# Patient Record
Sex: Female | Born: 1950
Health system: Southern US, Community
[De-identification: ages and names within clinical notes are randomized; demographics above are authoritative.]

## PROBLEM LIST (undated history)

## (undated) DIAGNOSIS — R011 Cardiac murmur, unspecified: Secondary | ICD-10-CM

## (undated) DIAGNOSIS — Z8701 Personal history of pneumonia (recurrent): Secondary | ICD-10-CM

## (undated) DIAGNOSIS — I739 Peripheral vascular disease, unspecified: Secondary | ICD-10-CM

## (undated) DIAGNOSIS — J309 Allergic rhinitis, unspecified: Secondary | ICD-10-CM

## (undated) DIAGNOSIS — J189 Pneumonia, unspecified organism: Secondary | ICD-10-CM

## (undated) DIAGNOSIS — E785 Hyperlipidemia, unspecified: Secondary | ICD-10-CM

## (undated) DIAGNOSIS — Z889 Allergy status to unspecified drugs, medicaments and biological substances status: Secondary | ICD-10-CM

## (undated) DIAGNOSIS — K219 Gastro-esophageal reflux disease without esophagitis: Secondary | ICD-10-CM

## (undated) HISTORY — DX: Cardiac murmur, unspecified: R01.1

## (undated) HISTORY — PX: ABDOMINAL HYSTERECTOMY: SHX81

## (undated) HISTORY — DX: Allergic rhinitis, unspecified: J30.9

## (undated) HISTORY — DX: Personal history of pneumonia (recurrent): Z87.01

## (undated) HISTORY — PX: OTHER SURGICAL HISTORY: SHX169

## (undated) HISTORY — PX: CHOLECYSTECTOMY: SHX55

## (undated) HISTORY — PX: OOPHORECTOMY: SHX86

---

## 2002-03-25 ENCOUNTER — Ambulatory Visit (HOSPITAL_COMMUNITY): Admission: RE | Admit: 2002-03-25 | Discharge: 2002-03-25 | Payer: Self-pay | Admitting: Otolaryngology

## 2002-03-25 ENCOUNTER — Encounter: Payer: Self-pay | Admitting: Otolaryngology

## 2002-08-15 ENCOUNTER — Ambulatory Visit (HOSPITAL_COMMUNITY): Admission: RE | Admit: 2002-08-15 | Discharge: 2002-08-15 | Payer: Self-pay | Admitting: Family Medicine

## 2002-08-15 ENCOUNTER — Encounter: Payer: Self-pay | Admitting: Family Medicine

## 2003-04-25 ENCOUNTER — Ambulatory Visit (HOSPITAL_COMMUNITY): Admission: RE | Admit: 2003-04-25 | Discharge: 2003-04-25 | Payer: Self-pay | Admitting: Family Medicine

## 2003-04-25 ENCOUNTER — Encounter: Payer: Self-pay | Admitting: Family Medicine

## 2003-06-06 ENCOUNTER — Emergency Department (HOSPITAL_COMMUNITY): Admission: EM | Admit: 2003-06-06 | Discharge: 2003-06-07 | Payer: Self-pay | Admitting: Emergency Medicine

## 2003-06-07 ENCOUNTER — Encounter: Payer: Self-pay | Admitting: Emergency Medicine

## 2003-06-09 ENCOUNTER — Encounter: Payer: Self-pay | Admitting: Sports Medicine

## 2003-06-09 ENCOUNTER — Ambulatory Visit (HOSPITAL_COMMUNITY): Admission: RE | Admit: 2003-06-09 | Discharge: 2003-06-09 | Payer: Self-pay | Admitting: Sports Medicine

## 2003-07-25 ENCOUNTER — Encounter (HOSPITAL_COMMUNITY): Admission: RE | Admit: 2003-07-25 | Discharge: 2003-08-24 | Payer: Self-pay | Admitting: *Deleted

## 2011-01-19 ENCOUNTER — Encounter: Payer: Self-pay | Admitting: Family Medicine

## 2011-06-05 ENCOUNTER — Encounter: Payer: Self-pay | Admitting: Internal Medicine

## 2011-06-05 ENCOUNTER — Ambulatory Visit (INDEPENDENT_AMBULATORY_CARE_PROVIDER_SITE_OTHER): Payer: 59 | Admitting: Internal Medicine

## 2011-06-05 VITALS — BP 160/80 | HR 86 | Temp 97.8°F | Ht 63.5 in | Wt 158.0 lb

## 2011-06-05 DIAGNOSIS — R509 Fever, unspecified: Secondary | ICD-10-CM | POA: Insufficient documentation

## 2011-06-05 LAB — CBC WITH DIFFERENTIAL/PLATELET
Basophils Absolute: 0 10*3/uL (ref 0.0–0.1)
Basophils Relative: 0 % (ref 0–1)
Eosinophils Absolute: 0.1 10*3/uL (ref 0.0–0.7)
Eosinophils Relative: 1 % (ref 0–5)
HCT: 45.6 % (ref 36.0–46.0)
Hemoglobin: 14.8 g/dL (ref 12.0–15.0)
Lymphocytes Relative: 24 % (ref 12–46)
Lymphs Abs: 2.9 10*3/uL (ref 0.7–4.0)
MCH: 30.2 pg (ref 26.0–34.0)
MCHC: 32.5 g/dL (ref 30.0–36.0)
MCV: 93.1 fL (ref 78.0–100.0)
Monocytes Absolute: 0.9 10*3/uL (ref 0.1–1.0)
Monocytes Relative: 7 % (ref 3–12)
Neutro Abs: 8.1 10*3/uL — ABNORMAL HIGH (ref 1.7–7.7)
Neutrophils Relative %: 68 % (ref 43–77)
Platelets: 327 10*3/uL (ref 150–400)
RBC: 4.9 MIL/uL (ref 3.87–5.11)
RDW: 12.8 % (ref 11.5–15.5)
WBC: 12 10*3/uL — ABNORMAL HIGH (ref 4.0–10.5)

## 2011-06-05 NOTE — Progress Notes (Signed)
  Subjective:    Patient ID: [name], female    DOB: 1951-09-10, 60 y.o.   MRN: [id number]  HPI 60 yo female with hx of GERD, H pylori, hyperlipidemia here with compliant of subjective fever, chills, sweating at night. All symptoms are new.  Does not take temperature.  Also c/o abdominal cramping for same time period.  No weight loss.  Had a tick bite at the time on leg with large red lesion and since has had spots on legs and arms that have been pruritic.  Has had a positive IgM for RMSF and completed a full course and is completing a second course.  Symptoms have had some improvement with Doxy, but not completely and returned.  No sick contacts, no travel.     Review of Systems  Constitutional: Positive for fever and chills. Negative for activity change, appetite change, fatigue and unexpected weight change.  HENT: Negative.  Negative for neck pain and neck stiffness.   Eyes: Negative.   Respiratory: Negative.   Cardiovascular: Negative.   Gastrointestinal: Positive for abdominal pain. Negative for nausea, vomiting, diarrhea, blood in stool and abdominal distention.  Genitourinary: Negative.   Musculoskeletal: Negative.   Neurological: Negative.   Hematological: Negative.   Psychiatric/Behavioral: Negative.        Objective:   Physical Exam  Constitutional: She is oriented to person, place, and time. She appears well-developed and well-nourished. No distress.  HENT:  Mouth/Throat: Oropharynx is clear and moist. No oropharyngeal exudate.  Eyes: Right eye exhibits no discharge. Left eye exhibits no discharge. No scleral icterus.  Neck: Normal range of motion. Neck supple.  Cardiovascular: Normal rate, regular rhythm and normal heart sounds.   No murmur heard. Pulmonary/Chest: Effort normal and breath sounds normal. No respiratory distress. She has no wheezes.  Abdominal: Soft. Bowel sounds are normal. She exhibits no distension. There is no tenderness. There is no rebound and no  guarding.  Musculoskeletal: Normal range of motion. She exhibits no edema.  Lymphadenopathy:    She has no cervical adenopathy.  Neurological: She is alert and oriented to person, place, and time. No cranial nerve deficit.  Skin: Skin is warm and dry.       + small petechial lesions on left arm, left leg. None on back, palms, soles.    Psychiatric: She has a normal mood and affect. Her behavior is normal.          Assessment & Plan:

## 2011-06-05 NOTE — Assessment & Plan Note (Signed)
Unclear etiology.  Patient is going to get a thermometer and check temp when she has a fever to see how high.  I will contact patient to find out.  I suspect she had a cellulitis at site of tick bites on leg (first) then abd because she picks/scratches them and describes a significant redness.  Petechial lesions unclear but possibly RMSF.  Pt has been treated adequately.  Other etiologies could be IBS for her abdominal cramping, less likely a lymphoma with night sweats and fever just at night.  Will check a CBC, LDH.  No epidemiology for TB.  Pt to follow up if no improvement.

## 2011-06-06 LAB — COMPLETE METABOLIC PANEL WITH GFR
ALT: 21 U/L (ref 0–35)
AST: 18 U/L (ref 0–37)
Albumin: 4.7 g/dL (ref 3.5–5.2)
Alkaline Phosphatase: 62 U/L (ref 39–117)
BUN: 10 mg/dL (ref 6–23)
CO2: 23 mEq/L (ref 19–32)
Calcium: 10.1 mg/dL (ref 8.4–10.5)
Chloride: 102 mEq/L (ref 96–112)
Creat: 0.63 mg/dL (ref 0.50–1.10)
GFR, Est African American: >60 mL/min (ref >60)
GFR, Est Non African American: >60 mL/min (ref >60)
Glucose, Bld: 97 mg/dL (ref 70–99)
Potassium: 5 mEq/L (ref 3.5–5.3)
Sodium: 137 mEq/L (ref 135–145)
Total Bilirubin: 0.4 mg/dL (ref 0.3–1.2)
Total Protein: 7 g/dL (ref 6.0–8.3)

## 2011-06-06 LAB — C-REACTIVE PROTEIN: CRP: 1.7 mg/dL — ABNORMAL HIGH (ref <0.6)

## 2011-06-09 ENCOUNTER — Telehealth: Payer: Self-pay | Admitting: *Deleted

## 2011-06-09 LAB — LACTATE DEHYDROGENASE, ISOENZYMES
LDH 1: 23 % (ref 19–38)
LDH 2: 34 % (ref 30–43)
LDH Isoenzymes, Total: 145 U/L (ref 120–250)

## 2011-06-09 NOTE — Telephone Encounter (Signed)
She called from [phone] asking for labs. Told her wbc & crp slightly elevated. Will check with Dr. Luciana Axe & call her back. States she is finsished with antibiotics

## 2011-11-26 ENCOUNTER — Emergency Department (HOSPITAL_COMMUNITY): Payer: 59

## 2011-11-26 ENCOUNTER — Encounter (HOSPITAL_COMMUNITY): Payer: Self-pay | Admitting: *Deleted

## 2011-11-26 ENCOUNTER — Emergency Department (HOSPITAL_COMMUNITY)
Admission: EM | Admit: 2011-11-26 | Discharge: 2011-11-27 | Disposition: A | Discharge status: Home / Self Care | Payer: 59 | Attending: Emergency Medicine | Admitting: Emergency Medicine

## 2011-11-26 DIAGNOSIS — I498 Other specified cardiac arrhythmias: Secondary | ICD-10-CM | POA: Insufficient documentation

## 2011-11-26 DIAGNOSIS — T7840XA Allergy, unspecified, initial encounter: Secondary | ICD-10-CM

## 2011-11-26 DIAGNOSIS — R197 Diarrhea, unspecified: Secondary | ICD-10-CM | POA: Insufficient documentation

## 2011-11-26 DIAGNOSIS — L539 Erythematous condition, unspecified: Secondary | ICD-10-CM | POA: Insufficient documentation

## 2011-11-26 DIAGNOSIS — R109 Unspecified abdominal pain: Secondary | ICD-10-CM | POA: Insufficient documentation

## 2011-11-26 DIAGNOSIS — F17200 Nicotine dependence, unspecified, uncomplicated: Secondary | ICD-10-CM | POA: Insufficient documentation

## 2011-11-26 DIAGNOSIS — I10 Essential (primary) hypertension: Secondary | ICD-10-CM | POA: Insufficient documentation

## 2011-11-26 DIAGNOSIS — N9489 Other specified conditions associated with female genital organs and menstrual cycle: Secondary | ICD-10-CM | POA: Insufficient documentation

## 2011-11-26 DIAGNOSIS — R112 Nausea with vomiting, unspecified: Secondary | ICD-10-CM | POA: Insufficient documentation

## 2011-11-26 DIAGNOSIS — L299 Pruritus, unspecified: Secondary | ICD-10-CM | POA: Insufficient documentation

## 2011-11-26 LAB — BASIC METABOLIC PANEL
BUN: 12 mg/dL (ref 6–23)
Calcium: 9.2 mg/dL (ref 8.4–10.5)
GFR calc non Af Amer: 69 mL/min — ABNORMAL LOW (ref >90)
Glucose, Bld: 125 mg/dL — ABNORMAL HIGH (ref 70–99)

## 2011-11-26 LAB — CBC
HCT: 41.4 % (ref 36.0–46.0)
Hemoglobin: 13.9 g/dL (ref 12.0–15.0)
MCH: 30.5 pg (ref 26.0–34.0)
MCHC: 33.6 g/dL (ref 30.0–36.0)

## 2011-11-26 LAB — DIFFERENTIAL
Basophils Relative: 0 % (ref 0–1)
Eosinophils Absolute: 0 10*3/uL (ref 0.0–0.7)
Eosinophils Relative: 0 % (ref 0–5)
Monocytes Absolute: 0.9 10*3/uL (ref 0.1–1.0)
Monocytes Relative: 4 % (ref 3–12)

## 2011-11-26 MED ORDER — METHYLPREDNISOLONE SODIUM SUCC 125 MG IJ SOLR
125.0000 mg | Freq: Once | INTRAMUSCULAR | Status: AC
  Administered 2011-11-26: 125 mg via INTRAVENOUS
  Filled 2011-11-26: qty 2

## 2011-11-26 MED ORDER — DIPHENHYDRAMINE HCL 50 MG/ML IJ SOLN
25.0000 mg | Freq: Once | INTRAMUSCULAR | Status: AC
  Administered 2011-11-26: 50 mg via INTRAVENOUS
  Filled 2011-11-26: qty 1

## 2011-11-26 MED ORDER — FAMOTIDINE IN NACL 20-0.9 MG/50ML-% IV SOLN
20.0000 mg | Freq: Once | INTRAVENOUS | Status: AC
  Administered 2011-11-26: 20 mg via INTRAVENOUS
  Filled 2011-11-26: qty 50

## 2011-11-26 MED ORDER — SODIUM CHLORIDE 0.9 % IV BOLUS (SEPSIS)
1000.0000 mL | Freq: Once | INTRAVENOUS | Status: AC
  Administered 2011-11-26: 1000 mL via INTRAVENOUS

## 2011-11-26 NOTE — ED Notes (Signed)
Pt red from head to toe no raised areas; c/o's itching; No resp distress noted

## 2011-11-26 NOTE — ED Provider Notes (Signed)
History    This chart was scribed for [employer]. Colon Branch, MD, MD by Smitty Pluck. The patient was seen in room APA14 and the patient's care was started at 10:06PM.   CSN: [id number] Arrival date & time: 11/26/2011  8:58 PM   First MD Initiated Contact with Patient 11/26/11 2100      Chief Complaint  Patient presents with  . Allergic Reaction    (Consider location/radiation/quality/duration/timing/severity/associated sxs/prior treatment) The history is provided by the patient and the spouse.   [name] is a 60 y.o. female who presents to the Emergency Department complaining of moderate to severe allergic reaction with sudden onset several hours ago and improving since. Pt reports that symptoms started after eating [employer] and hot wings. She mentions being nauseated with vomiting for 30 minutes, and becoming diffusely flush, SOB, dizzy and experiencing diarrhea, pruritus and numbness followed by a syncopal episode in which patient struck the left side of her head on the commode as she fell. She reports no change in diet or recent new exposures. Pt denies hives. States she had [facility] Spotted fever earlier this year. Also, she says she is allergic to red dye, levaquin and biaxin.   History reviewed. No pertinent past medical history.  History reviewed. No pertinent past surgical history.  No family history on file.  History  Substance Use Topics  . Smoking status: Current Everyday Smoker -- 2.0 packs/day for 20 years    Types: Cigarettes  . Smokeless tobacco: Never Used  . Alcohol Use: No    OB History    Grav Para Term Preterm Abortions TAB SAB Ect Mult Living                  Review of Systems  All other systems reviewed and are negative.  10 Systems reviewed and are negative for acute change except as noted in the HPI.   Allergies  Biaxin; Levaquin; and Red dye  Home Medications   Current Outpatient Rx  Name Route Sig Dispense Refill  .  CETIRIZINE HCL 10 MG PO TABS Oral Take 10 mg by mouth at bedtime.     Marland Kitchen NAPROXEN 500 MG PO TABS Oral Take 500 mg by mouth daily as needed. For knee pain     . ATORVASTATIN CALCIUM 20 MG PO TABS Oral Take 20 mg by mouth daily.      Marland Kitchen DOXYCYCLINE HYCLATE 100 MG PO TABS Oral Take 100 mg by mouth 2 (two) times daily.      Marland Kitchen MUPIROCIN 2 % EX OINT      . SIMVASTATIN 20 MG PO TABS        BP 92/52  Pulse 92  Resp 20  SpO2 91%  Physical Exam  Nursing note and vitals reviewed. Constitutional: She is oriented to person, place, and time. She appears well-developed and well-nourished. No distress.  HENT:  Head: Normocephalic and atraumatic.  Right Ear: External ear normal.  Left Ear: External ear normal.  Eyes: EOM are normal. Pupils are equal, round, and reactive to light.  Neck: Normal range of motion. Neck supple. No tracheal deviation present.  Cardiovascular: Normal rate, regular rhythm and normal heart sounds.  Exam reveals no friction rub.   No murmur heard. Pulmonary/Chest: Effort normal. She has wheezes.       Basilar crackles  Occasional end expiratory wheeze  Abdominal: Soft. There is no tenderness.       Hyperactive bowel sounds  Neurological: She is alert and  oriented to person, place, and time.  Skin: Skin is warm and dry.       Diffusely erythema  all over body with no hives  Psychiatric: She has a normal mood and affect. Her behavior is normal.    ED Course  Procedures (including critical care time)  DIAGNOSTIC STUDIES:  Date: 11/26/2011 2054  Rate: 112  Rhythm: sinus tachycardia  QRS Axis: normal  Intervals: normal  ST/T Wave abnormalities: normal  Conduction Disutrbances:none  Narrative Interpretation:   Old EKG Reviewed: none available Results for orders placed during the hospital encounter of 11/26/11  URINALYSIS, ROUTINE W REFLEX MICROSCOPIC      Component Value Range   Color, Urine AMBER (*) YELLOW    APPearance CLOUDY (*) CLEAR    Specific Gravity, Urine  1.010  1.005 - 1.030    pH 6.0  5.0 - 8.0    Glucose, UA NEGATIVE  NEGATIVE (mg/dL)   Hgb urine dipstick TRACE (*) NEGATIVE    Bilirubin Urine NEGATIVE  NEGATIVE    Ketones, ur NEGATIVE  NEGATIVE (mg/dL)   Protein, ur 161 (*) NEGATIVE (mg/dL)   Urobilinogen, UA 0.2  0.0 - 1.0 (mg/dL)   Nitrite NEGATIVE  NEGATIVE    Leukocytes, UA NEGATIVE  NEGATIVE   CBC      Component Value Range   WBC 27.1 (*) 4.0 - 10.5 (K/uL)   RBC 4.56  3.87 - 5.11 (MIL/uL)   Hemoglobin 13.9  12.0 - 15.0 (g/dL)   HCT 09.6  04.5 - 40.9 (%)   MCV 90.8  78.0 - 100.0 (fL)   MCH 30.5  26.0 - 34.0 (pg)   MCHC 33.6  30.0 - 36.0 (g/dL)   RDW 81.1  91.4 - 78.2 (%)   Platelets 296  150 - 400 (K/uL)  DIFFERENTIAL      Component Value Range   Neutrophils Relative 88 (*) 43 - 77 (%)   Neutro Abs 23.8 (*) 1.7 - 7.7 (K/uL)   Lymphocytes Relative 8 (*) 12 - 46 (%)   Lymphs Abs 2.3  0.7 - 4.0 (K/uL)   Monocytes Relative 4  3 - 12 (%)   Monocytes Absolute 0.9  0.1 - 1.0 (K/uL)   Eosinophils Relative 0  0 - 5 (%)   Eosinophils Absolute 0.0  0.0 - 0.7 (K/uL)   Basophils Relative 0  0 - 1 (%)   Basophils Absolute 0.0  0.0 - 0.1 (K/uL)   WBC Morphology INCREASED BANDS (>20% BANDS)    BASIC METABOLIC PANEL      Component Value Range   Sodium 139  135 - 145 (mEq/L)   Potassium 3.3 (*) 3.5 - 5.1 (mEq/L)   Chloride 106  96 - 112 (mEq/L)   CO2 24  19 - 32 (mEq/L)   Glucose, Bld 125 (*) 70 - 99 (mg/dL)   BUN 12  6 - 23 (mg/dL)   Creatinine, Ser 9.56  0.50 - 1.10 (mg/dL)   Calcium 9.2  8.4 - 21.3 (mg/dL)   GFR calc non Af Amer 69 (*) >90 (mL/min)   GFR calc Af Amer 81 (*) >90 (mL/min)  URINE MICROSCOPIC-ADD ON      Component Value Range   Squamous Epithelial / LPF FEW (*) RARE    WBC, UA 0-2  <3 (WBC/hpf)   RBC / HPF 3-6  <3 (RBC/hpf)   Bacteria, UA FEW (*) RARE    Casts HYALINE CASTS (*) NEGATIVE      COORDINATION OF CARE: 2300 Patient has  had a liter of IVF, benadryl, pepcid and solumedrol. Erythema has resolved.  Now with cramping abdominal pain. Labs with marked leukocytosis that is not c/w allergic reaction. With the h/o nausea, vomiting and diarrhea, will order a CT . 0040 Care/dispostion to Dr. Juleen China.    MDM  Patient who presents with nausea, vomiting, erythema, pruritis, diarrhea after eating a meal she has had before. Labs with a marked leukocytosis. Treated for allergic reaction with IVF, solumedrol, pepcid and benadryl with resolution of the erythema and pruritis. No further nausea, vomiting or diarrhea.  CT ordered and pending.  I personally performed the services described in this documentation, which was scribed in my presence. The recorded information has been reviewed and considered.  MDM Reviewed: nursing note and vitals Interpretation: labs Total time providing critical care: 40.       Nicoletta Dress. Colon Branch, MD 11/27/11 4098

## 2011-11-26 NOTE — ED Notes (Signed)
Patient states she ate at [employer] at 1800 and at 7pm, began vomiting and becoming red.

## 2011-11-27 ENCOUNTER — Emergency Department (HOSPITAL_COMMUNITY): Payer: 59

## 2011-11-27 ENCOUNTER — Encounter (HOSPITAL_COMMUNITY): Payer: Self-pay | Admitting: Emergency Medicine

## 2011-11-27 LAB — URINALYSIS, ROUTINE W REFLEX MICROSCOPIC
Bilirubin Urine: NEGATIVE
Glucose, UA: NEGATIVE mg/dL
Ketones, ur: NEGATIVE mg/dL
Nitrite: NEGATIVE
pH: 6 (ref 5.0–8.0)

## 2011-11-27 LAB — CA 125: CA 125: 7 U/mL (ref 0.0–30.2)

## 2011-11-27 LAB — HEPATIC FUNCTION PANEL
ALT: 17 U/L (ref 0–35)
Albumin: 3.4 g/dL — ABNORMAL LOW (ref 3.5–5.2)
Alkaline Phosphatase: 61 U/L (ref 39–117)
Total Protein: 6.5 g/dL (ref 6.0–8.3)

## 2011-11-27 LAB — LIPASE, BLOOD: Lipase: 47 U/L (ref 11–59)

## 2011-11-27 MED ORDER — IOHEXOL 300 MG/ML  SOLN
100.0000 mL | Freq: Once | INTRAMUSCULAR | Status: AC | PRN
  Administered 2011-11-27: 100 mL via INTRAVENOUS

## 2011-11-27 MED ORDER — PROMETHAZINE HCL 25 MG PO TABS: 25.0000 mg | ORAL_TABLET | Freq: Four times a day (QID) | ORAL | Status: AC | PRN

## 2011-11-27 NOTE — ED Provider Notes (Signed)
4:42 AM Discussed CT results with pt including adnexal mass and concern for malignancy. Marked leukocytosis with >20% bands. This is concerning given pt's presentation, mild hypotension, hypoxia, etc. Pt reports back to her baseline and has been observed in ED for several hours with improvement. Would expect worsening or at least continued symptoms at this point if from infectious cause.  Leukocytosis may be related to pelvic mass but not sure if this is to be expected with a solid tumor. Will discuss with hem/onc for further advice.  5:11 AM Discussed with Dr Kendall Flack. Says that this degree of neutrophilia can be seen with solid tumors and that if clinical picture does not fit with infectious process then is likely cause. Pt and singificant other given strict DC instructions and what to return for immediate re-evaluation. GYN referral. CA-125 sent for benefit of follow-up provider.  Raeford Razor, MD 11/29/11 1145

## 2011-12-08 DIAGNOSIS — D4959 Neoplasm of unspecified behavior of other genitourinary organ: Secondary | ICD-10-CM | POA: Insufficient documentation

## 2011-12-12 DIAGNOSIS — J309 Allergic rhinitis, unspecified: Secondary | ICD-10-CM | POA: Insufficient documentation

## 2011-12-12 DIAGNOSIS — Z720 Tobacco use: Secondary | ICD-10-CM | POA: Insufficient documentation

## 2011-12-12 DIAGNOSIS — R0683 Snoring: Secondary | ICD-10-CM | POA: Insufficient documentation

## 2011-12-12 DIAGNOSIS — Z8719 Personal history of other diseases of the digestive system: Secondary | ICD-10-CM | POA: Insufficient documentation

## 2011-12-17 DIAGNOSIS — N9489 Other specified conditions associated with female genital organs and menstrual cycle: Secondary | ICD-10-CM | POA: Insufficient documentation

## 2012-01-22 DIAGNOSIS — D279 Benign neoplasm of unspecified ovary: Secondary | ICD-10-CM | POA: Insufficient documentation

## 2012-10-07 IMAGING — CT CT ABD-PELV W/ CM
2 of 5 series · 15 of 46 positions shown, 17 images · IV contrast (Omnipaque 300)
Comparison: None.

CLINICAL DATA: Abdominal pain, nausea, vomiting, flushing,
shortness of breath, dizziness, diarrhea and pruritis; syncope.
History of smoking.

CT ABDOMEN AND PELVIS WITH CONTRAST
TECHNIQUE: Multidetector CT imaging of the abdomen and pelvis was
performed following the standard protocol during bolus
administration of intravenous contrast.
Contrast: 100mL OMNIPAQUE IOHEXOL 300 MG/ML IV SOLN

[Series 2: abd_pel_with 5.0 b40f · axial · 0.66mm/px · z∈[-475,-80]mm · 12 of 91 slices shown, 14 images]
[im 6/91  soft-tissue]
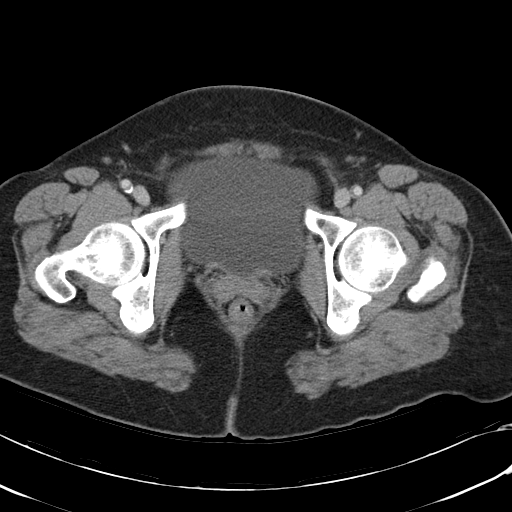
[im 6/91  bone]
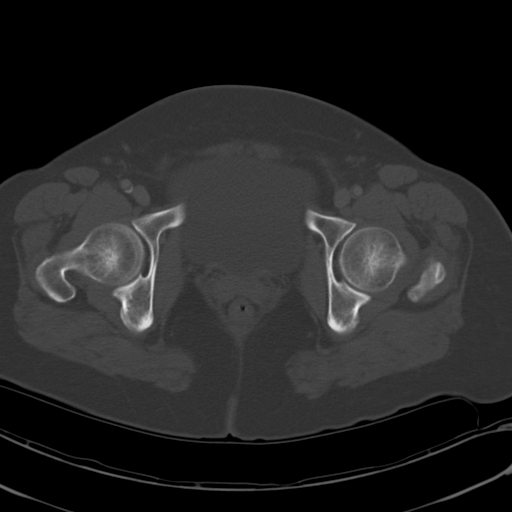
[im 16/91  soft-tissue]
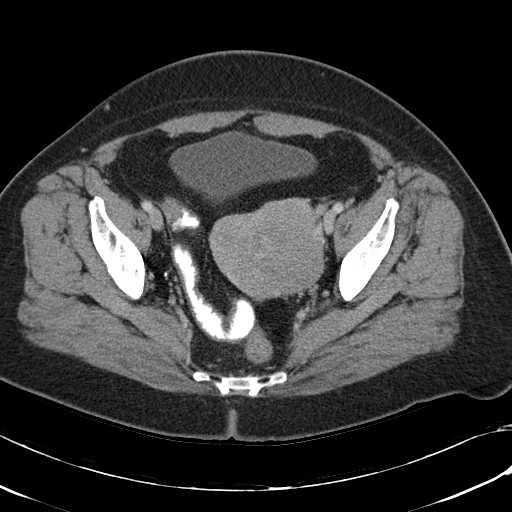
[im 22/91  soft-tissue]
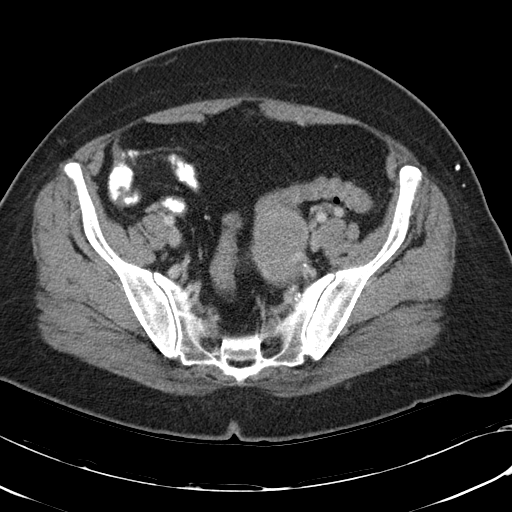
[im 27/91  soft-tissue]
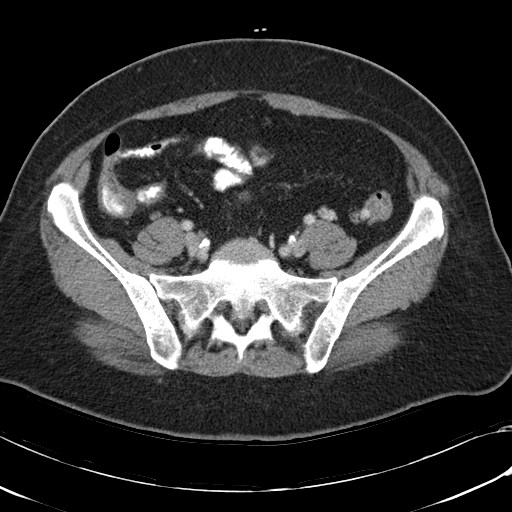
[im 38/91  soft-tissue]
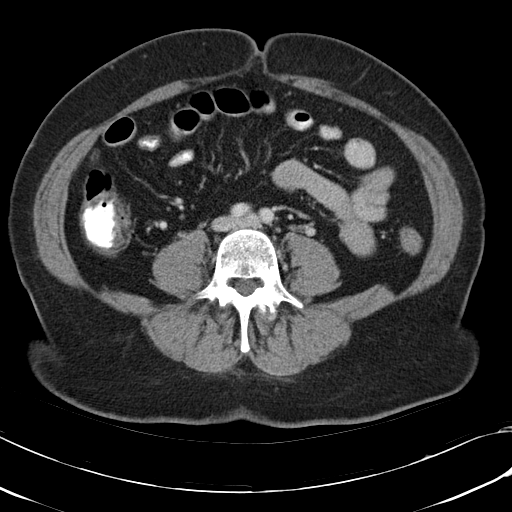
[im 43/91  soft-tissue]
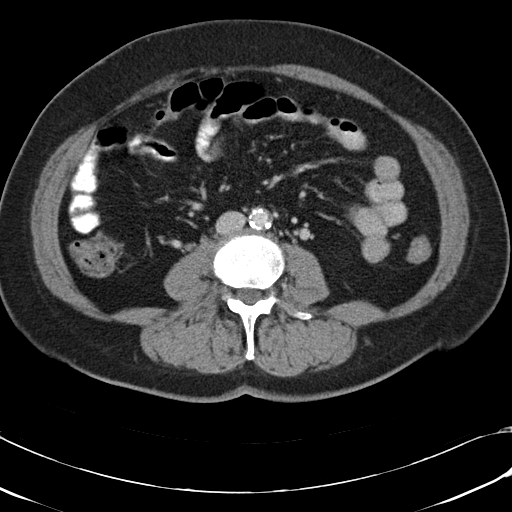
[im 48/91  soft-tissue]
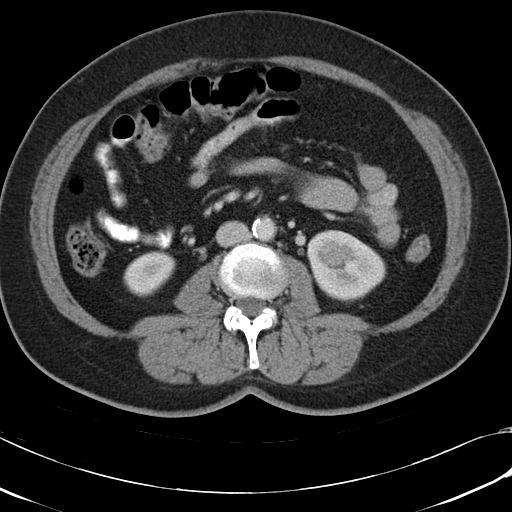
[im 59/91  soft-tissue]
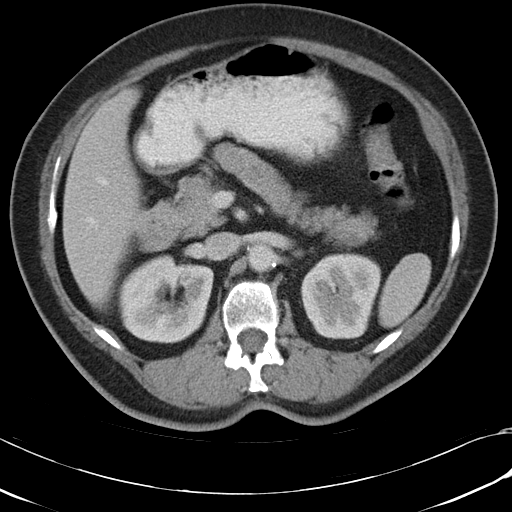
[im 64/91  soft-tissue]
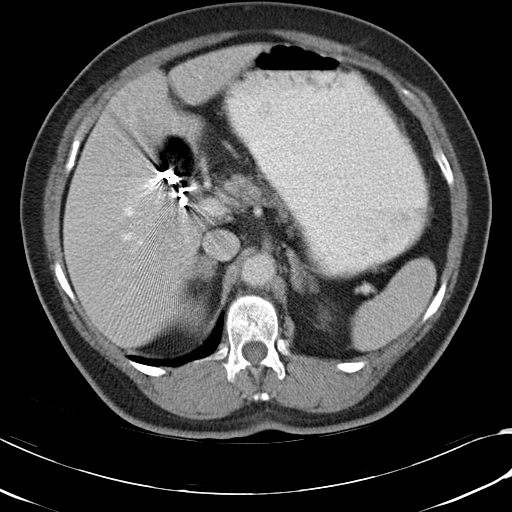
[im 64/91  bone]
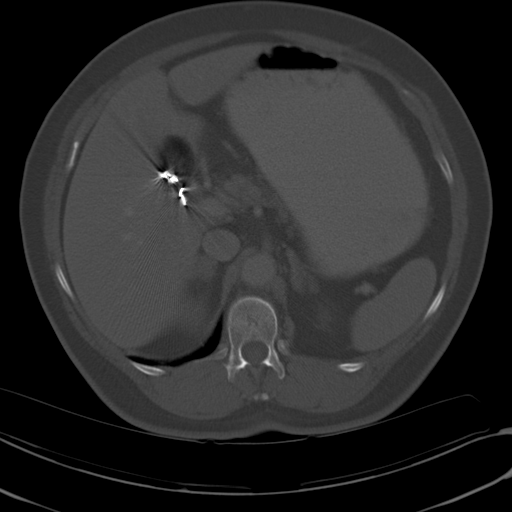
[im 69/91  soft-tissue]
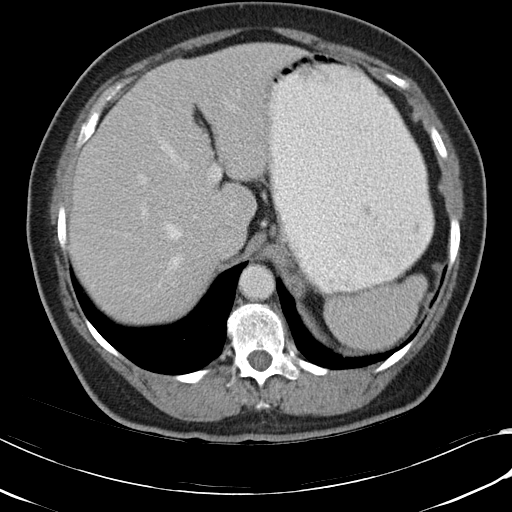
[im 80/91  soft-tissue]
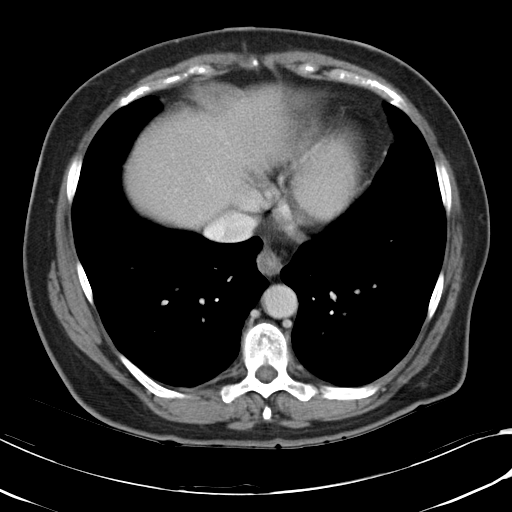
[im 85/91  soft-tissue]
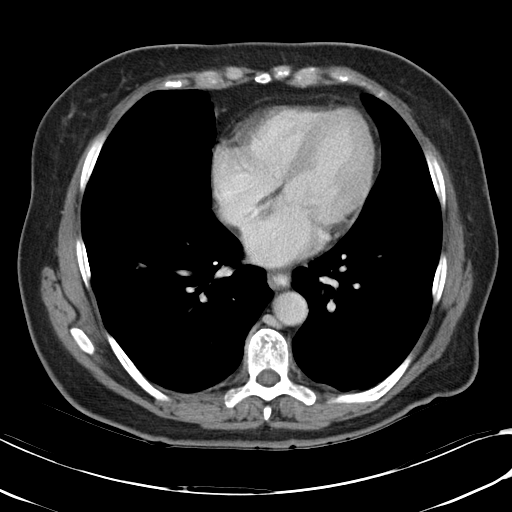

[Series 4: abd_pel_with 3.0 spo cor · coronal · 0.66mm/px · 3 of 95 slices shown]
[im 32/95  soft-tissue]
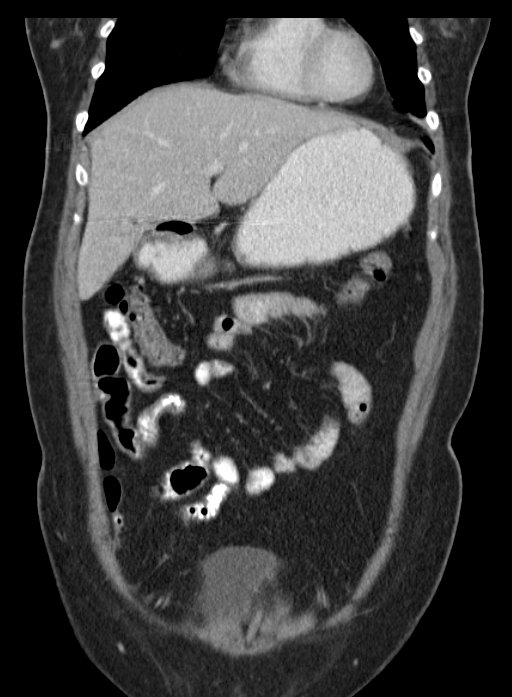
[im 42/95  soft-tissue]
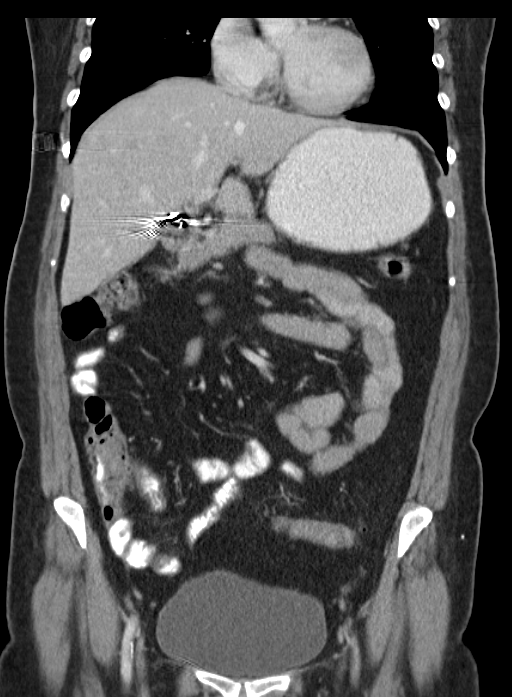
[im 53/95  soft-tissue]
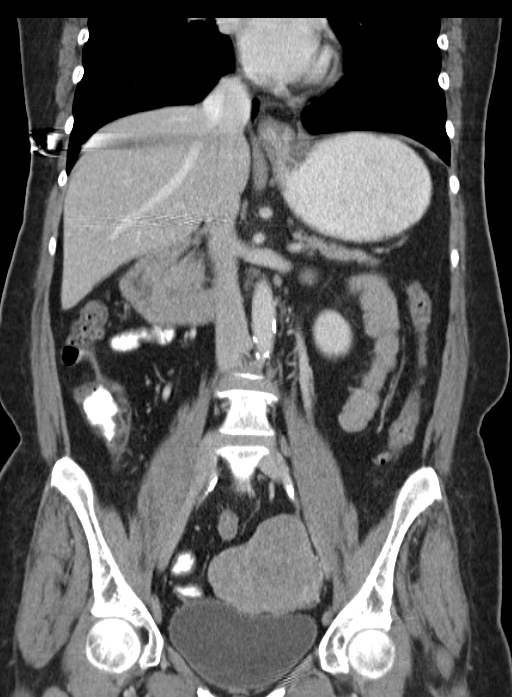

[15 of 46 positions shown; findings below may reference images not displayed]

FINDINGS: The visualized lung bases are clear.

The liver and spleen are unremarkable in appearance.  The patient
is status post cholecystectomy, with clips noted at the gallbladder
fossa.  A 1.8 cm mass is noted at the right adrenal gland; this
demonstrates greater than 50% relative washout on delayed imaging,
most likely reflecting an adrenal adenoma.

The pancreas is unremarkable in appearance.  The left adrenal gland
is within normal limits.

The kidneys are unremarkable in appearance.  There is no evidence
of hydronephrosis.  No renal or ureteral stones are seen.  No
perinephric stranding is appreciated.

The small bowel is unremarkable in appearance.  The stomach is
within normal limits.  No acute vascular abnormalities are seen.
Mild scattered calcification is noted along the abdominal aorta and
its branches.

The patient is status post appendectomy.  Scattered diverticulosis
is noted along the distal descending and proximal sigmoid colon,
without evidence of diverticulitis.  The colon is otherwise
unremarkable in appearance.

The bladder is moderately distended and grossly unremarkable in
appearance.

There is a large solid heterogeneous mass arising at the left
adnexa, measuring 7.4 x 5.6 x 6.5 cm, concerning for malignancy.
The right ovary is unremarkable in appearance.  The patient is
status post hysterectomy.  Trace free fluid within the pelvic cul-
de-sac may be physiologic in nature.  No inguinal lymphadenopathy
is seen.

No acute osseous abnormalities are identified.
IMPRESSION: 1.  Large solid heterogeneous left adnexal mass noted, measuring
7.4 x 5.6 x 6.5 cm, concerning for ovarian malignancy.
2.  Scattered diverticulosis along the distal descending proximal
sigmoid colon, without evidence of diverticulitis.
3.  Mild scattered calcification along the abdominal aorta and its
branches.
4.  1.8 cm right adrenal mass demonstrates greater than 50%
relative washout on delayed imaging, most likely reflecting a
benign adrenal adenoma.

## 2015-08-14 ENCOUNTER — Other Ambulatory Visit (HOSPITAL_COMMUNITY): Payer: Self-pay | Admitting: Internal Medicine

## 2015-08-14 ENCOUNTER — Ambulatory Visit (HOSPITAL_COMMUNITY)
Admission: RE | Admit: 2015-08-14 | Discharge: 2015-08-14 | Disposition: A | Discharge status: Home / Self Care | Payer: PPO | Source: Ambulatory Visit | Attending: Internal Medicine | Admitting: Internal Medicine

## 2015-08-14 DIAGNOSIS — Z1389 Encounter for screening for other disorder: Secondary | ICD-10-CM

## 2015-08-14 DIAGNOSIS — M25552 Pain in left hip: Secondary | ICD-10-CM | POA: Diagnosis not present

## 2015-08-22 ENCOUNTER — Other Ambulatory Visit (HOSPITAL_COMMUNITY): Payer: Self-pay | Admitting: Physician Assistant

## 2015-08-22 DIAGNOSIS — Z139 Encounter for screening, unspecified: Secondary | ICD-10-CM

## 2015-08-29 ENCOUNTER — Ambulatory Visit (HOSPITAL_COMMUNITY): Payer: PPO

## 2015-09-05 ENCOUNTER — Ambulatory Visit (HOSPITAL_COMMUNITY): Payer: PPO

## 2015-10-10 ENCOUNTER — Ambulatory Visit (HOSPITAL_COMMUNITY): Payer: PPO | Attending: Physical Medicine and Rehabilitation | Admitting: Physical Therapy

## 2015-10-10 DIAGNOSIS — M533 Sacrococcygeal disorders, not elsewhere classified: Secondary | ICD-10-CM | POA: Diagnosis present

## 2015-10-10 DIAGNOSIS — M25651 Stiffness of right hip, not elsewhere classified: Secondary | ICD-10-CM | POA: Insufficient documentation

## 2015-10-10 DIAGNOSIS — M25652 Stiffness of left hip, not elsewhere classified: Secondary | ICD-10-CM | POA: Diagnosis present

## 2015-10-10 DIAGNOSIS — M79605 Pain in left leg: Secondary | ICD-10-CM | POA: Diagnosis present

## 2015-10-10 DIAGNOSIS — M6289 Other specified disorders of muscle: Secondary | ICD-10-CM | POA: Insufficient documentation

## 2015-10-10 DIAGNOSIS — R198 Other specified symptoms and signs involving the digestive system and abdomen: Secondary | ICD-10-CM

## 2015-10-10 DIAGNOSIS — M6281 Muscle weakness (generalized): Secondary | ICD-10-CM
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
## 2015-10-10 NOTE — Patient Instructions (Signed)
   BRIDGING  While lying on your back, tighten your lower abdominals, squeeze your buttocks and then raise your buttocks off the floor/bed as creating a "Bridge" with your body.  Repeat 10 times, 2x/day.    STRAIGHT LEG RAISE - SLR  While lying or sitting, raise up your leg with a straight knee.  Keep the opposite knee bent with the foot planted to the ground.  Repeat 10 times each leg, 2x/day.    HIP ABDUCTION - SIDELYING  While lying on your side, slowly raise up your top leg to the side. Keep your knee straight and maintain your toes pointed forward the entire time.   The bottom leg can be bent to stabilize your body.  Repeat 10 times each leg, 2x/day.    PIRIFORMIS AND HIP STRETCH - SEATED  While sitting in a chair, cross your affected leg on top of the other as shown.   Next, gently lean forward until a stretch is felt along the crossed leg.  Repeat 3 times each leg with 30 second holds, 2x/day.

## 2015-10-22 ENCOUNTER — Ambulatory Visit (HOSPITAL_COMMUNITY): Payer: PPO | Admitting: Physical Therapy

## 2015-11-05 ENCOUNTER — Ambulatory Visit (HOSPITAL_COMMUNITY): Payer: PPO | Attending: Physical Medicine and Rehabilitation | Admitting: Physical Therapy

## 2015-11-19 ENCOUNTER — Ambulatory Visit (HOSPITAL_COMMUNITY): Payer: PPO | Admitting: Physical Therapy

## 2015-11-19 ENCOUNTER — Telehealth (HOSPITAL_COMMUNITY): Payer: Self-pay | Admitting: Physical Therapy

## 2015-11-19 NOTE — Telephone Encounter (Signed)
Patient a no-show for today's session. She reports that she forgot session but also states that her MD found that she has DDD in her spine which they think might be contributing to her leg pain, and that she is being sent to a back doctor to find out what her options are. She would like to hold PT for now, and is OK with being discharged for now; she was educated that she will need new MD order to resume skilled PT services.  Nedra HaiKristen Unger PT, DPT [phone]

## 2015-11-19 NOTE — Therapy (Signed)
[place], [place], [address] Phone: [phone]   Fax:  [phone]  Patient Details  Name: [name] MRN: [id number] Date of Birth: 06/05/1951 Referring Provider:  Suella Broad, MD  Encounter Date: 11/19/2015  PHYSICAL THERAPY DISCHARGE SUMMARY  Visits from Start of Care: 1  Current functional level related to goals / functional outcomes: Patient reports that she would like to be discharged from PT for now as MD found DDD in her back and she'd rather just wait and see the back MD before she does anything else.    Remaining deficits: Unable to assess    Education / Equipment: Will need new MD order to restart skilled PT services, as she is being discharged today Plan: Patient agrees to discharge.  Patient goals were not met. Patient is being discharged due to being pleased with the current functional level.  ?????       Deniece Ree PT, DPT [facility] [address] [place], [place], [address] Phone: [phone]   Fax:  [phone]

## 2015-11-26 ENCOUNTER — Other Ambulatory Visit (HOSPITAL_COMMUNITY): Payer: Self-pay | Admitting: Specialist

## 2015-11-26 DIAGNOSIS — M533 Sacrococcygeal disorders, not elsewhere classified: Secondary | ICD-10-CM

## 2015-11-30 ENCOUNTER — Other Ambulatory Visit (HOSPITAL_COMMUNITY): Payer: Self-pay | Admitting: Specialist

## 2015-11-30 DIAGNOSIS — I739 Peripheral vascular disease, unspecified: Secondary | ICD-10-CM

## 2015-12-03 ENCOUNTER — Encounter (HOSPITAL_COMMUNITY): Payer: PPO | Admitting: Physical Therapy

## 2015-12-03 ENCOUNTER — Ambulatory Visit (HOSPITAL_COMMUNITY)
Admission: RE | Admit: 2015-12-03 | Discharge: 2015-12-03 | Disposition: A | Discharge status: Home / Self Care | Payer: PPO | Source: Ambulatory Visit | Attending: Specialist | Admitting: Specialist

## 2015-12-03 DIAGNOSIS — F17200 Nicotine dependence, unspecified, uncomplicated: Secondary | ICD-10-CM | POA: Diagnosis not present

## 2015-12-03 DIAGNOSIS — Z01818 Encounter for other preprocedural examination: Secondary | ICD-10-CM | POA: Insufficient documentation

## 2015-12-03 DIAGNOSIS — I739 Peripheral vascular disease, unspecified: Secondary | ICD-10-CM | POA: Insufficient documentation

## 2015-12-17 ENCOUNTER — Ambulatory Visit: Payer: Self-pay | Admitting: Orthopedic Surgery

## 2015-12-18 NOTE — Patient Instructions (Addendum)
[name]  12/18/2015   Your procedure is scheduled on: 12/25/2015    [facility]  Entrance take [place]  elevators to 3rd floor to  Short Stay Center at    0530 AM.  Call this number if you have problems the morning of surgery [phone]   Remember: ONLY 1 PERSON MAY GO WITH YOU TO SHORT STAY TO GET  READY MORNING OF YOUR SURGERY.  Do not eat food or drink liquids :After Midnight.     Take these medicines the morning of surgery with A SIP OF WATER: Hydrocodone if needed,  Protonix, nasacort Inhaler if needed                                 You may not have any metal on your body including hair pins and              piercings  Do not wear jewelry, make-up, lotions, powders or perfumes, deodorant             Do not wear nail polish.  Do not shave  48 hours prior to surgery.               Do not bring valuables to the hospital. [facility] IS NOT             RESPONSIBLE   FOR VALUABLES.  Contacts, dentures or bridgework may not be worn into surgery.  Leave suitcase in the car. After surgery it may be brought to your room.     Special Instructions: coughing and deep breathing exercises, leg exercises               Please read over the following fact sheets you were given: _____________________________________________________________________             [facility] - Preparing [facility] Before surgery, you can play an important role.  Because skin is not sterile, your skin needs to be as free of germs as possible.  You can reduce the number of germs on your skin by washing with CHG (chlorahexidine gluconate) soap before surgery.  CHG is an antiseptic cleaner which kills germs and bonds with the skin to continue killing germs even after washing. Please DO NOT use if you have an allergy to CHG or antibacterial soaps.  If your skin becomes reddened/irritated stop using the CHG and inform your nurse when you arrive at Short Stay. Do not  shave (including legs and underarms) for at least 48 hours prior to the first CHG shower.  You may shave your face/neck. Please follow these instructions carefully:  1.  Shower with CHG Soap the night before surgery and the  morning of Surgery.  2.  If you choose to wash your hair, wash your hair first as usual with your  normal  shampoo.  3.  After you shampoo, rinse your hair and body thoroughly to remove the  shampoo.                           4.  Use CHG as you would any other liquid soap.  You can apply chg directly  to the skin and wash  Gently with a scrungie or clean washcloth.  5.  Apply the CHG Soap to your body ONLY FROM THE NECK DOWN.   Do not use on face/ open                           Wound or open sores. Avoid contact with eyes, ears mouth and genitals (private parts).                       Wash face,  Genitals (private parts) with your normal soap.             6.  Wash thoroughly, paying special attention to the area where your surgery  will be performed.  7.  Thoroughly rinse your body with warm water from the neck down.  8.  DO NOT shower/wash with your normal soap after using and rinsing off  the CHG Soap.                9.  Pat yourself dry with a clean towel.            10.  Wear clean pajamas.            11.  Place clean sheets on your bed the night of your first shower and do not  sleep with pets. Day of Surgery : Do not apply any lotions/deodorants the morning of surgery.  Please wear clean clothes to the hospital/surgery center.  FAILURE TO FOLLOW THESE INSTRUCTIONS MAY RESULT IN THE CANCELLATION OF YOUR SURGERY PATIENT SIGNATURE_________________________________  NURSE SIGNATURE__________________________________  ________________________________________________________________________   Adam Phenix  An incentive spirometer is a tool that can help keep your lungs clear and active. This tool measures how well you are filling your lungs  with each breath. Taking long deep breaths may help reverse or decrease the chance of developing breathing (pulmonary) problems (especially infection) following:  A long period of time when you are unable to move or be active. BEFORE THE PROCEDURE   If the spirometer includes an indicator to show your best effort, your nurse or respiratory therapist will set it to a desired goal.  If possible, sit up straight or lean slightly forward. Try not to slouch.  Hold the incentive spirometer in an upright position. INSTRUCTIONS FOR USE  1. Sit on the edge of your bed if possible, or sit up as far as you can in bed or on a chair. 2. Hold the incentive spirometer in an upright position. 3. Breathe out normally. 4. Place the mouthpiece in your mouth and seal your lips tightly around it. 5. Breathe in slowly and as deeply as possible, raising the piston or the ball toward the top of the column. 6. Hold your breath for 3-5 seconds or for as long as possible. Allow the piston or ball to fall to the bottom of the column. 7. Remove the mouthpiece from your mouth and breathe out normally. 8. Rest for a few seconds and repeat Steps 1 through 7 at least 10 times every 1-2 hours when you are awake. Take your time and take a few normal breaths between deep breaths. 9. The spirometer may include an indicator to show your best effort. Use the indicator as a goal to work toward during each repetition. 10. After each set of 10 deep breaths, practice coughing to be sure your lungs are clear. If you have an incision (the cut made at the time of surgery),  support your incision when coughing by placing a pillow or rolled up towels firmly against it. Once you are able to get out of bed, walk around indoors and cough well. You may stop using the incentive spirometer when instructed by your caregiver.  RISKS AND COMPLICATIONS  Take your time so you do not get dizzy or light-headed.  If you are in pain, you may need to  take or ask for pain medication before doing incentive spirometry. It is harder to take a deep breath if you are having pain. AFTER USE  Rest and breathe slowly and easily.  It can be helpful to keep track of a log of your progress. Your caregiver can provide you with a simple table to help with this. If you are using the spirometer at home, follow these instructions: [place] IF:   You are having difficultly using the spirometer.  You have trouble using the spirometer as often as instructed.  Your pain medication is not giving enough relief while using the spirometer.  You develop fever of 100.5 F (38.1 C) or higher. SEEK IMMEDIATE MEDICAL CARE IF:   You cough up bloody sputum that had not been present before.  You develop fever of 102 F (38.9 C) or greater.  You develop worsening pain at or near the incision site. MAKE SURE YOU:   Understand these instructions.  Will watch your condition.  Will get help right away if you are not doing well or get worse. Document Released: 04/27/2007 Document Revised: 03/08/2012 Document Reviewed: 06/28/2007 [facility] Patient Information 2014 [place], [place].   ________________________________________________________________________

## 2015-12-19 ENCOUNTER — Encounter (HOSPITAL_COMMUNITY): Payer: Self-pay

## 2015-12-19 ENCOUNTER — Encounter (HOSPITAL_COMMUNITY)
Admission: RE | Admit: 2015-12-19 | Discharge: 2015-12-19 | Disposition: A | Discharge status: Home / Self Care | Payer: PPO | Source: Ambulatory Visit | Attending: Specialist | Admitting: Specialist

## 2015-12-19 ENCOUNTER — Ambulatory Visit (HOSPITAL_COMMUNITY)
Admission: RE | Admit: 2015-12-19 | Discharge: 2015-12-19 | Disposition: A | Discharge status: Home / Self Care | Payer: PPO | Source: Ambulatory Visit | Attending: Orthopedic Surgery | Admitting: Orthopedic Surgery

## 2015-12-19 ENCOUNTER — Encounter (INDEPENDENT_AMBULATORY_CARE_PROVIDER_SITE_OTHER): Payer: Self-pay

## 2015-12-19 DIAGNOSIS — M4186 Other forms of scoliosis, lumbar region: Secondary | ICD-10-CM | POA: Diagnosis not present

## 2015-12-19 DIAGNOSIS — Z01818 Encounter for other preprocedural examination: Secondary | ICD-10-CM | POA: Insufficient documentation

## 2015-12-19 DIAGNOSIS — I700 Atherosclerosis of aorta: Secondary | ICD-10-CM | POA: Insufficient documentation

## 2015-12-19 DIAGNOSIS — M48061 Spinal stenosis, lumbar region without neurogenic claudication: Secondary | ICD-10-CM

## 2015-12-19 HISTORY — DX: Peripheral vascular disease, unspecified: I73.9

## 2015-12-19 HISTORY — DX: Pneumonia, unspecified organism: J18.9

## 2015-12-19 HISTORY — DX: Gastro-esophageal reflux disease without esophagitis: K21.9

## 2015-12-19 LAB — BASIC METABOLIC PANEL
ANION GAP: 11 (ref 5–15)
BUN: 12 mg/dL (ref 6–20)
CHLORIDE: 105 mmol/L (ref 101–111)
CO2: 24 mmol/L (ref 22–32)
Calcium: 9.6 mg/dL (ref 8.9–10.3)
Creatinine, Ser: 0.6 mg/dL (ref 0.44–1.00)
GFR calc Af Amer: >60 mL/min (ref >60)
Glucose, Bld: 104 mg/dL — ABNORMAL HIGH (ref 65–99)
POTASSIUM: 4.6 mmol/L (ref 3.5–5.1)
SODIUM: 140 mmol/L (ref 135–145)

## 2015-12-19 LAB — CBC
HEMATOCRIT: 42.7 % (ref 36.0–46.0)
HEMOGLOBIN: 14 g/dL (ref 12.0–15.0)
MCH: 29.4 pg (ref 26.0–34.0)
MCHC: 32.8 g/dL (ref 30.0–36.0)
MCV: 89.7 fL (ref 78.0–100.0)
Platelets: 334 10*3/uL (ref 150–400)
RBC: 4.76 MIL/uL (ref 3.87–5.11)
RDW: 12 % (ref 11.5–15.5)
WBC: 11.7 10*3/uL — AB (ref 4.0–10.5)

## 2015-12-19 LAB — SURGICAL PCR SCREEN
MRSA, PCR: NEGATIVE
STAPHYLOCOCCUS AUREUS: NEGATIVE

## 2015-12-19 NOTE — Progress Notes (Signed)
12/03/15- US Arterial- EPIC - shows borderline PAD

## 2015-12-25 ENCOUNTER — Encounter (HOSPITAL_COMMUNITY): Payer: Self-pay | Admitting: Anesthesiology

## 2015-12-25 NOTE — Anesthesia Preprocedure Evaluation (Signed)
Anesthesia Evaluation  Patient identified by MRN, date of birth, ID band Patient awake    Reviewed: Allergy & Precautions, NPO status , Patient's Chart, lab work & pertinent test results  Airway Mallampati: II  TM Distance: >3 FB Neck ROM: Full    Dental no notable dental hx.    Pulmonary pneumonia, resolved, Current Smoker,    Pulmonary exam normal breath sounds clear to auscultation       Cardiovascular Exercise Tolerance: Good + Peripheral Vascular Disease  negative cardio ROS Normal cardiovascular exam Rhythm:Regular Rate:Normal     Neuro/Psych negative neurological ROS  negative psych ROS   GI/Hepatic Neg liver ROS, GERD  Medicated,  Endo/Other  negative endocrine ROS  Renal/GU negative Renal ROS  negative genitourinary   Musculoskeletal negative musculoskeletal ROS (+)   Abdominal   Peds negative pediatric ROS (+)  Hematology negative hematology ROS (+)   Anesthesia Other Findings   Reproductive/Obstetrics negative OB ROS                             Anesthesia Physical Anesthesia Plan  ASA: II  Anesthesia Plan: General   Post-op Pain Management:    Induction: Intravenous  Airway Management Planned: Oral ETT  Additional Equipment:   Intra-op Plan:   Post-operative Plan: Extubation in OR  Informed Consent: I have reviewed the patients History and Physical, chart, labs and discussed the procedure including the risks, benefits and alternatives for the proposed anesthesia with the patient or authorized representative who has indicated his/her understanding and acceptance.   Dental advisory given  Plan Discussed with: CRNA  Anesthesia Plan Comments:         Anesthesia Quick Evaluation

## 2015-12-26 ENCOUNTER — Encounter (HOSPITAL_COMMUNITY): Payer: Self-pay | Admitting: *Deleted

## 2015-12-26 ENCOUNTER — Ambulatory Visit (HOSPITAL_COMMUNITY): Payer: PPO

## 2015-12-26 ENCOUNTER — Ambulatory Visit (HOSPITAL_COMMUNITY): Payer: PPO | Admitting: Anesthesiology

## 2015-12-26 ENCOUNTER — Encounter (HOSPITAL_COMMUNITY)
Admission: RE | Disposition: A | Discharge status: Home / Self Care | Payer: Self-pay | Source: Ambulatory Visit | Attending: Specialist

## 2015-12-26 ENCOUNTER — Ambulatory Visit (HOSPITAL_COMMUNITY)
Admission: RE | Admit: 2015-12-26 | Discharge: 2015-12-27 | Disposition: A | Discharge status: Home / Self Care | Payer: PPO | Source: Ambulatory Visit | Attending: Specialist | Admitting: Specialist

## 2015-12-26 DIAGNOSIS — Z9049 Acquired absence of other specified parts of digestive tract: Secondary | ICD-10-CM | POA: Diagnosis not present

## 2015-12-26 DIAGNOSIS — K219 Gastro-esophageal reflux disease without esophagitis: Secondary | ICD-10-CM | POA: Diagnosis not present

## 2015-12-26 DIAGNOSIS — Z79899 Other long term (current) drug therapy: Secondary | ICD-10-CM | POA: Diagnosis not present

## 2015-12-26 DIAGNOSIS — I739 Peripheral vascular disease, unspecified: Secondary | ICD-10-CM | POA: Diagnosis not present

## 2015-12-26 DIAGNOSIS — F17210 Nicotine dependence, cigarettes, uncomplicated: Secondary | ICD-10-CM | POA: Insufficient documentation

## 2015-12-26 DIAGNOSIS — M4806 Spinal stenosis, lumbar region: Secondary | ICD-10-CM | POA: Diagnosis not present

## 2015-12-26 DIAGNOSIS — M5126 Other intervertebral disc displacement, lumbar region: Secondary | ICD-10-CM | POA: Diagnosis not present

## 2015-12-26 DIAGNOSIS — M48061 Spinal stenosis, lumbar region without neurogenic claudication: Secondary | ICD-10-CM | POA: Diagnosis present

## 2015-12-26 DIAGNOSIS — Z90710 Acquired absence of both cervix and uterus: Secondary | ICD-10-CM | POA: Diagnosis not present

## 2015-12-26 DIAGNOSIS — Z419 Encounter for procedure for purposes other than remedying health state, unspecified: Secondary | ICD-10-CM

## 2015-12-26 HISTORY — PX: LUMBAR LAMINECTOMY/DECOMPRESSION MICRODISCECTOMY: SHX5026

## 2015-12-26 SURGERY — LUMBAR LAMINECTOMY/DECOMPRESSION MICRODISCECTOMY 1 LEVEL
Anesthesia: General | Site: Spine Lumbar | Laterality: Left

## 2015-12-26 MED ORDER — DOCUSATE SODIUM 100 MG PO CAPS: 100.0000 mg | ORAL_CAPSULE | Freq: Two times a day (BID) | ORAL | Status: DC | PRN

## 2015-12-26 MED ORDER — FENTANYL CITRATE (PF) 250 MCG/5ML IJ SOLN
INTRAMUSCULAR | Status: AC
  Filled 2015-12-26: qty 5

## 2015-12-26 MED ORDER — BISACODYL 5 MG PO TBEC: 5.0000 mg | DELAYED_RELEASE_TABLET | Freq: Every day | ORAL | Status: DC | PRN

## 2015-12-26 MED ORDER — SENNA 8.6 MG PO TABS: 1.0000 | ORAL_TABLET | Freq: Every day | ORAL | Status: DC | PRN

## 2015-12-26 MED ORDER — CEFAZOLIN SODIUM-DEXTROSE 2-3 GM-% IV SOLR
2.0000 g | INTRAVENOUS | Status: AC
  Administered 2015-12-26: 2 g via INTRAVENOUS

## 2015-12-26 MED ORDER — ATROPINE SULFATE 0.1 MG/ML IJ SOLN
INTRAMUSCULAR | Status: AC
  Filled 2015-12-26: qty 10

## 2015-12-26 MED ORDER — LACTATED RINGERS IV SOLN
INTRAVENOUS | Status: DC
  Administered 2015-12-26: 07:00:00 via INTRAVENOUS

## 2015-12-26 MED ORDER — MONTELUKAST SODIUM 10 MG PO TABS
10.0000 mg | ORAL_TABLET | Freq: Every day | ORAL | Status: DC
  Administered 2015-12-26: 10 mg via ORAL
  Filled 2015-12-26 (×2): qty 1

## 2015-12-26 MED ORDER — TRIAMCINOLONE ACETONIDE 55 MCG/ACT NA AERO
1.0000 | INHALATION_SPRAY | Freq: Every day | NASAL | Status: DC | PRN
  Filled 2015-12-26: qty 10.8

## 2015-12-26 MED ORDER — LIDOCAINE HCL (CARDIAC) 20 MG/ML IV SOLN
INTRAVENOUS | Status: DC | PRN
  Administered 2015-12-26: 50 mg via INTRAVENOUS

## 2015-12-26 MED ORDER — HYDROMORPHONE HCL 1 MG/ML IJ SOLN: 0.5000 mg | INTRAMUSCULAR | Status: DC | PRN

## 2015-12-26 MED ORDER — ALUM & MAG HYDROXIDE-SIMETH 200-200-20 MG/5ML PO SUSP: 30.0000 mL | Freq: Four times a day (QID) | ORAL | Status: DC | PRN

## 2015-12-26 MED ORDER — KCL IN DEXTROSE-NACL 20-5-0.45 MEQ/L-%-% IV SOLN
INTRAVENOUS | Status: AC
  Administered 2015-12-26: 10:00:00 via INTRAVENOUS
  Filled 2015-12-26 (×2): qty 1000

## 2015-12-26 MED ORDER — ONDANSETRON HCL 4 MG/2ML IJ SOLN
INTRAMUSCULAR | Status: AC
  Filled 2015-12-26: qty 2

## 2015-12-26 MED ORDER — SODIUM CHLORIDE 0.9 % IR SOLN
Status: AC
  Filled 2015-12-26: qty 1

## 2015-12-26 MED ORDER — THROMBIN 5000 UNITS EX SOLR
OROMUCOSAL | Status: DC | PRN
  Administered 2015-12-26: 5 mL via TOPICAL

## 2015-12-26 MED ORDER — LEVOCETIRIZINE DIHYDROCHLORIDE 5 MG PO TABS: 5.0000 mg | ORAL_TABLET | Freq: Every day | ORAL | Status: DC

## 2015-12-26 MED ORDER — DOCUSATE SODIUM 100 MG PO CAPS
100.0000 mg | ORAL_CAPSULE | Freq: Two times a day (BID) | ORAL | Status: DC
  Administered 2015-12-26: 100 mg via ORAL

## 2015-12-26 MED ORDER — ONDANSETRON HCL 4 MG/2ML IJ SOLN: 4.0000 mg | INTRAMUSCULAR | Status: DC | PRN

## 2015-12-26 MED ORDER — HYDROCODONE-ACETAMINOPHEN 5-325 MG PO TABS
1.0000 | ORAL_TABLET | ORAL | Status: DC | PRN
  Administered 2015-12-26: 1 via ORAL
  Administered 2015-12-26 – 2015-12-27 (×4): 2 via ORAL
  Filled 2015-12-26 (×4): qty 2
  Filled 2015-12-26: qty 1

## 2015-12-26 MED ORDER — ALBUTEROL SULFATE HFA 108 (90 BASE) MCG/ACT IN AERS
INHALATION_SPRAY | RESPIRATORY_TRACT | Status: AC
  Filled 2015-12-26: qty 6.7

## 2015-12-26 MED ORDER — HYDROMORPHONE HCL 1 MG/ML IJ SOLN: 0.2500 mg | INTRAMUSCULAR | Status: DC | PRN

## 2015-12-26 MED ORDER — PROPOFOL 10 MG/ML IV BOLUS
INTRAVENOUS | Status: DC | PRN
  Administered 2015-12-26: 150 mg via INTRAVENOUS

## 2015-12-26 MED ORDER — DEXAMETHASONE SODIUM PHOSPHATE 10 MG/ML IJ SOLN
INTRAMUSCULAR | Status: AC
  Filled 2015-12-26: qty 1

## 2015-12-26 MED ORDER — BUPIVACAINE-EPINEPHRINE (PF) 0.5% -1:200000 IJ SOLN
INTRAMUSCULAR | Status: AC
  Filled 2015-12-26: qty 30

## 2015-12-26 MED ORDER — PROMETHAZINE HCL 25 MG/ML IJ SOLN: 6.2500 mg | INTRAMUSCULAR | Status: DC | PRN

## 2015-12-26 MED ORDER — MIDAZOLAM HCL 2 MG/2ML IJ SOLN
INTRAMUSCULAR | Status: AC
  Filled 2015-12-26: qty 2

## 2015-12-26 MED ORDER — CEFAZOLIN SODIUM-DEXTROSE 2-3 GM-% IV SOLR
INTRAVENOUS | Status: AC
  Filled 2015-12-26: qty 50

## 2015-12-26 MED ORDER — ACETAMINOPHEN 325 MG PO TABS: 650.0000 mg | ORAL_TABLET | ORAL | Status: DC | PRN

## 2015-12-26 MED ORDER — THROMBIN 5000 UNITS EX SOLR
CUTANEOUS | Status: AC
  Filled 2015-12-26: qty 10000

## 2015-12-26 MED ORDER — SUGAMMADEX SODIUM 200 MG/2ML IV SOLN
INTRAVENOUS | Status: AC
  Filled 2015-12-26: qty 2

## 2015-12-26 MED ORDER — DEXAMETHASONE SODIUM PHOSPHATE 4 MG/ML IJ SOLN
INTRAMUSCULAR | Status: DC | PRN
  Administered 2015-12-26: 10 mg via INTRAVENOUS

## 2015-12-26 MED ORDER — HYDROCODONE-ACETAMINOPHEN 5-325 MG PO TABS: 1.0000 | ORAL_TABLET | ORAL | Status: DC | PRN

## 2015-12-26 MED ORDER — SUGAMMADEX SODIUM 200 MG/2ML IV SOLN
INTRAVENOUS | Status: DC | PRN
  Administered 2015-12-26: 150 mg via INTRAVENOUS

## 2015-12-26 MED ORDER — BUPIVACAINE-EPINEPHRINE 0.5% -1:200000 IJ SOLN
INTRAMUSCULAR | Status: DC | PRN
  Administered 2015-12-26: 10 mL

## 2015-12-26 MED ORDER — FENTANYL CITRATE (PF) 100 MCG/2ML IJ SOLN
INTRAMUSCULAR | Status: DC | PRN
  Administered 2015-12-26: 50 ug via INTRAVENOUS
  Administered 2015-12-26: 100 ug via INTRAVENOUS

## 2015-12-26 MED ORDER — METHOCARBAMOL 1000 MG/10ML IJ SOLN
500.0000 mg | Freq: Four times a day (QID) | INTRAVENOUS | Status: DC | PRN
  Filled 2015-12-26: qty 5

## 2015-12-26 MED ORDER — ALBUTEROL SULFATE HFA 108 (90 BASE) MCG/ACT IN AERS
INHALATION_SPRAY | RESPIRATORY_TRACT | Status: DC | PRN
  Administered 2015-12-26: 5 via RESPIRATORY_TRACT

## 2015-12-26 MED ORDER — OXYCODONE-ACETAMINOPHEN 5-325 MG PO TABS: 1.0000 | ORAL_TABLET | ORAL | Status: DC | PRN

## 2015-12-26 MED ORDER — SENNOSIDES-DOCUSATE SODIUM 8.6-50 MG PO TABS: 1.0000 | ORAL_TABLET | Freq: Every evening | ORAL | Status: DC | PRN

## 2015-12-26 MED ORDER — ACETAMINOPHEN 650 MG RE SUPP: 650.0000 mg | RECTAL | Status: DC | PRN

## 2015-12-26 MED ORDER — ROCURONIUM BROMIDE 100 MG/10ML IV SOLN
INTRAVENOUS | Status: AC
  Filled 2015-12-26: qty 1

## 2015-12-26 MED ORDER — PHENOL 1.4 % MT LIQD: 1.0000 | OROMUCOSAL | Status: DC | PRN

## 2015-12-26 MED ORDER — SUCCINYLCHOLINE CHLORIDE 20 MG/ML IJ SOLN
INTRAMUSCULAR | Status: DC | PRN
  Administered 2015-12-26: 100 mg via INTRAVENOUS

## 2015-12-26 MED ORDER — MENTHOL 3 MG MT LOZG: 1.0000 | LOZENGE | OROMUCOSAL | Status: DC | PRN

## 2015-12-26 MED ORDER — PROPOFOL 10 MG/ML IV BOLUS
INTRAVENOUS | Status: AC
  Filled 2015-12-26: qty 20

## 2015-12-26 MED ORDER — ROCURONIUM BROMIDE 100 MG/10ML IV SOLN
INTRAVENOUS | Status: DC | PRN
  Administered 2015-12-26 (×2): 10 mg via INTRAVENOUS
  Administered 2015-12-26: 30 mg via INTRAVENOUS

## 2015-12-26 MED ORDER — RISAQUAD PO CAPS
1.0000 | ORAL_CAPSULE | Freq: Every day | ORAL | Status: DC
  Administered 2015-12-26 – 2015-12-27 (×2): 1 via ORAL
  Filled 2015-12-26 (×2): qty 1

## 2015-12-26 MED ORDER — MIDAZOLAM HCL 5 MG/5ML IJ SOLN
INTRAMUSCULAR | Status: DC | PRN
  Administered 2015-12-26: 2 mg via INTRAVENOUS

## 2015-12-26 MED ORDER — ONDANSETRON HCL 4 MG/2ML IJ SOLN
INTRAMUSCULAR | Status: DC | PRN
  Administered 2015-12-26: 4 mg via INTRAVENOUS

## 2015-12-26 MED ORDER — METHOCARBAMOL 500 MG PO TABS
500.0000 mg | ORAL_TABLET | Freq: Four times a day (QID) | ORAL | Status: DC | PRN
  Administered 2015-12-26: 500 mg via ORAL
  Filled 2015-12-26: qty 1

## 2015-12-26 MED ORDER — EPHEDRINE SULFATE 50 MG/ML IJ SOLN
INTRAMUSCULAR | Status: DC | PRN
  Administered 2015-12-26 (×5): 5 mg via INTRAVENOUS

## 2015-12-26 MED ORDER — KCL IN DEXTROSE-NACL 20-5-0.45 MEQ/L-%-% IV SOLN
INTRAVENOUS | Status: AC
  Administered 2015-12-26: 11:00:00
  Filled 2015-12-26: qty 1000

## 2015-12-26 MED ORDER — PANTOPRAZOLE SODIUM 40 MG PO TBEC
40.0000 mg | DELAYED_RELEASE_TABLET | Freq: Every day | ORAL | Status: DC
  Administered 2015-12-27: 40 mg via ORAL
  Filled 2015-12-26: qty 1

## 2015-12-26 MED ORDER — POLYMYXIN B SULFATE 500000 UNITS IJ SOLR
INTRAMUSCULAR | Status: DC | PRN
  Administered 2015-12-26: 500 mL

## 2015-12-26 MED ORDER — CEFAZOLIN SODIUM-DEXTROSE 2-3 GM-% IV SOLR
2.0000 g | Freq: Three times a day (TID) | INTRAVENOUS | Status: AC
  Administered 2015-12-26 – 2015-12-27 (×3): 2 g via INTRAVENOUS
  Filled 2015-12-26 (×3): qty 50

## 2015-12-26 MED ORDER — LORATADINE 10 MG PO TABS
10.0000 mg | ORAL_TABLET | Freq: Every day | ORAL | Status: DC
  Administered 2015-12-26: 10 mg via ORAL
  Filled 2015-12-26 (×2): qty 1

## 2015-12-26 MED ORDER — LIDOCAINE HCL (CARDIAC) 20 MG/ML IV SOLN
INTRAVENOUS | Status: AC
  Filled 2015-12-26: qty 5

## 2015-12-26 MED ORDER — PHENYLEPHRINE HCL 10 MG/ML IJ SOLN
INTRAMUSCULAR | Status: DC | PRN
  Administered 2015-12-26: 80 ug via INTRAVENOUS
  Administered 2015-12-26: 40 ug via INTRAVENOUS
  Administered 2015-12-26 (×3): 80 ug via INTRAVENOUS

## 2015-12-26 SURGICAL SUPPLY — 47 items
BAG ZIPLOCK 12X15 (MISCELLANEOUS) IMPLANT
CLEANER TIP ELECTROSURG 2X2 (MISCELLANEOUS) ×3 IMPLANT
CLOSURE WOUND 1/2 X4 (GAUZE/BANDAGES/DRESSINGS) ×1
CLOTH 2% CHLOROHEXIDINE 3PK (PERSONAL CARE ITEMS) ×3 IMPLANT
DRAPE MICROSCOPE LEICA (MISCELLANEOUS) ×3 IMPLANT
DRAPE POUCH INSTRU U-SHP 10X18 (DRAPES) ×3 IMPLANT
DRAPE SHEET LG 3/4 BI-LAMINATE (DRAPES) ×3 IMPLANT
DRAPE SURG 17X11 SM STRL (DRAPES) ×3 IMPLANT
DRAPE UTILITY XL STRL (DRAPES) ×3 IMPLANT
DRSG AQUACEL AG ADV 3.5X 4 (GAUZE/BANDAGES/DRESSINGS) ×3 IMPLANT
DRSG AQUACEL AG ADV 3.5X 6 (GAUZE/BANDAGES/DRESSINGS) IMPLANT
DURAPREP 26ML APPLICATOR (WOUND CARE) ×3 IMPLANT
DURASEAL SPINE SEALANT 3ML (MISCELLANEOUS) IMPLANT
ELECT BLADE TIP CTD 4 INCH (ELECTRODE) IMPLANT
ELECT REM PT RETURN 9FT ADLT (ELECTROSURGICAL) ×3
ELECTRODE REM PT RTRN 9FT ADLT (ELECTROSURGICAL) ×1 IMPLANT
GLOVE BIOGEL PI IND STRL 7.0 (GLOVE) ×1 IMPLANT
GLOVE BIOGEL PI INDICATOR 7.0 (GLOVE) ×2
GLOVE SURG SS PI 7.0 STRL IVOR (GLOVE) ×3 IMPLANT
GLOVE SURG SS PI 7.5 STRL IVOR (GLOVE) ×3 IMPLANT
GLOVE SURG SS PI 8.0 STRL IVOR (GLOVE) ×6 IMPLANT
GOWN STRL REUS W/TWL XL LVL3 (GOWN DISPOSABLE) ×6 IMPLANT
IV CATH 14GX2 1/4 (CATHETERS) ×3 IMPLANT
KIT BASIN OR (CUSTOM PROCEDURE TRAY) ×3 IMPLANT
KIT POSITIONING SURG ANDREWS (MISCELLANEOUS) ×3 IMPLANT
MANIFOLD NEPTUNE II (INSTRUMENTS) ×3 IMPLANT
NEEDLE SPNL 18GX3.5 QUINCKE PK (NEEDLE) ×6 IMPLANT
PACK LAMINECTOMY ORTHO (CUSTOM PROCEDURE TRAY) ×3 IMPLANT
PATTIES SURGICAL .5 X.5 (GAUZE/BANDAGES/DRESSINGS) IMPLANT
PATTIES SURGICAL .75X.75 (GAUZE/BANDAGES/DRESSINGS) IMPLANT
PATTIES SURGICAL 1X1 (DISPOSABLE) IMPLANT
RUBBERBAND STERILE (MISCELLANEOUS) ×6 IMPLANT
SPONGE SURGIFOAM ABS GEL 100 (HEMOSTASIS) ×3 IMPLANT
STAPLER VISISTAT (STAPLE) IMPLANT
STRIP CLOSURE SKIN 1/2X4 (GAUZE/BANDAGES/DRESSINGS) ×2 IMPLANT
SUT NURALON 4 0 TR CR/8 (SUTURE) IMPLANT
SUT PROLENE 3 0 PS 2 (SUTURE) ×3 IMPLANT
SUT VIC AB 1 CT1 27 (SUTURE)
SUT VIC AB 1 CT1 27XBRD ANTBC (SUTURE) IMPLANT
SUT VIC AB 1-0 CT2 27 (SUTURE) ×3 IMPLANT
SUT VIC AB 2-0 CT1 27 (SUTURE)
SUT VIC AB 2-0 CT1 TAPERPNT 27 (SUTURE) IMPLANT
SUT VIC AB 2-0 CT2 27 (SUTURE) ×3 IMPLANT
SYR 3ML LL SCALE MARK (SYRINGE) IMPLANT
TOWEL OR 17X26 10 PK STRL BLUE (TOWEL DISPOSABLE) ×3 IMPLANT
TOWEL OR NON WOVEN STRL DISP B (DISPOSABLE) IMPLANT
YANKAUER SUCT BULB TIP NO VENT (SUCTIONS) IMPLANT

## 2015-12-26 NOTE — Brief Op Note (Signed)
12/26/2015  9:04 AM  PATIENT:  [name]  64 y.o. female  PRE-OPERATIVE DIAGNOSIS:  STENOSIS HERNIATED NUCLEUS PULPOSA L4-5  POST-OPERATIVE DIAGNOSIS:  STENOSIS HERNIATED NUCLEUS PULPOSA L4-5  PROCEDURE:  Procedure(s): LUMBAR LAMINECTOMY/DECOMPRESSION MICRODISCECTOMY 1 LEVEL L4-5 ON LEFT  (Left)  SURGEON:  Surgeon(s) and Role:    * Jene EveryJeffrey Raseel Jans, MD - Primary  PHYSICIAN ASSISTANT:   ASSISTANTS: Bissell   ANESTHESIA:   general  EBL:     BLOOD ADMINISTERED:none  DRAINS: none   LOCAL MEDICATIONS USED:  MARCAINE     SPECIMEN:  Source of Specimen:  L45  DISPOSITION OF SPECIMEN:  PATHOLOGY  COUNTS:  YES  TOURNIQUET:  * No tourniquets in log *  DICTATION: .Other Dictation: Dictation Number [id number]...  PLAN OF CARE: Admit for overnight observation  PATIENT DISPOSITION:  PACU - hemodynamically stable.   Delay start of Pharmacological VTE agent (>24hrs) due to surgical blood loss or risk of bleeding: yes

## 2015-12-26 NOTE — Transfer of Care (Signed)
Immediate Anesthesia Transfer of Care Note  Patient: [name]  Procedure(s) Performed: Procedure(s): LUMBAR LAMINECTOMY/DECOMPRESSION MICRODISCECTOMY 1 LEVEL L4-5 ON LEFT  (Left)  Patient Location: PACU  Anesthesia Type:General  Level of Consciousness: Patient easily awoken, sedated, comfortable, cooperative, following commands, responds to stimulation.   Airway & Oxygen Therapy: Patient spontaneously breathing, ventilating well, oxygen via simple oxygen mask.  Post-op Assessment: Report given to PACU RN, vital signs reviewed and stable, moving all extremities.   Post vital signs: Reviewed and stable.  Complications: No apparent anesthesia complications

## 2015-12-26 NOTE — Anesthesia Procedure Notes (Signed)
Procedure Name: Intubation Date/Time: 12/26/2015 7:43 AM Performed by: Ludwig LeanJONES, Kaelei Wheeler C Pre-anesthesia Checklist: Patient identified, Emergency Drugs available, Suction available and Patient being monitored Patient Re-evaluated:Patient Re-evaluated prior to inductionOxygen Delivery Method: Circle system utilized Preoxygenation: Pre-oxygenation with 100% oxygen Intubation Type: IV induction Ventilation: Mask ventilation without difficulty Laryngoscope Size: Miller and 2 Grade View: Grade I Tube type: Oral Tube size: 7.0 mm Number of attempts: 1 Airway Equipment and Method: Stylet Placement Confirmation: ETT inserted through vocal cords under direct vision,  positive ETCO2 and breath sounds checked- equal and bilateral Secured at: 22 cm Tube secured with: Tape Dental Injury: Teeth and Oropharynx as per pre-operative assessment

## 2015-12-26 NOTE — Discharge Instructions (Signed)
Walk As Tolerated utilizing back precautions.  No bending, twisting, or lifting.  No driving for 2 weeks.   °Aquacel dressing may remain in place until follow up. May shower with aquacel dressing in place. If the dressing peels off or becomes saturated, you may remove aquacel dressing and place gauze and tape dressing which should be kept clean and dry and changed daily. Do not remove steri-strips if they are present. °See Dr. Kevaughn Ewing in office in 10 to 14 days. Begin taking aspirin 81mg per day starting 4 days after your surgery if not allergic to aspirin or on another blood thinner. °Walk daily even outside. Use a cane or walker only if necessary. °Avoid sitting on soft sofas. °Walk As Tolerated utilizing back precautions.  No bending, twisting, or lifting.  No driving for 2 weeks.   °Aquacel dressing may remain in place until follow up. May shower with aquacel dressing in place. If the dressing peels off or becomes saturated, you may remove aquacel dressing and place gauze and tape dressing which should be kept clean and dry and changed daily. Do not remove steri-strips if they are present. °See Dr. Jaria Conway in office in 10 to 14 days. Begin taking aspirin 81mg per day starting 4 days after your surgery if not allergic to aspirin or on another blood thinner. °Walk daily even outside. Use a cane or walker only if necessary. °Avoid sitting on soft sofas. ° °

## 2015-12-26 NOTE — Anesthesia Postprocedure Evaluation (Signed)
Anesthesia Post Note  Patient: [name]  Procedure(s) Performed: Procedure(s) (LRB): LUMBAR LAMINECTOMY/DECOMPRESSION MICRODISCECTOMY 1 LEVEL L4-5 ON LEFT  (Left)  Patient location during evaluation: PACU Anesthesia Type: General Level of consciousness: awake and alert Pain management: pain level controlled Vital Signs Assessment: post-procedure vital signs reviewed and stable Respiratory status: spontaneous breathing, nonlabored ventilation, respiratory function stable and patient connected to nasal cannula oxygen Cardiovascular status: blood pressure returned to baseline and stable Postop Assessment: no signs of nausea or vomiting Anesthetic complications: no    Last Vitals:  Filed Vitals:   12/26/15 1235 12/26/15 1347  BP: 130/72 120/72  Pulse: 91 83  Temp: 36.7 C 36.9 C  Resp: 19 18    Last Pain:  Filed Vitals:   12/26/15 1348  PainSc: 4                  Martrell Eguia J

## 2015-12-26 NOTE — H&P (Addendum)
[name] is an 64 y.o. female.   Chief Complaint: Left leg pain  HPI: Stenosis HNP L45  Past Medical History  Diagnosis Date  . Pneumonia     hx of 2016   . GERD (gastroesophageal reflux disease)   . Peripheral vascular disease (HCC)     see 12/03/15- US arterial- shows borderline PAD     Past Surgical History  Procedure Laterality Date  . Cholecystectomy    . Abdominal hysterectomy    . Right ovary removal       History reviewed. No pertinent family history. Social History:  reports that she has been smoking Cigarettes.  She has a 45 pack-year smoking history. She has never used smokeless tobacco. She reports that she does not drink alcohol or use illicit drugs.  Allergies:  Allergies  Allergen Reactions  . Clarithromycin Other (See Comments)    Makes her nervous  . Levofloxacin Other (See Comments)    nervousness  . Red Dye Hives and Itching    Medications Prior to Admission  Medication Sig Dispense Refill  . HYDROcodone-acetaminophen (NORCO) 10-325 MG tablet Take 1 tablet by mouth every 8 (eight) hours as needed for moderate pain.    Marland Kitchen. levocetirizine (XYZAL) 5 MG tablet Take 5 mg by mouth daily.    . montelukast (SINGULAIR) 10 MG tablet Take 10 mg by mouth at bedtime.    . pantoprazole (PROTONIX) 40 MG tablet Take 40 mg by mouth daily.    Marland Kitchen. triamcinolone (NASACORT) 55 MCG/ACT AERO nasal inhaler Place 1 spray into the nose daily as needed (allergies).      No results found for this or any previous visit (from the past 48 hour(s)). No results found.  Review of Systems  Neurological: Positive for tingling, sensory change and focal weakness.  All other systems reviewed and are negative.   Blood pressure 112/73, pulse 83, temperature 97.6 F (36.4 Patel), temperature source Oral, resp. rate 16, height 5\' 4"  (1.626 m), weight 69.854 kg (154 lb), SpO2 97 %. Physical Exam  Constitutional: She is oriented to person, place, and time. She appears well-developed.  HENT:   Head: Normocephalic.  Eyes: Pupils are equal, round, and reactive to light.  Neck: Normal range of motion.  Cardiovascular: Normal rate.   Respiratory: Effort normal.  GI: Soft.  Musculoskeletal: Normal range of motion.  Neurological: She is alert and oriented to person, place, and time.  Skin: Skin is warm and dry.  Psychiatric: She has a normal mood and affect.   + SLR left. EHL 4/5 left. Trace DP PT. No DVT  Assessment/Plan HNP stenosis L45 left. Plan Decompression L45 left possible L5S1. Hx of PVD. Left ABI .63 Risks and benefits discussed.  [name] 12/26/2015, 7:30 AM

## 2015-12-26 NOTE — Op Note (Signed)
[name], [name] NO.:  [id number]  MEDICAL RECORD NO.:  [id number]  LOCATION:  [facility]                         FACILITY:  [facility]  PHYSICIAN:  Jene Every, M.D.    DATE OF BIRTH:  1951-09-28  DATE OF PROCEDURE:  12/26/2015 DATE OF DISCHARGE:                              OPERATIVE REPORT   PREOPERATIVE DIAGNOSIS:  Spinal stenosis, herniated nucleus pulposus at L4-5, left.  POSTOPERATIVE DIAGNOSIS:  Spinal stenosis, herniated nucleus pulposus at L4-5, left.  PROCEDURE PERFORMED: 1. Microlumbar decompression L4-5, left. 2. Foraminotomies L4-L5, left. 3. Microdiskectomy 4-5, left. 4. Lysis of epidural venous plexus, 4-5 left.  ANESTHESIA:  General.  ASSISTANT:  Lanna Poche, PA  HISTORY:  89, severe left lower extremity radicular pain, EHL weakness, neural tension signs, had been refractory to conservative treatment. MRI indicating disk herniation, lateral into the foramen.  She also had history of peripheral vascular disease with ABIs of 0.63.  __________ predominantly from her disk herniation, we therefore initially agreed to proceed with lumbar decompression.  Risk and benefits were discussed including bleeding, infection, damage to neurovascular structures, DVT, PE, anesthetic complications, no change in symptoms, worsening symptoms, etc.  TECHNIQUE:  With the patient in supine position, after induction of adequate general anesthesia, 2 g Kefzol, placed prone on the [place] frame.  All bony prominences were well padded.  Lumbar region was prepped and draped in usual sterile fashion.  Two 18-gauge spinal needle was utilized to localize 4-5 interspace to confirm with x-ray.  Incision was made from the spinous process 4-5, subcutaneous tissue was dissected.  Electrocautery was utilized to achieve hemostasis. Dorsolumbar fascia identified and divided in line of skin incision. Paraspinous muscle elevated from lamina 4-5 on the left.   McCullough retractor was placed.  Operating microscope was draped, brought on the surgical field.  A 0.25% Marcaine with epinephrine was infiltrated in perimuscular tissue.  Hemilaminotomy of the caudad edge of L4 was performed with a 2 and 3 mm Kerrison detaching ligamentum flavum. Ligamentum flavum detached from the cephalad edge of L5 utilizing straight curette.  Neuro patty was placed beneath the ligamentum flavum. Ligamentum flavum was partially removed from the interspace at 4-5. There was hypertrophic facet noted.  We were able to obtain Penfield 4 and gently mobilized the L5 nerve root medially.  I performed a generous foraminotomy of 5 to identify the 5 root.  This was fairly compressed into the lateral recess secondary to disk herniation, facet hypertrophy. After decompressing the lateral recess to the medial border of the pedicle with 2 mm Kerrison, I performed a partial medial hemi- facetectomy, large HNP was noted, epidural venous plexus which was cauterized with bipolar to further mobilize the nerve root, medially with a focal HNP, I performed annulotomy.  Copious portion of disk material was removed from the disk space __________ micro pituitary further mobilized with an Epstein preserving the endplates.  Irrigated the disk space with antibiotic irrigation, retrieved additional fragments, both medially and laterally.  Foraminotomy of L4 had been performed as well.  This decompressed the lateral recess.  A 1 cm excursion of the 5 root __________ pedicle was noted following the decompression.  It was erythematous and  edematous.  No evidence of CSF leakage or active bleeding.  Thorough diskectomy was performed.  No probe passed freely at the foramen of 4 and 5.  We checked beneath the thecal sac, the axilla, the foramen of 4 and 5 as well as __________ of the root, no residual disk herniation.  There was 1 cm of excursion of the 5 root __________ pedicle without  tension.  Obtained a confirmatory radiograph at the site and the portal x-ray machine did not function.  We therefore proceeded with closure as the pathology matched her clinical symptoms and the MRI and the disk herniation was noted.  Again, no evidence of CSF leakage or active bleeding.  I copiously irrigated with antibiotic irrigation, residual ligamentum flavum and was draped over the 5 root as well as epidural fat, removed the McCullough retractor.  No active bleeding, copiously irrigated the wound, closed the dorsolumbar fascia with 1 Vicryl, subcu with 2-0, and skin with subcuticular Prolene.  Sterile dressing applied, placed supine on the hospital bed, extubated without difficulty, and transported to the recovery room in satisfactory condition.  The patient tolerated procedure well.  No complications.  Assistant, Lanna PocheJacqueline Bissell, [place].  Minimal blood loss.     Jene EveryJeffrey Wentworth Edelen, M.D.     Cordelia PenJB/MEDQ  D:  12/26/2015  T:  12/26/2015  Job:  [id number]

## 2015-12-27 DIAGNOSIS — M5126 Other intervertebral disc displacement, lumbar region: Secondary | ICD-10-CM | POA: Diagnosis not present

## 2015-12-27 LAB — BASIC METABOLIC PANEL
Anion gap: 7 (ref 5–15)
BUN: 8 mg/dL (ref 6–20)
CALCIUM: 8.8 mg/dL — AB (ref 8.9–10.3)
CHLORIDE: 108 mmol/L (ref 101–111)
CO2: 26 mmol/L (ref 22–32)
CREATININE: 0.55 mg/dL (ref 0.44–1.00)
GFR calc Af Amer: >60 mL/min (ref >60)
GFR calc non Af Amer: >60 mL/min (ref >60)
Glucose, Bld: 106 mg/dL — ABNORMAL HIGH (ref 65–99)
Potassium: 4.4 mmol/L (ref 3.5–5.1)
SODIUM: 141 mmol/L (ref 135–145)

## 2015-12-27 NOTE — Progress Notes (Signed)
PT Cancellation Note  Patient Details Name: [name] MRN: [id number] DOB: [birth year]   Cancelled Treatment:    Reason Eval/Treat Not Completed: PT screened, no needs identified, will sign off. Pt denied need for PT evaluation. Pt stated PA told her she did not need PT. Pt was able to verbalize logroll technique for bed mobility and 3/3 back precautions. Issued back precaution handout just as a reminder.    Rebeca AlertJannie Elgar Scoggins, MPT Pager: [phone]

## 2015-12-27 NOTE — Discharge Summary (Signed)
Physician Discharge Summary   Patient ID: [name] MRN: [id number] DOB/AGE: 08/08/1951 64 y.o.  Admit date: 12/26/2015 Discharge date: 12/27/2015  Primary Diagnosis:   STENOSIS HERNIATED NUCLEUS PULPOSA L4-5  Admission Diagnoses:  Past Medical History  Diagnosis Date  . Pneumonia     hx of 2016   . GERD (gastroesophageal reflux disease)   . Peripheral vascular disease ([place])     see 12/03/15- US arterial- shows borderline PAD    Discharge Diagnoses:   Active Problems:   Spinal stenosis of lumbar region  Procedure:  Procedure(s) (LRB): LUMBAR LAMINECTOMY/DECOMPRESSION MICRODISCECTOMY 1 LEVEL L4-5 ON LEFT  (Left)   Consults: None  HPI:  see H&P    Laboratory Data: Hospital Outpatient Visit on 12/19/2015  Component Date Value Ref Range Status  . Sodium 12/19/2015 140  135 - 145 mmol/L Final  . Potassium 12/19/2015 4.6  3.5 - 5.1 mmol/L Final  . Chloride 12/19/2015 105  101 - 111 mmol/L Final  . CO2 12/19/2015 24  22 - 32 mmol/L Final  . Glucose, Bld 12/19/2015 104* 65 - 99 mg/dL Final  . BUN 12/19/2015 12  6 - 20 mg/dL Final  . Creatinine, Ser 12/19/2015 0.60  0.44 - 1.00 mg/dL Final  . Calcium 12/19/2015 9.6  8.9 - 10.3 mg/dL Final  . GFR calc non Af Amer 12/19/2015 >60  >60 mL/min Final  . GFR calc Af Amer 12/19/2015 >60  >60 mL/min Final   Comment: (NOTE) The eGFR has been calculated using the CKD EPI equation. This calculation has not been validated in all clinical situations. eGFR's persistently <60 mL/min signify possible Chronic Kidney Disease.   . Anion gap 12/19/2015 11  5 - 15 Final  . WBC 12/19/2015 11.7* 4.0 - 10.5 K/uL Final  . RBC 12/19/2015 4.76  3.87 - 5.11 MIL/uL Final  . Hemoglobin 12/19/2015 14.0  12.0 - 15.0 g/dL Final  . HCT 12/19/2015 42.7  36.0 - 46.0 % Final  . MCV 12/19/2015 89.7  78.0 - 100.0 fL Final  . MCH 12/19/2015 29.4  26.0 - 34.0 pg Final  . MCHC 12/19/2015 32.8  30.0 - 36.0 g/dL Final  . RDW 12/19/2015 12.0  11.5 - 15.5  % Final  . Platelets 12/19/2015 334  150 - 400 K/uL Final  . MRSA, PCR 12/19/2015 NEGATIVE  NEGATIVE Final  . Staphylococcus aureus 12/19/2015 NEGATIVE  NEGATIVE Final   Comment:        The Xpert SA Assay (FDA approved for NASAL specimens in patients over 69 years of age), is one component of a comprehensive surveillance program.  Test performance has been validated by [facility] for patients greater than or equal to 98 year old. It is not intended to diagnose infection nor to guide or monitor treatment.    No results for input(s): HGB in the last 72 hours. No results for input(s): WBC, RBC, HCT, PLT in the last 72 hours.  Recent Labs  12/27/15 0435  NA 141  K 4.4  CL 108  CO2 26  BUN 8  CREATININE 0.55  GLUCOSE 106*  CALCIUM 8.8*   No results for input(s): LABPT, INR in the last 72 hours.  X-Rays:Dg Lumbar Spine 2-3 Views  12/19/2015  CLINICAL DATA:  Preoperative lumbar laminectomy L4-5 EXAM: LUMBAR SPINE - 2-3 VIEW COMPARISON:  None. FINDINGS: Frontal and lateral views were obtained. There is upper lumbar levoscoliosis. There is no fracture or spondylolisthesis. There is moderate disc space narrowing at T12-L1. There is mild  disc space narrowing at L1-2 and L3-4. Other disc spaces appear unremarkable. There is atherosclerotic calcification in the aorta. IMPRESSION: Disc space narrowing at T12-L1, L1-2, L3-4. Upper lumbar scoliosis. No fracture or spondylolisthesis. Atherosclerotic calcification noted. Electronically Signed   By: Lowella Grip III M.D.   On: 12/19/2015 09:24   US Arterial Seg Single  12/03/2015  CLINICAL DATA:  Preoperative examination (spine surgery). Patient reports bilateral lower extremity resting pain and claudication. Current smoker. Evaluate for PAD. EXAM: NONINVASIVE PHYSIOLOGIC VASCULAR STUDY OF BILATERAL LOWER EXTREMITIES TECHNIQUE: Evaluation of both lower extremities were performed at rest, including calculation of ankle-brachial indices with  single level Doppler, pressure and pulse volume recording. COMPARISON:  CT abdomen pelvis - 11/27/2011 FINDINGS: Right ABI:  0.93 Left ABI:  0.63 ABI reference: >1.4: Non diagnostic secondary to incompressible vessel calcifications (medial arterial sclerosis of [place]) 1.0-1.4: Normal 0.9-0.99: Borderline PAD 0.8-0.89: Mild PAD 0.5-0.79: Moderate PAD < 0.5: Severe PAD Right Lower Extremity: Biphasic waveforms are demonstrated in the right posterior tibial and dorsalis pedis arteries. Left Lower Extremity: Biphasic waveforms are demonstrated within the left posterior tibial artery. Blunted biphasic, nearly monophasic waveforms are demonstrated in the left dorsalis pedis artery. IMPRESSION: Findings compatible with moderate PAD affecting the left lower extremity and borderline PAD affecting the right lower extremity. Further evaluation with CTA run-off could be performed as clinically indicated. Electronically Signed   By: Sandi Mariscal M.D.   On: 12/03/2015 14:31   Dg Spine Portable 1 View  12/26/2015  CLINICAL DATA:  Intraoperative  surgical decompression EXAM: PORTABLE SPINE - 1 VIEW COMPARISON:  Study obtained earlier in the day FINDINGS: Cross-table lateral image is labeled #2. Metallic probe tip is posterior to the superior aspect of the L5 vertebral body. No fracture or spondylolisthesis. There is atherosclerotic calcification in the aorta. No appreciable disc space narrowing. IMPRESSION: Metallic probe tip is posterior to the superior aspect of the L5 vertebral body. No appreciable disc space narrowing. No fracture or spondylolisthesis. Electronically Signed   By: Lowella Grip III M.D.   On: 12/26/2015 08:18   Dg Spine Portable 1 View  12/26/2015  CLINICAL DATA:  Surgical level L4-5 EXAM: PORTABLE SPINE - 1 VIEW COMPARISON:  Lumbar spine radiographs dated 12/19/2015 FINDINGS: Single lateral radiograph with surgical probes posterior to the L3 and L4 posterior spinous processes. IMPRESSION: Lumbar  localization as above. Electronically Signed   By: Julian Hy M.D.   On: 12/26/2015 08:11    EKG: Orders placed or performed during the hospital encounter of 11/26/11  . EKG     Hospital Course: Patient was admitted to [facility] and taken to the OR and underwent the above state procedure without complications.  Patient tolerated the procedure well and was later transferred to the recovery room and then to the orthopaedic floor for postoperative care.  They were given PO and IV analgesics for pain control following their surgery.  They were given 24 hours of postoperative antibiotics.   PT was consulted postop to assist with mobility and transfers.  The patient was allowed to be WBAT with therapy and was taught back precautions. Discharge planning was consulted to help with postop disposition and equipment needs.  Patient had a great night on the evening of surgery and started to get up OOB with therapy on day one. Patient was seen in rounds and was ready to go home on day one.  They were given discharge instructions and dressing directions.  They were instructed on when to  follow up in the office with Dr. Tonita Cong.   Diet: Regular diet Activity:WBAT; Lspine precautions Follow-up:in 10-14 days Disposition - Home Discharged Condition: good   Discharge Instructions    Call MD / Call 911    Complete by:  As directed   If you experience chest pain or shortness of breath, CALL 911 and be transported to the hospital emergency room.  If you develope a fever above 101 F, pus (white drainage) or increased drainage or redness at the wound, or calf pain, call your surgeon's office.     Constipation Prevention    Complete by:  As directed   Drink plenty of fluids.  Prune juice may be helpful.  You may use a stool softener, such as Colace (over the counter) 100 mg twice a day.  Use MiraLax (over the counter) for constipation as needed.     Diet - low sodium heart healthy    Complete by:  As  directed      Increase activity slowly as tolerated    Complete by:  As directed             Medication List    STOP taking these medications        HYDROcodone-acetaminophen 10-325 MG tablet  Commonly known as:  NORCO  Replaced by:  HYDROcodone-acetaminophen 5-325 MG tablet      TAKE these medications        HYDROcodone-acetaminophen 5-325 MG tablet  Commonly known as:  NORCO/VICODIN  Take 1-2 tablets by mouth every 4 (four) hours as needed for moderate pain.     levocetirizine 5 MG tablet  Commonly known as:  XYZAL  Take 5 mg by mouth daily.     montelukast 10 MG tablet  Commonly known as:  SINGULAIR  Take 10 mg by mouth at bedtime.     pantoprazole 40 MG tablet  Commonly known as:  PROTONIX  Take 40 mg by mouth daily.     triamcinolone 55 MCG/ACT Aero nasal inhaler  Commonly known as:  NASACORT  Place 1 spray into the nose daily as needed (allergies).           Follow-up Information    Follow up with BEANE,JEFFREY C, MD In 2 weeks.   Specialty:  Orthopedic Surgery   Contact information:   [address] [place] [address] [phone]       Signed: Lacie Draft, PA-C Orthopaedic Surgery 12/27/2015, 10:40 AM

## 2015-12-27 NOTE — Progress Notes (Signed)
Subjective: 1 Day Post-Op Procedure(s) (LRB): LUMBAR LAMINECTOMY/DECOMPRESSION MICRODISCECTOMY 1 LEVEL L4-5 ON LEFT  (Left) Patient reports pain as mild.  Reports resolution of hip/leg pain. Mild incisional lower back pain, well controlled. Has been OOB and walking without difficulty. Has been shown how to get in and out of bed. Voiding without difficulty. Positive flatus. No BM yet. Feels ready for D/C home today, reports she felt ready last night but needed to wait until last dose of Ancef at midnight.  Objective: Vital signs in last 24 hours: Temp:  [97.4 F (36.3 C)-98.6 F (37 C)] 98.2 F (36.8 C) (12/29 0534) Pulse Rate:  [79-101] 86 (12/29 0534) Resp:  [16-28] 16 (12/29 0534) BP: (110-131)/(57-72) 110/57 mmHg (12/29 0534) SpO2:  [94 %-100 %] 95 % (12/29 0534)  Intake/Output from previous day: 12/28 0701 - 12/29 0700 In: 3222.5 [P.O.:1560; I.V.:1662.5] Out: 3650 [Urine:3650] Intake/Output this shift:    No results for input(s): HGB in the last 72 hours. No results for input(s): WBC, RBC, HCT, PLT in the last 72 hours.  Recent Labs  12/27/15 0435  NA 141  K 4.4  CL 108  CO2 26  BUN 8  CREATININE 0.55  GLUCOSE 106*  CALCIUM 8.8*   No results for input(s): LABPT, INR in the last 72 hours.  Neurologically intact ABD soft Neurovascular intact Sensation intact distally Intact pulses distally Dorsiflexion/Plantar flexion intact Incision: dressing C/D/I and no drainage No cellulitis present Compartment soft no sign of DVT  Assessment/Plan: 1 Day Post-Op Procedure(s) (LRB): LUMBAR LAMINECTOMY/DECOMPRESSION MICRODISCECTOMY 1 LEVEL L4-5 ON LEFT  (Left) Advance diet Up with therapy D/C IV fluids Discussed D/C instructions, Lspine precautions D/C home today Will discuss with Dr. Shelle IronBeane Follow up in office in 10-14 days  Luara Faye, Lockie ParesJACLYN M. 12/27/2015, 7:16 AM

## 2015-12-30 DIAGNOSIS — I739 Peripheral vascular disease, unspecified: Secondary | ICD-10-CM

## 2015-12-30 HISTORY — DX: Peripheral vascular disease, unspecified: I73.9

## 2016-02-12 DIAGNOSIS — Z4789 Encounter for other orthopedic aftercare: Secondary | ICD-10-CM | POA: Diagnosis not present

## 2016-02-12 DIAGNOSIS — M4806 Spinal stenosis, lumbar region: Secondary | ICD-10-CM | POA: Diagnosis not present

## 2016-02-13 DIAGNOSIS — Z23 Encounter for immunization: Secondary | ICD-10-CM | POA: Diagnosis not present

## 2016-02-13 DIAGNOSIS — Z0000 Encounter for general adult medical examination without abnormal findings: Secondary | ICD-10-CM | POA: Diagnosis not present

## 2016-02-13 DIAGNOSIS — Z6828 Body mass index (BMI) 28.0-28.9, adult: Secondary | ICD-10-CM | POA: Diagnosis not present

## 2016-02-14 ENCOUNTER — Other Ambulatory Visit (HOSPITAL_COMMUNITY): Payer: Self-pay | Admitting: Physician Assistant

## 2016-02-18 ENCOUNTER — Other Ambulatory Visit: Payer: Self-pay

## 2016-02-18 DIAGNOSIS — I739 Peripheral vascular disease, unspecified: Secondary | ICD-10-CM

## 2016-02-19 ENCOUNTER — Other Ambulatory Visit (HOSPITAL_COMMUNITY): Payer: Self-pay | Admitting: Physician Assistant

## 2016-02-19 DIAGNOSIS — N951 Menopausal and female climacteric states: Secondary | ICD-10-CM

## 2016-02-22 ENCOUNTER — Other Ambulatory Visit (HOSPITAL_COMMUNITY): Payer: PPO

## 2016-02-25 ENCOUNTER — Ambulatory Visit (HOSPITAL_COMMUNITY)
Admission: RE | Admit: 2016-02-25 | Discharge: 2016-02-25 | Disposition: A | Discharge status: Home / Self Care | Payer: PPO | Source: Ambulatory Visit | Attending: Physician Assistant | Admitting: Physician Assistant

## 2016-02-25 DIAGNOSIS — M85851 Other specified disorders of bone density and structure, right thigh: Secondary | ICD-10-CM | POA: Diagnosis not present

## 2016-02-25 DIAGNOSIS — Z780 Asymptomatic menopausal state: Secondary | ICD-10-CM | POA: Insufficient documentation

## 2016-02-25 DIAGNOSIS — N951 Menopausal and female climacteric states: Secondary | ICD-10-CM

## 2016-03-07 ENCOUNTER — Encounter: Payer: Self-pay | Admitting: Vascular Surgery

## 2016-03-11 ENCOUNTER — Ambulatory Visit (INDEPENDENT_AMBULATORY_CARE_PROVIDER_SITE_OTHER)
Admission: RE | Admit: 2016-03-11 | Discharge: 2016-03-11 | Disposition: A | Discharge status: Home / Self Care | Payer: PPO | Source: Ambulatory Visit | Attending: Vascular Surgery | Admitting: Vascular Surgery

## 2016-03-11 ENCOUNTER — Ambulatory Visit (INDEPENDENT_AMBULATORY_CARE_PROVIDER_SITE_OTHER): Payer: PPO | Admitting: Vascular Surgery

## 2016-03-11 ENCOUNTER — Ambulatory Visit (HOSPITAL_COMMUNITY)
Admission: RE | Admit: 2016-03-11 | Discharge: 2016-03-11 | Disposition: A | Discharge status: Home / Self Care | Payer: PPO | Source: Ambulatory Visit | Attending: Vascular Surgery | Admitting: Vascular Surgery

## 2016-03-11 ENCOUNTER — Encounter: Payer: Self-pay | Admitting: Vascular Surgery

## 2016-03-11 ENCOUNTER — Other Ambulatory Visit: Payer: Self-pay

## 2016-03-11 VITALS — BP 132/83 | HR 78 | Ht 64.0 in | Wt 156.4 lb

## 2016-03-11 DIAGNOSIS — F17200 Nicotine dependence, unspecified, uncomplicated: Secondary | ICD-10-CM | POA: Diagnosis not present

## 2016-03-11 DIAGNOSIS — I739 Peripheral vascular disease, unspecified: Secondary | ICD-10-CM

## 2016-03-11 DIAGNOSIS — Z01810 Encounter for preprocedural cardiovascular examination: Secondary | ICD-10-CM | POA: Diagnosis not present

## 2016-03-11 NOTE — Addendum Note (Signed)
Addended by: Sharee PimpleMCCHESNEY, MARILYN K on: 03/11/2016 01:14 PM   Modules accepted: Orders

## 2016-03-11 NOTE — Progress Notes (Signed)
Subjective:     Patient ID: [name], female   DOB: October 20, 1951, 65 y.o.   MRN: [id number]  HPI this  65 year old female was referred by Dr. Paula LibraJeff Beane  For evaluation left leg claudication. Patient had microdiscectomy of left lumbar spine performed late December 2016. Patient was having aching discomfort in the left hip and pain radiating down the left leg which has improved. She now has calf discomfort after ambulating about 50 yards which requires her to stop and rest for relief. She denies rest pain and nonhealing ulcers infection or gangrene. She has no history of diabetes mellitus. She does have a long history of ongoing tobacco abuse 1-2 packs per day for 40+ years. She denies any coronary artery disease. She states this is limiting her severely from doing her grocery shopping to her normal daily activities. She has no symptoms the contralateral right leg.  Past Medical History  Diagnosis Date  . Pneumonia     hx of 2016   . GERD (gastroesophageal reflux disease)   . Peripheral vascular disease ([facility])     see 12/03/15- US arterial- shows borderline PAD     Social History  Substance Use Topics  . Smoking status: Current Every Day Smoker -- 1.00 packs/day for 45 years    Types: Cigarettes  . Smokeless tobacco: Never Used  . Alcohol Use: No    No family history on file.  Allergies  Allergen Reactions  . Clarithromycin Other (See Comments)    Makes her nervous  . Levofloxacin Other (See Comments)    nervousness  . Red Dye Hives and Itching     Current outpatient prescriptions:  .  aspirin 81 MG tablet, Take 81 mg by mouth daily., Disp: , Rfl:  .  HYDROcodone-acetaminophen (NORCO) 10-325 MG tablet, , Disp: , Rfl:  .  levocetirizine (XYZAL) 5 MG tablet, Take 5 mg by mouth daily., Disp: , Rfl:  .  montelukast (SINGULAIR) 10 MG tablet, Take 10 mg by mouth at bedtime., Disp: , Rfl:  .  pantoprazole (PROTONIX) 40 MG tablet, Take 40 mg by mouth daily., Disp: , Rfl:  .   HYDROcodone-acetaminophen (NORCO/VICODIN) 5-325 MG tablet, Take 1-2 tablets by mouth every 4 (four) hours as needed for moderate pain. (Patient not taking: Reported on 03/11/2016), Disp: 40 tablet, Rfl: 0 .  triamcinolone (NASACORT) 55 MCG/ACT AERO nasal inhaler, Place 1 spray into the nose daily as needed (allergies). Reported on 03/11/2016, Disp: , Rfl:   Filed Vitals:   03/11/16 0933  BP: 132/83  Pulse: 78  Height: 5\' 4"  (1.626 m)  Weight: 156 lb 6.4 oz (70.943 kg)  SpO2: 95%    Body mass index is 26.83 kg/(m^2).           Review of Systems  Last chest pain does have mild dyspnea on exertion. Past history of esophageal reflux. Denies lateralizing weakness, aphasia, procedures, diplopia, blurred vision, syncope. All other systems negative and a complete review of systems     Objective:   Physical Exam BP 132/83 mmHg  Pulse 78  Ht 5\' 4"  (1.626 m)  Wt 156 lb 6.4 oz (70.943 kg)  BMI 26.83 kg/m2  SpO2 95%  Gen.-alert and oriented x3 in no apparent distress HEENT normal for age Lungs no rhonchi or wheezing Cardiovascular regular rhythm no murmurs carotid pulses 3+ palpable no bruits audible Abdomen soft nontender no palpable masses Musculoskeletal free of  major deformities Skin clear -no rashes Neurologic normal Lower extremities 3+ femoral and  2+ popliteal pulse on the right with 2+ posterior tibial pulse palpable Left leg with 3+ femoral pulse palpable but no popliteal or distal pulses palpable. Both feet appear adequately perfused with no evidence of ischemia or gangrene.  Today I ordered a duplex scan of the left leg and ABIs and waveforms of both legs. ABI on the left leg is 0.77 and the PT in the right leg is  1.01 in the PT with triphasic flow.  Duplex scan was ordered which I reviewed and interpreted. There is focal occlusion of the distal popliteal artery into the tibioperoneal trunk with widely patent distal vessels and widely patent superficial femoral  artery.       Assessment:      limiting claudication left leg due to focal occlusion left popliteal artery  Ongoing tobacco abuse 1-2 packs per day for 40+ years Gastroesophageal reflux disease lumbar spine disease status post microdiscectomy December 2016     Plan:      had a long discussion with patient regarding tobacco abuse  And stressed the importance of complete tobacco cessation not only with her but also with her husband who also smokes heavily and has COPD   Discussed exercise program versus further evaluation and she states she cannot tolerate her symptoms Y they currently are limiting her Have scheduled bilateral lower extremity angiography via right common femoral approach with possible PTA or stenting left popliteal artery if feasible by Dr. Myra Gianotti on arch 28.  Patient return to see me in 4 weeks with saphenous vein mapping to see if she is reasonable candidate for bypass surgery if intervention is not feasible revisable 's   patient has promised that she will quit smoking completely

## 2016-03-17 ENCOUNTER — Encounter (HOSPITAL_COMMUNITY): Payer: PPO

## 2016-03-17 ENCOUNTER — Encounter: Payer: PPO | Admitting: Vascular Surgery

## 2016-03-25 ENCOUNTER — Encounter (HOSPITAL_COMMUNITY)
Admission: RE | Disposition: A | Discharge status: Home / Self Care | Payer: Self-pay | Source: Ambulatory Visit | Attending: Surgery

## 2016-03-25 ENCOUNTER — Ambulatory Visit (HOSPITAL_COMMUNITY)
Admission: RE | Admit: 2016-03-25 | Discharge: 2016-03-25 | Disposition: A | Discharge status: Home / Self Care | Payer: PPO | Source: Ambulatory Visit | Attending: Surgery | Admitting: Surgery

## 2016-03-25 ENCOUNTER — Encounter (HOSPITAL_COMMUNITY): Payer: Self-pay | Admitting: Surgery

## 2016-03-25 DIAGNOSIS — I70212 Atherosclerosis of native arteries of extremities with intermittent claudication, left leg: Secondary | ICD-10-CM | POA: Insufficient documentation

## 2016-03-25 DIAGNOSIS — F17210 Nicotine dependence, cigarettes, uncomplicated: Secondary | ICD-10-CM | POA: Diagnosis not present

## 2016-03-25 DIAGNOSIS — K219 Gastro-esophageal reflux disease without esophagitis: Secondary | ICD-10-CM | POA: Insufficient documentation

## 2016-03-25 DIAGNOSIS — Z7982 Long term (current) use of aspirin: Secondary | ICD-10-CM | POA: Diagnosis not present

## 2016-03-25 HISTORY — PX: PERIPHERAL VASCULAR CATHETERIZATION: SHX172C

## 2016-03-25 LAB — POCT I-STAT, CHEM 8
BUN: 11 mg/dL (ref 6–20)
CALCIUM ION: 1.15 mmol/L (ref 1.13–1.30)
CHLORIDE: 106 mmol/L (ref 101–111)
Creatinine, Ser: 0.6 mg/dL (ref 0.44–1.00)
Glucose, Bld: 110 mg/dL — ABNORMAL HIGH (ref 65–99)
HCT: 42 % (ref 36.0–46.0)
Hemoglobin: 14.3 g/dL (ref 12.0–15.0)
POTASSIUM: 4.2 mmol/L (ref 3.5–5.1)
SODIUM: 141 mmol/L (ref 135–145)
TCO2: 23 mmol/L (ref 0–100)

## 2016-03-25 SURGERY — ABDOMINAL AORTOGRAM W/LOWER EXTREMITY

## 2016-03-25 MED ORDER — LABETALOL HCL 5 MG/ML IV SOLN: 10.0000 mg | INTRAVENOUS | Status: DC | PRN

## 2016-03-25 MED ORDER — FENTANYL CITRATE (PF) 100 MCG/2ML IJ SOLN
INTRAMUSCULAR | Status: DC | PRN
  Administered 2016-03-25: 25 ug via INTRAVENOUS

## 2016-03-25 MED ORDER — MIDAZOLAM HCL 2 MG/2ML IJ SOLN
INTRAMUSCULAR | Status: AC
  Filled 2016-03-25: qty 2

## 2016-03-25 MED ORDER — METOPROLOL TARTRATE 1 MG/ML IV SOLN: 2.0000 mg | INTRAVENOUS | Status: DC | PRN

## 2016-03-25 MED ORDER — HYDRALAZINE HCL 20 MG/ML IJ SOLN
5.0000 mg | INTRAMUSCULAR | Status: DC | PRN
Start: 2016-03-25 — End: 2016-03-25

## 2016-03-25 MED ORDER — IODIXANOL 320 MG/ML IV SOLN
INTRAVENOUS | Status: DC | PRN
  Administered 2016-03-25: 138 mL via INTRA_ARTERIAL

## 2016-03-25 MED ORDER — MORPHINE SULFATE (PF) 10 MG/ML IV SOLN: 2.0000 mg | INTRAVENOUS | Status: DC | PRN

## 2016-03-25 MED ORDER — LIDOCAINE HCL (PF) 1 % IJ SOLN
INTRAMUSCULAR | Status: DC | PRN
  Administered 2016-03-25: 15 mL via SUBCUTANEOUS

## 2016-03-25 MED ORDER — HEPARIN (PORCINE) IN NACL 2-0.9 UNIT/ML-% IJ SOLN
INTRAMUSCULAR | Status: DC | PRN
  Administered 2016-03-25: 1000 mL via INTRA_ARTERIAL

## 2016-03-25 MED ORDER — SODIUM CHLORIDE 0.9 % IV SOLN
INTRAVENOUS | Status: DC
  Administered 2016-03-25: 06:00:00 via INTRAVENOUS

## 2016-03-25 MED ORDER — ACETAMINOPHEN 325 MG RE SUPP
325.0000 mg | RECTAL | Status: DC | PRN
  Filled 2016-03-25: qty 2

## 2016-03-25 MED ORDER — OXYCODONE HCL 5 MG PO TABS: 5.0000 mg | ORAL_TABLET | ORAL | Status: DC | PRN

## 2016-03-25 MED ORDER — ONDANSETRON HCL 4 MG/2ML IJ SOLN: 4.0000 mg | Freq: Four times a day (QID) | INTRAMUSCULAR | Status: DC | PRN

## 2016-03-25 MED ORDER — SODIUM CHLORIDE 0.9 % IV SOLN: 1.0000 mL/kg/h | INTRAVENOUS | Status: DC

## 2016-03-25 MED ORDER — HEPARIN (PORCINE) IN NACL 2-0.9 UNIT/ML-% IJ SOLN
INTRAMUSCULAR | Status: AC
Start: 2016-03-25 — End: 2016-03-25
  Filled 2016-03-25: qty 1000

## 2016-03-25 MED ORDER — LIDOCAINE HCL (PF) 1 % IJ SOLN
INTRAMUSCULAR | Status: AC
  Filled 2016-03-25: qty 30

## 2016-03-25 MED ORDER — ACETAMINOPHEN 325 MG PO TABS
325.0000 mg | ORAL_TABLET | ORAL | Status: DC | PRN
  Filled 2016-03-25: qty 2

## 2016-03-25 MED ORDER — MIDAZOLAM HCL 2 MG/2ML IJ SOLN
INTRAMUSCULAR | Status: DC | PRN
  Administered 2016-03-25: 1 mg via INTRAVENOUS

## 2016-03-25 MED ORDER — FENTANYL CITRATE (PF) 100 MCG/2ML IJ SOLN
INTRAMUSCULAR | Status: AC
  Filled 2016-03-25: qty 2

## 2016-03-25 SURGICAL SUPPLY — 11 items
CATH OMNI FLUSH 5F 65CM (CATHETERS) ×3 IMPLANT
COVER PRB 48X5XTLSCP FOLD TPE (BAG) ×1 IMPLANT
COVER PROBE 5X48 (BAG) ×2
DRAPE ZERO GRAVITY STERILE (DRAPES) ×3 IMPLANT
KIT MICROINTRODUCER STIFF 5F (SHEATH) ×3 IMPLANT
KIT PV (KITS) ×3 IMPLANT
SHEATH PINNACLE 5F 10CM (SHEATH) ×3 IMPLANT
SYR MEDRAD MARK V 150ML (SYRINGE) ×3 IMPLANT
TRANSDUCER W/STOPCOCK (MISCELLANEOUS) ×3 IMPLANT
TRAY PV CATH (CUSTOM PROCEDURE TRAY) ×3 IMPLANT
WIRE BENTSON .035X145CM (WIRE) ×3 IMPLANT

## 2016-03-25 NOTE — Discharge Instructions (Signed)
Angiogram, Care After °Refer to this sheet in the next few weeks. These instructions provide you with information about caring for yourself after your procedure. Your health care provider may also give you more specific instructions. Your treatment has been planned according to current medical practices, but problems sometimes occur. Call your health care provider if you have any problems or questions after your procedure. °WHAT TO EXPECT AFTER THE PROCEDURE °After your procedure, it is typical to have the following: °· Bruising at the catheter insertion site that usually fades within 1-2 weeks. °· Blood collecting in the tissue (hematoma) that may be painful to the touch. It should usually decrease in size and tenderness within 1-2 weeks. °HOME CARE INSTRUCTIONS °· Take medicines only as directed by your health care provider. °· You may shower 24-48 hours after the procedure or as directed by your health care provider. Remove the bandage (dressing) and gently wash the site with plain soap and water. Pat the area dry with a clean towel. Do not rub the site, because this may cause bleeding. °· Do not take baths, swim, or use a hot tub until your health care provider approves. °· Check your insertion site every day for redness, swelling, or drainage. °· Do not apply powder or lotion to the site. °· Do not lift over 10 lb (4.5 kg) for 5 days after your procedure or as directed by your health care provider. °· Ask your health care provider when it is okay to: °¨ Return to work or school. °¨ Resume usual physical activities or sports. °¨ Resume sexual activity. °· Do not drive home if you are discharged the same day as the procedure. Have someone else drive you. °· You may drive 24 hours after the procedure unless otherwise instructed by your health care provider. °· Do not operate machinery or power tools for 24 hours after the procedure or as directed by your health care provider. °· If your procedure was done as an  outpatient procedure, which means that you went home the same day as your procedure, a responsible adult should be with you for the first 24 hours after you arrive home. °· Keep all follow-up visits as directed by your health care provider. This is important. °SEEK MEDICAL CARE IF: °· You have a fever. °· You have chills. °· You have increased bleeding from the catheter insertion site. Hold pressure on the site and call 911. °SEEK IMMEDIATE MEDICAL CARE IF: °· You have unusual pain at the catheter insertion site. °· You have redness, warmth, or swelling at the catheter insertion site. °· You have drainage (other than a small amount of blood on the dressing) from the catheter insertion site. °· The catheter insertion site is bleeding, and the bleeding does not stop after 30 minutes of holding steady pressure on the site. °· The area near or just beyond the catheter insertion site becomes pale, cool, tingly, or numb. °  °This information is not intended to replace advice given to you by your health care provider. Make sure you discuss any questions you have with your health care provider. °  °Document Released: 07/03/2005 Document Revised: 01/05/2015 Document Reviewed: 05/18/2013 °Elsevier Interactive Patient Education ©2016 [employer] ° °

## 2016-03-25 NOTE — Interval H&P Note (Signed)
History and Physical Interval Note:  03/25/2016 7:35 AM  [name]  has presented today for surgery, with the diagnosis of pvd with left leg claudication  The various methods of treatment have been discussed with the patient and family. After consideration of risks, benefits and other options for treatment, the patient has consented to  Procedure(s): Abdominal Aortogram w/Lower Extremity (N/A) as a surgical intervention .  The patient's history has been reviewed, patient examined, no change in status, stable for surgery.  I have reviewed the patient's chart and labs.  Questions were answered to the patient's satisfaction.     Durene CalBrabham, [place]

## 2016-03-25 NOTE — Op Note (Signed)
    Patient name: Erin BastClara A Malter MRN: [id number] DOB: [birth year] Sex: female  03/25/2016 Pre-operative Diagnosis: Left leg claudication Post-operative diagnosis:  Same Surgeon:  Durene CalBrabham, [place] Procedure Performed:  1.  Ultrasound-guided access, right femoral artery  2.  Abdominal aortogram  3.  Second order catheterization  4.  Bilateral lower extremity runoff  5.  Conscious sedation [phone]    Indications:  The patient was seen and evaluated by Dr. Hart RochesterLawson.  She had a duplex which shows distal popliteal occlusion.  She is here for further evaluation.  Procedure:  The patient was identified in the holding area and taken to room 8.  The patient was then placed supine on the table and prepped and draped in the usual sterile fashion.  A time out was called.  Conscious sedation was performed with the use of IV fentanyl and Versed under continuous cardiopulmonary monitoring including heart rate blood pressure and oxygen saturations by myself the attending physician and the circulating nurse.  I was present for the entire portion of the sedation.  Ultrasound was used to evaluate the right common femoral artery.  It was patent .  A digital ultrasound image was acquired.  A micropuncture needle was used to access the right common femoral artery under ultrasound guidance.  An 018 wire was advanced without resistance and a micropuncture sheath was placed.  The 018 wire was removed and a benson wire was placed.  The micropuncture sheath was exchanged for a 5 french sheath.  An omniflush catheter was advanced over the wire to the level of L-1.  An abdominal angiogram was obtained.  Next, using the omniflush catheter and a benson wire, the aortic bifurcation was crossed and the catheter was placed into theleft external iliac artery and left runoff was obtained.  right runoff was performed via retrograde sheath injections.  Findings:   Aortogram:  No significant renal artery stenosis.  The infrarenal abdominal  aorta and bilateral common and external iliac arteries are widely patent.  Right Lower Extremity:  The right common femoral and profunda femoral artery are patent throughout the course.  The right superficial femoral and popliteal artery are widely patent with only minimal stenosis.  There is three-vessel runoff, however the posterior tibial comes diminutive down near the ankle  Left Lower Extremity:  The left common femoral profunda femoral artery are widely patent.  The superficial femoral artery is patent throughout its course within the distal superficial femoral/proximal popliteal artery, there is an isolated plaque creating approximately 50% stenosis.  The popliteal artery occludes below the knee for a short segment approximately 2 cm with reconstitution at the bifurcation of the anterior tibial and tibioperoneal trunk.  There is three-vessel runoff to the ankle.  Intervention:  None  Impression:  #1  no significant aortoiliac disease  #2  left superficial femoral/proximal popliteal isolated plaque creating approximately a 50% stenosis  #3  occluded left distal popliteal artery for approximately 2 cm with reconstitution at the bifurcation between the anterior tibial and tibioperoneal trunk     V. Durene CalWells Laiken Nohr, M.D. [facility] [place] Office: [phone] Pager:  [phone]

## 2016-03-25 NOTE — H&P (View-Only) (Signed)
Subjective:     Patient ID: [name], female   DOB: 11/09/1951, 64 y.o.   MRN: [id number]  HPI this  64-year-old female was referred by Dr. Jeff Beane  For evaluation left leg claudication. Patient had microdiscectomy of left lumbar spine performed late December 2016. Patient was having aching discomfort in the left hip and pain radiating down the left leg which has improved. She now has calf discomfort after ambulating about 50 yards which requires her to stop and rest for relief. She denies rest pain and nonhealing ulcers infection or gangrene. She has no history of diabetes mellitus. She does have a long history of ongoing tobacco abuse 1-2 packs per day for 40+ years. She denies any coronary artery disease. She states this is limiting her severely from doing her grocery shopping to her normal daily activities. She has no symptoms the contralateral right leg.  Past Medical History  Diagnosis Date  . Pneumonia     hx of 2016   . GERD (gastroesophageal reflux disease)   . Peripheral vascular disease (HCC)     see 12/03/15- US arterial- shows borderline PAD     Social History  Substance Use Topics  . Smoking status: Current Every Day Smoker -- 1.00 packs/day for 45 years    Types: Cigarettes  . Smokeless tobacco: Never Used  . Alcohol Use: No    No family history on file.  Allergies  Allergen Reactions  . Clarithromycin Other (See Comments)    Makes her nervous  . Levofloxacin Other (See Comments)    nervousness  . Red Dye Hives and Itching     Current outpatient prescriptions:  .  aspirin 81 MG tablet, Take 81 mg by mouth daily., Disp: , Rfl:  .  HYDROcodone-acetaminophen (NORCO) 10-325 MG tablet, , Disp: , Rfl:  .  levocetirizine (XYZAL) 5 MG tablet, Take 5 mg by mouth daily., Disp: , Rfl:  .  montelukast (SINGULAIR) 10 MG tablet, Take 10 mg by mouth at bedtime., Disp: , Rfl:  .  pantoprazole (PROTONIX) 40 MG tablet, Take 40 mg by mouth daily., Disp: , Rfl:  .   HYDROcodone-acetaminophen (NORCO/VICODIN) 5-325 MG tablet, Take 1-2 tablets by mouth every 4 (four) hours as needed for moderate pain. (Patient not taking: Reported on 03/11/2016), Disp: 40 tablet, Rfl: 0 .  triamcinolone (NASACORT) 55 MCG/ACT AERO nasal inhaler, Place 1 spray into the nose daily as needed (allergies). Reported on 03/11/2016, Disp: , Rfl:   Filed Vitals:   03/11/16 0933  BP: 132/83  Pulse: 78  Height: 5' 4" (1.626 m)  Weight: 156 lb 6.4 oz (70.943 kg)  SpO2: 95%    Body mass index is 26.83 kg/(m^2).           Review of Systems  Last chest pain does have mild dyspnea on exertion. Past history of esophageal reflux. Denies lateralizing weakness, aphasia, procedures, diplopia, blurred vision, syncope. All other systems negative and a complete review of systems     Objective:   Physical Exam BP 132/83 mmHg  Pulse 78  Ht 5' 4" (1.626 m)  Wt 156 lb 6.4 oz (70.943 kg)  BMI 26.83 kg/m2  SpO2 95%  Gen.-alert and oriented x3 in no apparent distress HEENT normal for age Lungs no rhonchi or wheezing Cardiovascular regular rhythm no murmurs carotid pulses 3+ palpable no bruits audible Abdomen soft nontender no palpable masses Musculoskeletal free of  major deformities Skin clear -no rashes Neurologic normal Lower extremities 3+ femoral and    2+ popliteal pulse on the right with 2+ posterior tibial pulse palpable Left leg with 3+ femoral pulse palpable but no popliteal or distal pulses palpable. Both feet appear adequately perfused with no evidence of ischemia or gangrene.  Today I ordered a duplex scan of the left leg and ABIs and waveforms of both legs. ABI on the left leg is 0.77 and the PT in the right leg is  1.01 in the PT with triphasic flow.  Duplex scan was ordered which I reviewed and interpreted. There is focal occlusion of the distal popliteal artery into the tibioperoneal trunk with widely patent distal vessels and widely patent superficial femoral  artery.       Assessment:      limiting claudication left leg due to focal occlusion left popliteal artery  Ongoing tobacco abuse 1-2 packs per day for 40+ years Gastroesophageal reflux disease lumbar spine disease status post microdiscectomy December 2016     Plan:      had a long discussion with patient regarding tobacco abuse  And stressed the importance of complete tobacco cessation not only with her but also with her husband who also smokes heavily and has COPD   Discussed exercise program versus further evaluation and she states she cannot tolerate her symptoms Y they currently are limiting her Have scheduled bilateral lower extremity angiography via right common femoral approach with possible PTA or stenting left popliteal artery if feasible by Dr. Brabham on arch 28.  Patient return to see me in 4 weeks with saphenous vein mapping to see if she is reasonable candidate for bypass surgery if intervention is not feasible revisable 's   patient has promised that she will quit smoking completely       

## 2016-03-25 NOTE — Progress Notes (Signed)
Site area: rfa Site Prior to Removal:  Level 0 Pressure Applied For:20 min Manual:   yes Patient Status During Pull:  stable Post Pull Site:  Level 0 Post Pull Instructions Given:  yes Post Pull Pulses Present: palpable Dressing Applied:  tegaderm Bedrest begins @ 0855 till 1255 Comments:

## 2016-03-31 DIAGNOSIS — Z6828 Body mass index (BMI) 28.0-28.9, adult: Secondary | ICD-10-CM | POA: Diagnosis not present

## 2016-03-31 DIAGNOSIS — E782 Mixed hyperlipidemia: Secondary | ICD-10-CM | POA: Diagnosis not present

## 2016-03-31 DIAGNOSIS — E663 Overweight: Secondary | ICD-10-CM | POA: Diagnosis not present

## 2016-03-31 DIAGNOSIS — F17200 Nicotine dependence, unspecified, uncomplicated: Secondary | ICD-10-CM | POA: Diagnosis not present

## 2016-03-31 DIAGNOSIS — I739 Peripheral vascular disease, unspecified: Secondary | ICD-10-CM | POA: Diagnosis not present

## 2016-04-01 ENCOUNTER — Encounter: Payer: Self-pay | Admitting: Vascular Surgery

## 2016-04-02 ENCOUNTER — Encounter (HOSPITAL_COMMUNITY): Payer: Self-pay

## 2016-04-08 ENCOUNTER — Ambulatory Visit (INDEPENDENT_AMBULATORY_CARE_PROVIDER_SITE_OTHER): Payer: PPO | Admitting: Vascular Surgery

## 2016-04-08 ENCOUNTER — Ambulatory Visit (HOSPITAL_COMMUNITY)
Admission: RE | Admit: 2016-04-08 | Discharge: 2016-04-08 | Disposition: A | Discharge status: Home / Self Care | Payer: PPO | Source: Ambulatory Visit | Attending: Vascular Surgery | Admitting: Vascular Surgery

## 2016-04-08 ENCOUNTER — Encounter: Payer: Self-pay | Admitting: Vascular Surgery

## 2016-04-08 VITALS — BP 131/82 | HR 82 | Temp 98.2°F | Resp 16 | Ht 63.0 in | Wt 158.0 lb

## 2016-04-08 DIAGNOSIS — Z01810 Encounter for preprocedural cardiovascular examination: Secondary | ICD-10-CM

## 2016-04-08 DIAGNOSIS — I739 Peripheral vascular disease, unspecified: Secondary | ICD-10-CM

## 2016-04-08 DIAGNOSIS — K219 Gastro-esophageal reflux disease without esophagitis: Secondary | ICD-10-CM | POA: Diagnosis not present

## 2016-04-08 NOTE — Progress Notes (Signed)
Subjective:     Patient ID: [name], female   DOB: Apr 09, 1951, 65 y.o.   MRN: [id number]  HPI this 65 year old female returns to discuss the findings of her angiogram performed by Dr. Myra GianottiBrabham a few weeks ago. She continues to have severe claudication in the left calf which limits her to walking about 40-50 yards which time she must stop and rest for a few minutes before resuming. She has no history of nonhealing ulcers or infection. She does have some rest discomfort at times. She has no symptoms in the contralateral right leg. She has no history of coronary artery disease diabetes or CVA.  Past Medical History  Diagnosis Date  . Pneumonia     hx of 2016   . GERD (gastroesophageal reflux disease)   . Peripheral vascular disease (HCC)     see 12/03/15- US arterial- shows borderline PAD     Social History  Substance Use Topics  . Smoking status: Current Every Day Smoker -- 1.00 packs/day for 45 years    Types: Cigarettes  . Smokeless tobacco: Never Used  . Alcohol Use: No    History reviewed. No pertinent family history.  Allergies  Allergen Reactions  . Clarithromycin Other (See Comments)    Makes her nervous  . Levofloxacin Other (See Comments)    nervousness  . Red Dye Hives and Itching     Current outpatient prescriptions:  .  acetaminophen (TYLENOL) 500 MG tablet, Take 500 mg by mouth 2 (two) times daily., Disp: , Rfl:  .  aspirin EC 81 MG tablet, Take 81 mg by mouth daily., Disp: , Rfl:  .  levocetirizine (XYZAL) 5 MG tablet, Take 5 mg by mouth daily., Disp: , Rfl:  .  montelukast (SINGULAIR) 10 MG tablet, Take 10 mg by mouth at bedtime., Disp: , Rfl:  .  pantoprazole (PROTONIX) 40 MG tablet, Take 40 mg by mouth daily before breakfast. , Disp: , Rfl:  .  triamcinolone (NASACORT) 55 MCG/ACT AERO nasal inhaler, Place 1 spray into both nostrils daily. Reported on 03/11/2016, Disp: , Rfl:  .  HYDROcodone-acetaminophen (NORCO/VICODIN) 5-325 MG tablet, Take 1-2 tablets by  mouth every 4 (four) hours as needed for moderate pain. (Patient not taking: Reported on 03/11/2016), Disp: 40 tablet, Rfl: 0  Filed Vitals:   04/08/16 1307  BP: 131/82  Pulse: 82  Temp: 98.2 F (36.8 C)  Resp: 16  Height: 5\' 3"  (1.6 m)  Weight: 158 lb (71.668 kg)  SpO2: 96%    Body mass index is 28 kg/(m^2).           Review of Systems patient continues to smoke 1 pack of cigarettes per day and has done so for the past 45 years. Denies chest pain, dyspnea on exertion, PND, orthopnea, hemoptysis.     Objective:   Physical Exam BP 131/82 mmHg  Pulse 82  Temp(Src) 98.2 F (36.8 C)  Resp 16  Ht 5\' 3"  (1.6 m)  Wt 158 lb (71.668 kg)  BMI 28.00 kg/m2  SpO2 96%    Gen.-alert and oriented x3 in no apparent distress HEENT normal for age Lungs no rhonchi or wheezing Cardiovascular regular rhythm no murmurs carotid pulses 3+ palpable no bruits audible Abdomen soft nontender no palpable masses Musculoskeletal free of  major deformities Skin clear -no rashes Neurologic normal Lower extremities 3+ femoral and dorsalis pedis pulses palpable bilaterally with no edema on the right 3+ femoral and absent popliteal and distal pulses on the left No evidence  of ischemia or nonhealing ulcers or infection left foot  Today I reviewed the angiogram performed by Dr. Myra GianottiBrabham on 03/25/2016. This reveals total occlusion of the left popliteal artery with reconstitution of the tibioperoneal trunk. All of the vessels are hypoperfused in this area is difficult to tell if they have much in the way of disease. The popliteal artery is occluded below the knee.  Also there was vein mapping performed which reveals the proximal great saphenous veins to be fairly decent in size but become smaller distally and its unclear whether these are suitable for femoral-tibial bypass       Assessment:     Severe claudication secondary left popliteal occlusion which is limiting patient significantly-this  could become limb threatening ischemia quite easily Ongoing tobacco abuse one pack per day for 45 years Discussed the situation with the patient and she would like to proceed with revascularization I did discuss with her the fact that tensile 4 synthetic graft was in there because of the uncertainty of the length of saphenous vein would be able to utilize. Patient could have a bypass from the distal superficial femoral artery to below knee tibioperoneal trunk using shorter's segments of vein and this may be a potential    Plan:     Have scheduled surgery for Wednesday, May 3. She understands the risks and benefits and the fact that bypass may be unsuccessful. She would like to proceed

## 2016-04-09 ENCOUNTER — Other Ambulatory Visit: Payer: Self-pay

## 2016-04-09 NOTE — Progress Notes (Addendum)
Anesthesia Chart Review: Patient is a 65 year old female scheduled for left FPBG versus F-PT bypass graft on 04/30/16. PAT is scheduled for 04/22/16, but Dr. Hart RochesterLawson requested anesthesia review for input regarding if pre-operative clearances were anticipated. Her LLE claudication limits her walking to about 40-50 yards. There is no history of non-healing ulcers or infection.  History includes smoking (~ 1ppd X 45 years), PNA '16, GERD, PVD with LLE claudication, hysterectomy, right oophorectomy, cholecystectomy, left L4-5 decompression microdiscectomy/foraminotomies (Dr. Shelle IronBeane) 12/26/15. Per [facility] notes, there is no history of CAD or CVA. Last BMI documented as 28. PCP is Dr. Elfredia NevinsLawrence Fusco.  Meds currently listed as Tylenol, ASA 81mg , Xyzal, Singulair, Protonix, Nasacort.  03/25/16 EKG: NSR.  03/25/16 Aortogram with BLE run-off: Impression: 1. No significant aortoiliac disease 2. Left superficial femoral/proximal popliteal isolated plaque creating approximately a 50% stenosis 3. Occluded left distal popliteal artery for approximately 2 cm with reconstitution at the bifurcation between the anterior tibial and tibioperoneal trunk  She will need updated labs at PAT. Cr was 0.60, H/H 14.3/4.20, glucose 110 on 03/25/16 (ISTAT8).   I reviewed above with anesthesiologist Dr. Noreene LarssonJoslin. Since patient with known PVD, smoking, and limited activity with need for vascular surgery, he would recommend a pre-operative cardiology evaluation. I will notify [facility] RN Judeth CornfieldStephanie.  Velna Ochsllison Demarqus Jocson, PA-C [facility]/Anesthesiology Phone [phone] 04/09/2016 3:16 PM  Addendum: Patient was seen by cardiologist Dr. Purvis SheffieldKoneswaran on 04/16/16. A preoperative stress test was ordered which was normal.   04/21/16 Nuclear stress test:  There was no ST segment deviation noted during stress.  No T wave inversion was noted during stress.  The study is normal.  This is a low risk study. The left ventricular ejection  fraction is hyperdynamic (>65%).  Preoperative labs noted. Cr 0.69. CBC, PT/PTT, UA WNL. Glucose 110.   If no acute changes then I would anticipate that she could proceed as planned.  Velna Ochsllison Jazmina Muhlenkamp, PA-C [facility]/Anesthesiology Phone [phone] 04/22/2016 9:48 AM

## 2016-04-10 ENCOUNTER — Telehealth: Payer: Self-pay | Admitting: Vascular Surgery

## 2016-04-10 NOTE — Addendum Note (Signed)
Addended by: Dannielle KarvonenMANESS-HARRISON, Carlis Burnsworth C on: 04/10/2016 03:44 PM   Modules accepted: Orders

## 2016-04-10 NOTE — Telephone Encounter (Signed)
[phone] 04/16/16 1:40 pm. Patient verbalized understanding.

## 2016-04-16 ENCOUNTER — Ambulatory Visit (INDEPENDENT_AMBULATORY_CARE_PROVIDER_SITE_OTHER): Payer: PPO | Admitting: Cardiovascular Disease

## 2016-04-16 ENCOUNTER — Encounter: Payer: Self-pay | Admitting: *Deleted

## 2016-04-16 ENCOUNTER — Encounter: Payer: Self-pay | Admitting: Cardiovascular Disease

## 2016-04-16 ENCOUNTER — Telehealth: Payer: Self-pay | Admitting: Cardiovascular Disease

## 2016-04-16 VITALS — BP 110/74 | HR 92 | Ht 63.0 in | Wt 160.0 lb

## 2016-04-16 DIAGNOSIS — Z720 Tobacco use: Secondary | ICD-10-CM

## 2016-04-16 DIAGNOSIS — I739 Peripheral vascular disease, unspecified: Secondary | ICD-10-CM | POA: Diagnosis not present

## 2016-04-16 DIAGNOSIS — Z01818 Encounter for other preprocedural examination: Secondary | ICD-10-CM | POA: Diagnosis not present

## 2016-04-16 DIAGNOSIS — E785 Hyperlipidemia, unspecified: Secondary | ICD-10-CM | POA: Diagnosis not present

## 2016-04-16 NOTE — Telephone Encounter (Signed)
[facility]

## 2016-04-16 NOTE — Progress Notes (Signed)
Patient ID: [name], female   DOB: August 11, 1951, 65 y.o.   MRN: [id number]       CARDIOLOGY CONSULT NOTE  Patient ID: [name] MRN: [id number] DOB/AGE: 1951/08/02 65 y.o.  Admit date: (Not on file) Primary Physician Cassell Smiles., MD  Reason for Consultation: preop clearance  HPI: The patient is a 65 year old woman with COPD and peripheral arterial disease. She has severe left calf claudication. She has no known history of coronary artery disease. She has a long history of tobacco abuse. She presents for preoperative risk stratification prior to undergoing lower extremity revascularization.  ECG on 03/25/16 demonstrated sinus rhythm with no ischemic ST segment or T-wave abnormalities, nor any arrhythmias.  She denies exertional chest pain and shortness of breath. She also denies leg swelling, orthopnea, palpitations, syncope, and paroxysmal nocturnal dyspnea. She has tried statins before but did not tolerate them and her PCP is in the process of trying different ones. She has gastroesophageal reflux disease and takes Protonix.  Allergies  Allergen Reactions  . Clarithromycin Other (See Comments)    Makes her nervous  . Levofloxacin Other (See Comments)    nervousness  . Red Dye Hives and Itching    Current Outpatient Prescriptions  Medication Sig Dispense Refill  . acetaminophen (TYLENOL) 500 MG tablet Take 500 mg by mouth 2 (two) times daily.    Marland Kitchen aspirin EC 81 MG tablet Take 81 mg by mouth daily.    Marland Kitchen levocetirizine (XYZAL) 5 MG tablet Take 5 mg by mouth daily.    . montelukast (SINGULAIR) 10 MG tablet Take 10 mg by mouth at bedtime.    . pantoprazole (PROTONIX) 40 MG tablet Take 40 mg by mouth daily before breakfast.     . triamcinolone (NASACORT) 55 MCG/ACT AERO nasal inhaler Place 1 spray into both nostrils daily. Reported on 03/11/2016     No current facility-administered medications for this visit.    Past Medical History  Diagnosis Date  . Pneumonia       hx of 2016   . GERD (gastroesophageal reflux disease)   . Peripheral vascular disease (HCC)     see 12/03/15- US arterial- shows borderline PAD     Past Surgical History  Procedure Laterality Date  . Cholecystectomy    . Abdominal hysterectomy    . Right ovary removal     . Lumbar laminectomy/decompression microdiscectomy Left 12/26/2015    Procedure: LUMBAR LAMINECTOMY/DECOMPRESSION MICRODISCECTOMY 1 LEVEL L4-5 ON LEFT ;  Surgeon: Jene Every, MD;  Location: [facility];  Service: Orthopedics;  Laterality: Left;  . Peripheral vascular catheterization N/A 03/25/2016    Procedure: Abdominal Aortogram w/Lower Extremity;  Surgeon: Nada Libman, MD;  Location: [facility] INVASIVE [facility];  Service: Cardiovascular;  Laterality: N/A;    Social History   Social History  . Marital Status: Married    Spouse Name: N/A  . Number of Children: N/A  . Years of Education: N/A   Occupational History  . Not on file.   Social History Main Topics  . Smoking status: Current Every Day Smoker -- 1.00 packs/day for 45 years    Types: Cigarettes  . Smokeless tobacco: Never Used     Comment: 3 per day  . Alcohol Use: No  . Drug Use: No  . Sexual Activity:    Partners: Male   Other Topics Concern  . Not on file   Social History Narrative     No family history of premature CAD in 1st degree  relatives.  Prior to Admission medications   Medication Sig Start Date End Date Taking? Authorizing Provider  acetaminophen (TYLENOL) 500 MG tablet Take 500 mg by mouth 2 (two) times daily.   Yes Historical Provider, MD  aspirin EC 81 MG tablet Take 81 mg by mouth daily.   Yes Historical Provider, MD  levocetirizine (XYZAL) 5 MG tablet Take 5 mg by mouth daily.   Yes Historical Provider, MD  montelukast (SINGULAIR) 10 MG tablet Take 10 mg by mouth at bedtime.   Yes Historical Provider, MD  pantoprazole (PROTONIX) 40 MG tablet Take 40 mg by mouth daily before breakfast.    Yes Historical Provider, MD   triamcinolone (NASACORT) 55 MCG/ACT AERO nasal inhaler Place 1 spray into both nostrils daily. Reported on 03/11/2016   Yes Historical Provider, MD     Review of systems complete and found to be negative unless listed above in HPI     Physical exam Blood pressure 110/74, pulse 92, height 5\' 3"  (1.6 m), weight 160 lb (72.576 kg), SpO2 96 %. General: NAD Neck: No JVD, no thyromegaly or thyroid nodule.  Lungs: Clear to auscultation bilaterally with normal respiratory effort. CV: Nondisplaced PMI. Regular rate and rhythm, normal S1/S2, no S3/S4, no murmur.  No peripheral edema.  No carotid bruit.    Abdomen: Soft, nontender, no distention.  Skin: Intact without lesions or rashes.  Neurologic: Alert and oriented x 3.  Psych: Normal affect. Extremities: No clubbing or cyanosis.  HEENT: Normal.   ECG: Most recent ECG reviewed.  Labs:   Lab Results  Component Value Date   WBC 11.7* 12/19/2015   HGB 14.3 03/25/2016   HCT 42.0 03/25/2016   MCV 89.7 12/19/2015   PLT 334 12/19/2015   No results for input(s): NA, K, CL, CO2, BUN, CREATININE, CALCIUM, PROT, BILITOT, ALKPHOS, ALT, AST, GLUCOSE in the last 168 hours.  Invalid input(s): LABALBU No results found for: CKTOTAL, CKMB, CKMBINDEX, TROPONINI No results found for: CHOL No results found for: HDL No results found for: LDLCALC No results found for: TRIG No results found for: CHOLHDL No results found for: LDLDIRECT       Studies: No results found.  ASSESSMENT AND PLAN:  1. Preoperative risk stratification for PVD surgery: this being an intermediate risk surgery, and given her history of tobacco use and peripheral vascular disease, she is at higher risk of having ischemic heart disease. Will obtain a Lexiscan Cardiolite stress test for more accurate risk stratification. Continue aspirin. She is in the process of trying different statins.  Dispo: fu to be determined.   Signed: Prentice DockerSuresh Laquan Ludden, M.D., F.A.C.C.  04/16/2016,  1:59 PM

## 2016-04-16 NOTE — Patient Instructions (Signed)
Your physician recommends that you continue on your current medications as directed. Please refer to the Current Medication list given to you today. Your physician has requested that you have a lexiscan myoview. For further information please visit [email or web address] Please follow instruction sheet, as given. Your physician recommends that you schedule a follow-up appointment in: to be determined after stress test.

## 2016-04-17 NOTE — Telephone Encounter (Signed)
AUTH # [id number] EXP 10/14/16

## 2016-04-21 ENCOUNTER — Encounter (HOSPITAL_COMMUNITY)
Admission: RE | Admit: 2016-04-21 | Discharge: 2016-04-21 | Disposition: A | Discharge status: Home / Self Care | Payer: PPO | Source: Ambulatory Visit | Attending: Cardiovascular Disease | Admitting: Cardiovascular Disease

## 2016-04-21 ENCOUNTER — Inpatient Hospital Stay (HOSPITAL_COMMUNITY): Admission: RE | Admit: 2016-04-21 | Payer: PPO | Source: Ambulatory Visit

## 2016-04-21 ENCOUNTER — Encounter (HOSPITAL_COMMUNITY): Payer: Self-pay

## 2016-04-21 ENCOUNTER — Other Ambulatory Visit (HOSPITAL_COMMUNITY): Payer: Self-pay | Admitting: *Deleted

## 2016-04-21 DIAGNOSIS — I739 Peripheral vascular disease, unspecified: Secondary | ICD-10-CM | POA: Diagnosis not present

## 2016-04-21 DIAGNOSIS — Z01818 Encounter for other preprocedural examination: Secondary | ICD-10-CM | POA: Diagnosis not present

## 2016-04-21 LAB — NM MYOCAR MULTI W/SPECT W/WALL MOTION / EF
CHL CUP NUCLEAR SRS: 6
CHL CUP RESTING HR STRESS: 81 {beats}/min
CSEPPHR: 99 {beats}/min
LV dias vol: 45 mL (ref 46–106)
LVSYSVOL: 14 mL
NUC STRESS TID: 1.18
RATE: 0.08
SDS: 0
SSS: 6

## 2016-04-21 MED ORDER — SODIUM CHLORIDE 0.9% FLUSH
INTRAVENOUS | Status: AC
  Administered 2016-04-21: 10 mL via INTRAVENOUS
  Filled 2016-04-21: qty 10

## 2016-04-21 MED ORDER — TECHNETIUM TC 99M SESTAMIBI GENERIC - CARDIOLITE
30.0000 | Freq: Once | INTRAVENOUS | Status: AC | PRN
  Administered 2016-04-21: 29.9 via INTRAVENOUS

## 2016-04-21 MED ORDER — REGADENOSON 0.4 MG/5ML IV SOLN
INTRAVENOUS | Status: AC
  Administered 2016-04-21: 0.4 mg via INTRAVENOUS
  Filled 2016-04-21: qty 5

## 2016-04-21 MED ORDER — TECHNETIUM TC 99M SESTAMIBI - CARDIOLITE
10.0000 | Freq: Once | INTRAVENOUS | Status: AC | PRN
  Administered 2016-04-21: 11:00:00 9.7 via INTRAVENOUS

## 2016-04-21 NOTE — Pre-Procedure Instructions (Signed)
    Nalda A Mondesir  04/21/2016      [facility] - [place], [place] - [address] [place] [phone] Phone: [phone] Fax: [phone]    Your procedure is scheduled on 04-30-2016   Wednesday   [facility].M[facility]   Call this number if you have problems the morning of surgery:  [phone]   Remember:  Do not eat food or drink liquids after midnight.   Take these medicines the morning of surgery with A SIP OF WATER Tylenol,xyzal,Singulair,Protonix,Nasacort nasal inhaler   Do not wear jewelry, make-up or nail polish.  Do not wear lotions, powders, or perfumes.  You may not wear deodorant.  Do not shave 48 hours prior to surgery.  Men may shave face and neck.   Do not bring valuables to the hospital.  [facility] is not responsible for any belongings or valuables.  Contacts, dentures or bridgework may not be worn into surgery.  Leave your suitcase in the car.  After surgery it may be brought to your room.  For patients admitted to the hospital, discharge time will be determined by your treatment team.  Patients discharged the day of surgery will not be allowed to drive home.    Special instructions:  See attached Sheet for instructions on CHG showers  Please read over the following fact sheets that you were given. Pain Booklet, Blood Transfusion Information and Surgical Site Infection Prevention

## 2016-04-22 ENCOUNTER — Other Ambulatory Visit (HOSPITAL_COMMUNITY): Payer: Self-pay | Admitting: *Deleted

## 2016-04-22 ENCOUNTER — Encounter (HOSPITAL_COMMUNITY)
Admission: RE | Admit: 2016-04-22 | Discharge: 2016-04-22 | Disposition: A | Discharge status: Home / Self Care | Payer: PPO | Source: Ambulatory Visit | Attending: Vascular Surgery | Admitting: Vascular Surgery

## 2016-04-22 ENCOUNTER — Encounter (HOSPITAL_COMMUNITY): Payer: Self-pay

## 2016-04-22 DIAGNOSIS — I739 Peripheral vascular disease, unspecified: Secondary | ICD-10-CM | POA: Diagnosis not present

## 2016-04-22 DIAGNOSIS — Z0183 Encounter for blood typing: Secondary | ICD-10-CM | POA: Insufficient documentation

## 2016-04-22 DIAGNOSIS — Z01812 Encounter for preprocedural laboratory examination: Secondary | ICD-10-CM | POA: Insufficient documentation

## 2016-04-22 HISTORY — DX: Allergy status to unspecified drugs, medicaments and biological substances status: Z88.9

## 2016-04-22 HISTORY — DX: Hyperlipidemia, unspecified: E78.5

## 2016-04-22 LAB — URINALYSIS, ROUTINE W REFLEX MICROSCOPIC
Bilirubin Urine: NEGATIVE
GLUCOSE, UA: NEGATIVE mg/dL
Hgb urine dipstick: NEGATIVE
Ketones, ur: NEGATIVE mg/dL
LEUKOCYTES UA: NEGATIVE
Nitrite: NEGATIVE
PH: 5.5 (ref 5.0–8.0)
Protein, ur: NEGATIVE mg/dL
SPECIFIC GRAVITY, URINE: 1.006 (ref 1.005–1.030)

## 2016-04-22 LAB — ABO/RH: ABO/RH(D): B POS

## 2016-04-22 LAB — COMPREHENSIVE METABOLIC PANEL
ALT: 21 U/L (ref 14–54)
AST: 22 U/L (ref 15–41)
Albumin: 4 g/dL (ref 3.5–5.0)
Alkaline Phosphatase: 68 U/L (ref 38–126)
Anion gap: 10 (ref 5–15)
BUN: 7 mg/dL (ref 6–20)
CHLORIDE: 107 mmol/L (ref 101–111)
CO2: 23 mmol/L (ref 22–32)
CREATININE: 0.69 mg/dL (ref 0.44–1.00)
Calcium: 9.7 mg/dL (ref 8.9–10.3)
Glucose, Bld: 110 mg/dL — ABNORMAL HIGH (ref 65–99)
Potassium: 4.7 mmol/L (ref 3.5–5.1)
Sodium: 140 mmol/L (ref 135–145)
TOTAL PROTEIN: 7.4 g/dL (ref 6.5–8.1)
Total Bilirubin: 0.5 mg/dL (ref 0.3–1.2)

## 2016-04-22 LAB — PROTIME-INR
INR: 1 (ref 0.00–1.49)
PROTHROMBIN TIME: 13.4 s (ref 11.6–15.2)

## 2016-04-22 LAB — TYPE AND SCREEN
ABO/RH(D): B POS
Antibody Screen: NEGATIVE

## 2016-04-22 LAB — CBC
HCT: 42.6 % (ref 36.0–46.0)
Hemoglobin: 13.5 g/dL (ref 12.0–15.0)
MCH: 28.4 pg (ref 26.0–34.0)
MCHC: 31.7 g/dL (ref 30.0–36.0)
MCV: 89.5 fL (ref 78.0–100.0)
PLATELETS: 335 10*3/uL (ref 150–400)
RBC: 4.76 MIL/uL (ref 3.87–5.11)
RDW: 12.6 % (ref 11.5–15.5)
WBC: 10.3 10*3/uL (ref 4.0–10.5)

## 2016-04-22 LAB — SURGICAL PCR SCREEN
MRSA, PCR: NEGATIVE
STAPHYLOCOCCUS AUREUS: NEGATIVE

## 2016-04-22 LAB — APTT: aPTT: 30 seconds (ref 24–37)

## 2016-04-30 ENCOUNTER — Encounter (HOSPITAL_COMMUNITY): Payer: Self-pay | Admitting: Surgery

## 2016-04-30 ENCOUNTER — Inpatient Hospital Stay (HOSPITAL_COMMUNITY)
Admission: RE | Admit: 2016-04-30 | Discharge: 2016-05-01 | DRG: 253 | Disposition: A | Discharge status: Home / Self Care | Payer: PPO | Source: Ambulatory Visit | Attending: Vascular Surgery | Admitting: Vascular Surgery

## 2016-04-30 ENCOUNTER — Encounter (HOSPITAL_COMMUNITY)
Admission: RE | Disposition: A | Discharge status: Home / Self Care | Payer: Self-pay | Source: Ambulatory Visit | Attending: Vascular Surgery

## 2016-04-30 ENCOUNTER — Inpatient Hospital Stay (HOSPITAL_COMMUNITY): Payer: PPO

## 2016-04-30 ENCOUNTER — Inpatient Hospital Stay (HOSPITAL_COMMUNITY): Payer: PPO | Admitting: Vascular Surgery

## 2016-04-30 DIAGNOSIS — I739 Peripheral vascular disease, unspecified: Secondary | ICD-10-CM

## 2016-04-30 DIAGNOSIS — E785 Hyperlipidemia, unspecified: Secondary | ICD-10-CM | POA: Diagnosis not present

## 2016-04-30 DIAGNOSIS — I70212 Atherosclerosis of native arteries of extremities with intermittent claudication, left leg: Principal | ICD-10-CM | POA: Diagnosis present

## 2016-04-30 DIAGNOSIS — K219 Gastro-esophageal reflux disease without esophagitis: Secondary | ICD-10-CM | POA: Diagnosis not present

## 2016-04-30 DIAGNOSIS — Z881 Allergy status to other antibiotic agents status: Secondary | ICD-10-CM | POA: Diagnosis not present

## 2016-04-30 DIAGNOSIS — Z883 Allergy status to other anti-infective agents status: Secondary | ICD-10-CM

## 2016-04-30 DIAGNOSIS — F17210 Nicotine dependence, cigarettes, uncomplicated: Secondary | ICD-10-CM | POA: Diagnosis present

## 2016-04-30 DIAGNOSIS — I7092 Chronic total occlusion of artery of the extremities: Secondary | ICD-10-CM | POA: Diagnosis not present

## 2016-04-30 DIAGNOSIS — Z419 Encounter for procedure for purposes other than remedying health state, unspecified: Secondary | ICD-10-CM

## 2016-04-30 DIAGNOSIS — Z95828 Presence of other vascular implants and grafts: Secondary | ICD-10-CM | POA: Diagnosis not present

## 2016-04-30 DIAGNOSIS — Z9889 Other specified postprocedural states: Secondary | ICD-10-CM

## 2016-04-30 DIAGNOSIS — I743 Embolism and thrombosis of arteries of the lower extremities: Secondary | ICD-10-CM | POA: Diagnosis not present

## 2016-04-30 HISTORY — PX: ENDARTERECTOMY FEMORAL: SHX5804

## 2016-04-30 HISTORY — PX: INTRAOPERATIVE ARTERIOGRAM: SHX5157

## 2016-04-30 HISTORY — PX: FEMORAL-POPLITEAL BYPASS GRAFT: SHX937

## 2016-04-30 SURGERY — BYPASS GRAFT FEMORAL-POPLITEAL ARTERY
Anesthesia: General | Site: Leg Upper | Laterality: Left

## 2016-04-30 MED ORDER — DEXAMETHASONE SODIUM PHOSPHATE 10 MG/ML IJ SOLN
INTRAMUSCULAR | Status: DC | PRN
  Administered 2016-04-30: 10 mg via INTRAVENOUS

## 2016-04-30 MED ORDER — LACTATED RINGERS IV SOLN
INTRAVENOUS | Status: DC | PRN
  Administered 2016-04-30 (×3): via INTRAVENOUS

## 2016-04-30 MED ORDER — LABETALOL HCL 5 MG/ML IV SOLN: 10.0000 mg | INTRAVENOUS | Status: DC | PRN

## 2016-04-30 MED ORDER — PROTAMINE SULFATE 10 MG/ML IV SOLN
INTRAVENOUS | Status: DC | PRN
  Administered 2016-04-30: 10 mg via INTRAVENOUS
  Administered 2016-04-30 (×2): 20 mg via INTRAVENOUS

## 2016-04-30 MED ORDER — PROPOFOL 10 MG/ML IV BOLUS
INTRAVENOUS | Status: AC
  Filled 2016-04-30: qty 40

## 2016-04-30 MED ORDER — 0.9 % SODIUM CHLORIDE (POUR BTL) OPTIME
TOPICAL | Status: DC | PRN
  Administered 2016-04-30: 3000 mL

## 2016-04-30 MED ORDER — ARTIFICIAL TEARS OP OINT
TOPICAL_OINTMENT | OPHTHALMIC | Status: AC
  Filled 2016-04-30: qty 3.5

## 2016-04-30 MED ORDER — SODIUM CHLORIDE 0.9 % IV SOLN: INTRAVENOUS | Status: DC

## 2016-04-30 MED ORDER — PANTOPRAZOLE SODIUM 40 MG PO TBEC
40.0000 mg | DELAYED_RELEASE_TABLET | Freq: Every day | ORAL | Status: DC
  Administered 2016-05-01: 40 mg via ORAL
  Filled 2016-04-30: qty 1

## 2016-04-30 MED ORDER — ONDANSETRON HCL 4 MG/2ML IJ SOLN: 4.0000 mg | Freq: Four times a day (QID) | INTRAMUSCULAR | Status: DC | PRN

## 2016-04-30 MED ORDER — CHLORHEXIDINE GLUCONATE CLOTH 2 % EX PADS: 6.0000 | MEDICATED_PAD | Freq: Once | CUTANEOUS | Status: DC

## 2016-04-30 MED ORDER — IOPAMIDOL (ISOVUE-300) INJECTION 61%
INTRAVENOUS | Status: DC | PRN
  Administered 2016-04-30: 50 mL via INTRA_ARTERIAL

## 2016-04-30 MED ORDER — OXYCODONE-ACETAMINOPHEN 5-325 MG PO TABS
1.0000 | ORAL_TABLET | ORAL | Status: DC | PRN
  Administered 2016-04-30 (×2): 2 via ORAL
  Administered 2016-05-01: 1 via ORAL
  Filled 2016-04-30: qty 1

## 2016-04-30 MED ORDER — LIDOCAINE 2% (20 MG/ML) 5 ML SYRINGE
INTRAMUSCULAR | Status: AC
  Filled 2016-04-30: qty 10

## 2016-04-30 MED ORDER — METOPROLOL TARTRATE 5 MG/5ML IV SOLN: 2.0000 mg | INTRAVENOUS | Status: DC | PRN

## 2016-04-30 MED ORDER — HEPARIN SODIUM (PORCINE) 1000 UNIT/ML IJ SOLN
INTRAMUSCULAR | Status: AC
  Filled 2016-04-30: qty 1

## 2016-04-30 MED ORDER — ASPIRIN EC 81 MG PO TBEC
81.0000 mg | DELAYED_RELEASE_TABLET | Freq: Every day | ORAL | Status: DC
  Administered 2016-05-01: 81 mg via ORAL
  Filled 2016-04-30: qty 1

## 2016-04-30 MED ORDER — DEXTROSE 5 % IV SOLN
10.0000 mg | INTRAVENOUS | Status: DC | PRN
  Administered 2016-04-30: 20 ug/min via INTRAVENOUS

## 2016-04-30 MED ORDER — OXYCODONE-ACETAMINOPHEN 5-325 MG PO TABS
ORAL_TABLET | ORAL | Status: AC
  Filled 2016-04-30: qty 2

## 2016-04-30 MED ORDER — SORBITOL 70 % SOLN: 30.0000 mL | Freq: Every day | Status: DC | PRN

## 2016-04-30 MED ORDER — ACETAMINOPHEN 500 MG PO TABS: 500.0000 mg | ORAL_TABLET | Freq: Two times a day (BID) | ORAL | Status: DC

## 2016-04-30 MED ORDER — HYDRALAZINE HCL 20 MG/ML IJ SOLN: 5.0000 mg | INTRAMUSCULAR | Status: DC | PRN

## 2016-04-30 MED ORDER — OXYCODONE HCL 5 MG/5ML PO SOLN: 5.0000 mg | Freq: Once | ORAL | Status: DC | PRN

## 2016-04-30 MED ORDER — ONDANSETRON HCL 4 MG/2ML IJ SOLN
INTRAMUSCULAR | Status: DC | PRN
  Administered 2016-04-30: 4 mg via INTRAVENOUS

## 2016-04-30 MED ORDER — ACETAMINOPHEN 650 MG RE SUPP: 325.0000 mg | RECTAL | Status: DC | PRN

## 2016-04-30 MED ORDER — PHENYLEPHRINE 40 MCG/ML (10ML) SYRINGE FOR IV PUSH (FOR BLOOD PRESSURE SUPPORT)
PREFILLED_SYRINGE | INTRAVENOUS | Status: AC
  Filled 2016-04-30: qty 20

## 2016-04-30 MED ORDER — ROCURONIUM BROMIDE 50 MG/5ML IV SOLN
INTRAVENOUS | Status: AC
  Filled 2016-04-30: qty 1

## 2016-04-30 MED ORDER — NEOSTIGMINE METHYLSULFATE 10 MG/10ML IV SOLN
INTRAVENOUS | Status: DC | PRN
  Administered 2016-04-30: 3 mg via INTRAVENOUS

## 2016-04-30 MED ORDER — IOPAMIDOL (ISOVUE-300) INJECTION 61%
INTRAVENOUS | Status: AC
  Filled 2016-04-30: qty 50

## 2016-04-30 MED ORDER — PHENYLEPHRINE HCL 10 MG/ML IJ SOLN
INTRAMUSCULAR | Status: DC | PRN
  Administered 2016-04-30: 120 ug via INTRAVENOUS

## 2016-04-30 MED ORDER — POTASSIUM CHLORIDE CRYS ER 20 MEQ PO TBCR: 20.0000 meq | EXTENDED_RELEASE_TABLET | Freq: Every day | ORAL | Status: DC | PRN

## 2016-04-30 MED ORDER — HEPARIN SODIUM (PORCINE) 1000 UNIT/ML IJ SOLN
INTRAMUSCULAR | Status: DC | PRN
  Administered 2016-04-30: 6000 [IU] via INTRAVENOUS

## 2016-04-30 MED ORDER — TRIAMCINOLONE ACETONIDE 55 MCG/ACT NA AERO
1.0000 | INHALATION_SPRAY | Freq: Every day | NASAL | Status: DC
  Filled 2016-04-30: qty 10.8

## 2016-04-30 MED ORDER — DEXAMETHASONE SODIUM PHOSPHATE 10 MG/ML IJ SOLN
INTRAMUSCULAR | Status: AC
  Filled 2016-04-30: qty 1

## 2016-04-30 MED ORDER — FENTANYL CITRATE (PF) 100 MCG/2ML IJ SOLN
25.0000 ug | INTRAMUSCULAR | Status: DC | PRN
  Administered 2016-04-30 (×3): 50 ug via INTRAVENOUS

## 2016-04-30 MED ORDER — OXYCODONE HCL 5 MG PO TABS: 5.0000 mg | ORAL_TABLET | Freq: Once | ORAL | Status: DC | PRN

## 2016-04-30 MED ORDER — CALCIUM CARBONATE ANTACID 500 MG PO CHEW
1.0000 | CHEWABLE_TABLET | Freq: Once | ORAL | Status: AC
  Administered 2016-04-30: 200 mg via ORAL
  Filled 2016-04-30: qty 1

## 2016-04-30 MED ORDER — MIDAZOLAM HCL 5 MG/5ML IJ SOLN
INTRAMUSCULAR | Status: DC | PRN
  Administered 2016-04-30: 2 mg via INTRAVENOUS

## 2016-04-30 MED ORDER — DIPHENHYDRAMINE HCL 50 MG/ML IJ SOLN
INTRAMUSCULAR | Status: DC | PRN
Start: 2016-04-30 — End: 2016-04-30
  Administered 2016-04-30: 25 mg via INTRAVENOUS

## 2016-04-30 MED ORDER — FENTANYL CITRATE (PF) 250 MCG/5ML IJ SOLN
INTRAMUSCULAR | Status: AC
  Filled 2016-04-30: qty 5

## 2016-04-30 MED ORDER — PROPOFOL 10 MG/ML IV BOLUS
INTRAVENOUS | Status: DC | PRN
  Administered 2016-04-30: 150 mg via INTRAVENOUS

## 2016-04-30 MED ORDER — ONDANSETRON HCL 4 MG/2ML IJ SOLN: 4.0000 mg | Freq: Once | INTRAMUSCULAR | Status: DC | PRN

## 2016-04-30 MED ORDER — DEXTROSE 5 % IV SOLN
INTRAVENOUS | Status: AC
  Filled 2016-04-30: qty 1.5

## 2016-04-30 MED ORDER — SODIUM CHLORIDE 0.9 % IV SOLN: 500.0000 mL | Freq: Once | INTRAVENOUS | Status: DC | PRN

## 2016-04-30 MED ORDER — SODIUM CHLORIDE 0.9 % IV SOLN
INTRAVENOUS | Status: DC
  Administered 2016-04-30: 20:00:00 via INTRAVENOUS

## 2016-04-30 MED ORDER — ALUM & MAG HYDROXIDE-SIMETH 200-200-20 MG/5ML PO SUSP
30.0000 mL | Freq: Once | ORAL | Status: AC
  Administered 2016-04-30: 30 mL via ORAL
  Filled 2016-04-30: qty 30

## 2016-04-30 MED ORDER — FENTANYL CITRATE (PF) 100 MCG/2ML IJ SOLN
INTRAMUSCULAR | Status: AC
  Administered 2016-04-30: 50 ug via INTRAVENOUS
  Filled 2016-04-30: qty 2

## 2016-04-30 MED ORDER — ENOXAPARIN SODIUM 40 MG/0.4ML ~~LOC~~ SOLN
40.0000 mg | SUBCUTANEOUS | Status: DC
  Administered 2016-05-01: 40 mg via SUBCUTANEOUS
  Filled 2016-04-30: qty 0.4

## 2016-04-30 MED ORDER — FENTANYL CITRATE (PF) 100 MCG/2ML IJ SOLN
INTRAMUSCULAR | Status: DC | PRN
  Administered 2016-04-30 (×4): 50 ug via INTRAVENOUS
  Administered 2016-04-30: 100 ug via INTRAVENOUS

## 2016-04-30 MED ORDER — MAGNESIUM SULFATE 2 GM/50ML IV SOLN: 2.0000 g | Freq: Every day | INTRAVENOUS | Status: DC | PRN

## 2016-04-30 MED ORDER — FENTANYL CITRATE (PF) 100 MCG/2ML IJ SOLN
INTRAMUSCULAR | Status: AC
  Filled 2016-04-30: qty 2

## 2016-04-30 MED ORDER — LIDOCAINE HCL (CARDIAC) 20 MG/ML IV SOLN
INTRAVENOUS | Status: DC | PRN
  Administered 2016-04-30: 50 mg via INTRAVENOUS

## 2016-04-30 MED ORDER — ONDANSETRON HCL 4 MG/2ML IJ SOLN
INTRAMUSCULAR | Status: AC
  Filled 2016-04-30: qty 2

## 2016-04-30 MED ORDER — SODIUM CHLORIDE 0.9 % IV SOLN
INTRAVENOUS | Status: DC | PRN
  Administered 2016-04-30: 500 mL

## 2016-04-30 MED ORDER — LORATADINE 10 MG PO TABS
10.0000 mg | ORAL_TABLET | Freq: Every day | ORAL | Status: DC
  Administered 2016-05-01: 10 mg via ORAL
  Filled 2016-04-30: qty 1

## 2016-04-30 MED ORDER — ROCURONIUM BROMIDE 100 MG/10ML IV SOLN
INTRAVENOUS | Status: DC | PRN
  Administered 2016-04-30: 50 mg via INTRAVENOUS
  Administered 2016-04-30: 20 mg via INTRAVENOUS

## 2016-04-30 MED ORDER — GLYCOPYRROLATE 0.2 MG/ML IJ SOLN
INTRAMUSCULAR | Status: DC | PRN
  Administered 2016-04-30: .5 mg via INTRAVENOUS

## 2016-04-30 MED ORDER — MONTELUKAST SODIUM 10 MG PO TABS
10.0000 mg | ORAL_TABLET | Freq: Every day | ORAL | Status: DC
  Administered 2016-04-30: 10 mg via ORAL
  Filled 2016-04-30: qty 1

## 2016-04-30 MED ORDER — DEXTROSE 5 % IV SOLN
1.5000 g | INTRAVENOUS | Status: AC
  Administered 2016-04-30: 1.5 g via INTRAVENOUS

## 2016-04-30 MED ORDER — POLYETHYLENE GLYCOL 3350 17 G PO PACK: 17.0000 g | PACK | Freq: Every day | ORAL | Status: DC | PRN

## 2016-04-30 MED ORDER — ALUM & MAG HYDROXIDE-SIMETH 200-200-20 MG/5ML PO SUSP
30.0000 mL | ORAL | Status: DC | PRN
  Administered 2016-05-01: 30 mL via ORAL
  Filled 2016-04-30: qty 30

## 2016-04-30 MED ORDER — OXYCODONE-ACETAMINOPHEN 5-325 MG PO TABS
ORAL_TABLET | ORAL | Status: AC
  Administered 2016-04-30: 2 via ORAL
  Filled 2016-04-30: qty 2

## 2016-04-30 MED ORDER — ACETAMINOPHEN 325 MG PO TABS: 325.0000 mg | ORAL_TABLET | ORAL | Status: DC | PRN

## 2016-04-30 MED ORDER — MIDAZOLAM HCL 2 MG/2ML IJ SOLN
INTRAMUSCULAR | Status: AC
  Filled 2016-04-30: qty 2

## 2016-04-30 MED ORDER — PROTAMINE SULFATE 10 MG/ML IV SOLN
INTRAVENOUS | Status: AC
  Filled 2016-04-30: qty 5

## 2016-04-30 MED ORDER — DIPHENHYDRAMINE HCL 50 MG/ML IJ SOLN
INTRAMUSCULAR | Status: AC
  Filled 2016-04-30: qty 1

## 2016-04-30 MED ORDER — MORPHINE SULFATE (PF) 2 MG/ML IV SOLN: 2.0000 mg | INTRAVENOUS | Status: DC | PRN

## 2016-04-30 SURGICAL SUPPLY — 56 items
BANDAGE ESMARK 6X9 LF (GAUZE/BANDAGES/DRESSINGS) IMPLANT
BNDG ESMARK 6X9 LF (GAUZE/BANDAGES/DRESSINGS)
CANISTER SUCTION 2500CC (MISCELLANEOUS) ×5 IMPLANT
CATH EMB 3FR 80CM (CATHETERS) ×5 IMPLANT
CLIP TI MEDIUM 24 (CLIP) ×5 IMPLANT
CLIP TI WIDE RED SMALL 24 (CLIP) ×10 IMPLANT
DECANTER SPIKE VIAL GLASS SM (MISCELLANEOUS) IMPLANT
DRAIN SNY 10X20 3/4 PERF (WOUND CARE) IMPLANT
DRAPE PROXIMA HALF (DRAPES) IMPLANT
DRAPE X-RAY CASS 24X20 (DRAPES) ×5 IMPLANT
ELECT REM PT RETURN 9FT ADLT (ELECTROSURGICAL) ×5
ELECTRODE REM PT RTRN 9FT ADLT (ELECTROSURGICAL) ×3 IMPLANT
EVACUATOR SILICONE 100CC (DRAIN) IMPLANT
GLOVE BIOGEL PI IND STRL 6.5 (GLOVE) ×3 IMPLANT
GLOVE BIOGEL PI IND STRL 7.0 (GLOVE) ×6 IMPLANT
GLOVE BIOGEL PI IND STRL 7.5 (GLOVE) ×3 IMPLANT
GLOVE BIOGEL PI INDICATOR 6.5 (GLOVE) ×2
GLOVE BIOGEL PI INDICATOR 7.0 (GLOVE) ×4
GLOVE BIOGEL PI INDICATOR 7.5 (GLOVE) ×2
GLOVE ECLIPSE 6.5 STRL STRAW (GLOVE) ×5 IMPLANT
GLOVE ECLIPSE 7.0 STRL STRAW (GLOVE) ×5 IMPLANT
GLOVE SS BIOGEL STRL SZ 7 (GLOVE) ×3 IMPLANT
GLOVE SUPERSENSE BIOGEL SZ 7 (GLOVE) ×2
GOWN STRL REUS W/ TWL LRG LVL3 (GOWN DISPOSABLE) ×9 IMPLANT
GOWN STRL REUS W/TWL LRG LVL3 (GOWN DISPOSABLE) ×6
INSERT FOGARTY SM (MISCELLANEOUS) IMPLANT
KIT BASIN OR (CUSTOM PROCEDURE TRAY) ×5 IMPLANT
KIT ROOM TURNOVER OR (KITS) ×5 IMPLANT
LIQUID BAND (GAUZE/BANDAGES/DRESSINGS) ×10 IMPLANT
NS IRRIG 1000ML POUR BTL (IV SOLUTION) ×15 IMPLANT
PACK PERIPHERAL VASCULAR (CUSTOM PROCEDURE TRAY) ×5 IMPLANT
PAD ARMBOARD 7.5X6 YLW CONV (MISCELLANEOUS) ×10 IMPLANT
PADDING CAST COTTON 6X4 STRL (CAST SUPPLIES) IMPLANT
SET COLLECT BLD 21X3/4 12 (NEEDLE) ×5 IMPLANT
SPONGE LAP 18X18 X RAY DECT (DISPOSABLE) ×5 IMPLANT
STOPCOCK 4 WAY LG BORE MALE ST (IV SETS) ×5 IMPLANT
SUT PROLENE 5 0 C1 (SUTURE) IMPLANT
SUT PROLENE 6 0 BV (SUTURE) IMPLANT
SUT PROLENE 6 0 C 1 24 (SUTURE) ×15 IMPLANT
SUT PROLENE 6 0 CC (SUTURE) ×10 IMPLANT
SUT PROLENE 7 0 BV 1 (SUTURE) ×10 IMPLANT
SUT SILK 2 0 SH (SUTURE) ×5 IMPLANT
SUT SILK 3 0 (SUTURE) ×2
SUT SILK 3-0 18XBRD TIE 12 (SUTURE) ×3 IMPLANT
SUT SILK 4 0 (SUTURE) ×2
SUT SILK 4-0 18XBRD TIE 12 (SUTURE) ×3 IMPLANT
SUT VIC AB 2-0 CTX 27 (SUTURE) ×5 IMPLANT
SUT VIC AB 2-0 CTX 36 (SUTURE) ×10 IMPLANT
SUT VIC AB 3-0 SH 27 (SUTURE) ×8
SUT VIC AB 3-0 SH 27X BRD (SUTURE) ×12 IMPLANT
SUT VICRYL 4-0 PS2 18IN ABS (SUTURE) ×5 IMPLANT
SYR TB 1ML LUER SLIP (SYRINGE) ×5 IMPLANT
TRAY FOLEY W/METER SILVER 16FR (SET/KITS/TRAYS/PACK) ×5 IMPLANT
TUBING EXTENTION W/L.L. (IV SETS) ×5 IMPLANT
UNDERPAD 30X30 INCONTINENT (UNDERPADS AND DIAPERS) ×5 IMPLANT
WATER STERILE IRR 1000ML POUR (IV SOLUTION) ×5 IMPLANT

## 2016-04-30 NOTE — H&P (View-Only) (Signed)
Subjective:     Patient ID: [name], female   DOB: Apr 09, 1951, 65 y.o.   MRN: [id number]  HPI this 65 year old female returns to discuss the findings of her angiogram performed by Dr. Myra GianottiBrabham a few weeks ago. She continues to have severe claudication in the left calf which limits her to walking about 40-50 yards which time she must stop and rest for a few minutes before resuming. She has no history of nonhealing ulcers or infection. She does have some rest discomfort at times. She has no symptoms in the contralateral right leg. She has no history of coronary artery disease diabetes or CVA.  Past Medical History  Diagnosis Date  . Pneumonia     hx of 2016   . GERD (gastroesophageal reflux disease)   . Peripheral vascular disease (HCC)     see 12/03/15- US arterial- shows borderline PAD     Social History  Substance Use Topics  . Smoking status: Current Every Day Smoker -- 1.00 packs/day for 45 years    Types: Cigarettes  . Smokeless tobacco: Never Used  . Alcohol Use: No    History reviewed. No pertinent family history.  Allergies  Allergen Reactions  . Clarithromycin Other (See Comments)    Makes her nervous  . Levofloxacin Other (See Comments)    nervousness  . Red Dye Hives and Itching     Current outpatient prescriptions:  .  acetaminophen (TYLENOL) 500 MG tablet, Take 500 mg by mouth 2 (two) times daily., Disp: , Rfl:  .  aspirin EC 81 MG tablet, Take 81 mg by mouth daily., Disp: , Rfl:  .  levocetirizine (XYZAL) 5 MG tablet, Take 5 mg by mouth daily., Disp: , Rfl:  .  montelukast (SINGULAIR) 10 MG tablet, Take 10 mg by mouth at bedtime., Disp: , Rfl:  .  pantoprazole (PROTONIX) 40 MG tablet, Take 40 mg by mouth daily before breakfast. , Disp: , Rfl:  .  triamcinolone (NASACORT) 55 MCG/ACT AERO nasal inhaler, Place 1 spray into both nostrils daily. Reported on 03/11/2016, Disp: , Rfl:  .  HYDROcodone-acetaminophen (NORCO/VICODIN) 5-325 MG tablet, Take 1-2 tablets by  mouth every 4 (four) hours as needed for moderate pain. (Patient not taking: Reported on 03/11/2016), Disp: 40 tablet, Rfl: 0  Filed Vitals:   04/08/16 1307  BP: 131/82  Pulse: 82  Temp: 98.2 F (36.8 C)  Resp: 16  Height: 5\' 3"  (1.6 m)  Weight: 158 lb (71.668 kg)  SpO2: 96%    Body mass index is 28 kg/(m^2).           Review of Systems patient continues to smoke 1 pack of cigarettes per day and has done so for the past 45 years. Denies chest pain, dyspnea on exertion, PND, orthopnea, hemoptysis.     Objective:   Physical Exam BP 131/82 mmHg  Pulse 82  Temp(Src) 98.2 F (36.8 C)  Resp 16  Ht 5\' 3"  (1.6 m)  Wt 158 lb (71.668 kg)  BMI 28.00 kg/m2  SpO2 96%    Gen.-alert and oriented x3 in no apparent distress HEENT normal for age Lungs no rhonchi or wheezing Cardiovascular regular rhythm no murmurs carotid pulses 3+ palpable no bruits audible Abdomen soft nontender no palpable masses Musculoskeletal free of  major deformities Skin clear -no rashes Neurologic normal Lower extremities 3+ femoral and dorsalis pedis pulses palpable bilaterally with no edema on the right 3+ femoral and absent popliteal and distal pulses on the left No evidence  of ischemia or nonhealing ulcers or infection left foot  Today I reviewed the angiogram performed by Dr. Myra Gianotti on 03/25/2016. This reveals total occlusion of the left popliteal artery with reconstitution of the tibioperoneal trunk. All of the vessels are hypoperfused in this area is difficult to tell if they have much in the way of disease. The popliteal artery is occluded below the knee.  Also there was vein mapping performed which reveals the proximal great saphenous veins to be fairly decent in size but become smaller distally and its unclear whether these are suitable for femoral-tibial bypass       Assessment:     Severe claudication secondary left popliteal occlusion which is limiting patient significantly-this  could become limb threatening ischemia quite easily Ongoing tobacco abuse one pack per day for 45 years Discussed the situation with the patient and she would like to proceed with revascularization I did discuss with her the fact that tensile 4 synthetic graft was in there because of the uncertainty of the length of saphenous vein would be able to utilize. Patient could have a bypass from the distal superficial femoral artery to below knee tibioperoneal trunk using shorter's segments of vein and this may be a potential    Plan:     Have scheduled surgery for Wednesday, May 3. She understands the risks and benefits and the fact that bypass may be unsuccessful. She would like to proceed

## 2016-04-30 NOTE — Anesthesia Postprocedure Evaluation (Signed)
Anesthesia Post Note  Patient: Sruthi A Gaughran  Procedure(s) Performed: Procedure(s) (LRB): Left Distal Superfical Femoral to Tibio-peroneal trunk bypass using non-reversed saphenous vein graph Left Leg (Left) Endarterectomy of Left popliteal, anterior tibial and tibio-peroneal trunk (Left) Left Leg INTRA OPERATIVE ARTERIOGRAM (Left)  Patient location during evaluation: PACU Anesthesia Type: General Level of consciousness: awake, awake and alert and oriented Pain management: pain level controlled Vital Signs Assessment: post-procedure vital signs reviewed and stable Respiratory status: spontaneous breathing, nonlabored ventilation and respiratory function stable Cardiovascular status: blood pressure returned to baseline Anesthetic complications: no    Last Vitals:  Filed Vitals:   04/30/16 1404 04/30/16 1419  BP: 117/83 133/77  Pulse: 98 91  Temp:    Resp: 16 16    Last Pain:  Filed Vitals:   04/30/16 1432  PainSc: 7                  Bensyn Bornemann COKER

## 2016-04-30 NOTE — Progress Notes (Signed)
       Patient alert in PACU  Palpable DP/PT left LE Incisions clean and dry without hematoma  S/P Left Distal Superfical Femoral to Tibio-peroneal trunk bypass using non-reversed saphenous vein graph Left Leg Endarterectomy of Left popliteal, anterior tibial and tibio-peroneal trunk  Haziel Molner MAUREEN PA-C

## 2016-04-30 NOTE — Anesthesia Preprocedure Evaluation (Signed)
Anesthesia Evaluation  Patient identified by MRN, date of birth, ID band Patient awake    Reviewed: Allergy & Precautions, NPO status , Patient's Chart, lab work & pertinent test results  Airway Mallampati: II  TM Distance: >3 FB Neck ROM: Full    Dental  (+) Partial Upper, Dental Advisory Given   Pulmonary Current Smoker,    breath sounds clear to auscultation       Cardiovascular  Rhythm:Regular Rate:Normal     Neuro/Psych    GI/Hepatic   Endo/Other    Renal/GU      Musculoskeletal   Abdominal   Peds  Hematology   Anesthesia Other Findings   Reproductive/Obstetrics                             Anesthesia Physical Anesthesia Plan  ASA: III  Anesthesia Plan: General   Post-op Pain Management:    Induction: Intravenous  Airway Management Planned: Oral ETT  Additional Equipment:   Intra-op Plan:   Post-operative Plan:   Informed Consent: I have reviewed the patients History and Physical, chart, labs and discussed the procedure including the risks, benefits and alternatives for the proposed anesthesia with the patient or authorized representative who has indicated his/her understanding and acceptance.   Dental advisory given  Plan Discussed with: CRNA and Anesthesiologist  Anesthesia Plan Comments:         Anesthesia Quick Evaluation

## 2016-04-30 NOTE — Interval H&P Note (Signed)
History and Physical Interval Note:  04/30/2016 8:02 AM  [name]  has presented today for surgery, with the diagnosis of Peripheral vascular disease with left lower extremity claudication I70.212  The various methods of treatment have been discussed with the patient and family. After consideration of risks, benefits and other options for treatment, the patient has consented to  Procedure(s): BYPASS GRAFT FEMORAL-POPLITEAL ARTERY VERSUS FEMORAL TO POSTERIOR TIBIAL ARTERY (Left) GREATER SAPHENOUS VEIN HARVEST (Left) as a surgical intervention .  The patient's history has been reviewed, patient examined, no change in status, stable for surgery.  I have reviewed the patient's chart and labs.  Questions were answered to the patient's satisfaction.     Josephina GipLawson, Trysta Showman

## 2016-04-30 NOTE — Op Note (Signed)
OPERATIVE REPORT  Date of Surgery: 04/30/2016  Surgeon: Josephina Gip, MD  Assistant: Lianne Cure PA  Pre-op Diagnosis: severe claudication due to superficial popliteal and tibial occlusive disease left leg   Post-op Diagnosis: severe claudication due to superficial popliteal and tibial occlusive disease left leg   Procedure: Procedure(s): Left Distal Superfical Femoral to Tibio-peroneal trunk bypass using non-reversed saphenous vein graph Left Leg Endarterectomy of Left popliteal, anterior tibial and tibio-peroneal trunk Left Leg INTRA OPERATIVE ARTERIOGRAM  Anesthesia: General  EBL: 150 cc  The patient was taken the operating room placed in supine position at which time  Satisfactory general tracheal anesthesia was administered. The left leg was prepped with Betadine scrub and solution draped in routine sterile manner. The saphenous vein which hadn't been known to be marginal in size by preoperative vein mapping was then exposed from the saphenofemoral junction to the proximal calf. It was of reasonable size down to the upper to mid third of the thigh where it became quite small. His branches ligated with 3 and 4-0 silk ties and divided it was removed gently dilated with heparinized saline and marked for orientation purposes. It was not long enough to reach from the common femoral artery to below the knee but since the superficial femoral artery was patent although it did have one significant stenotic area it was decided to bypass from the very distal superficial femoral artery to below the knee. Therefore the distal superficial femoral artery was exposed through a longitudinal incision the distal thigh as it exited the abductor canal. It was diffusely diseased but did have an excellent pulse. Following this the  Popliteal artery was exposed below the knee through the vein harvesting incision. It was diffusely disease vessel and was totally occluded by angiography down to the origin of the  tibioperoneal trunk. The anterior tibial artery tibioperoneal trunk posterior tibial artery and peroneal arteries were all controlled and dissected free there was diffuse plaque throughout the tibioperoneal trunk and into the origin of the anterior tibial artery. Patient was then heparinized. Very distal superficial femoral artery was occluded with a vascular clamp opened longitudinally 15 blade extended with Potts scissors. It was diffusely diseased but had excellent inflow. 4 dilator would easily passed proximally and did encounter the stenotic lesion which was noted on the preoperative angiogram only about 50% in severity and very localized.  There was good inflow. Proximal end of the vein was spatulated and anastomosed in the side using continuous 6-0 Prolene. Clamps then released there was good pulse down to the first set of competent valves. Using retrograde valvulotome the valves were rendered incompetent with resultant excellent flow distally the vein graft. The vein was carefully delivered through the tunnel the popliteal and tibial vessels were all occluded with vessel loops longitudinal opening made in the very distal popliteal artery which was chronically occluded. It was extended down the tibioperoneal trunk hroughout its entire length down to the origin of the posterior tibial and peroneal arteries. Longitudinal arthrotomy was then made and extended with the Potts scissors throughout the tibioperoneal trunk which was severely diseased. The plaque extending the origin of the anterior tibial artery although the posterior tibial artery was small it was free of disease. Endarterectomy of the tibioperoneal trunk was performed the plaque feathered out of the origin of the peroneal artery nicely and also feathered out of the anterior tibial artery about 3 cm into the length of the vessel. 3 Fogarty catheter were then passed down the anterior tibial and peroneal  arteries without difficulty. The endarterectomy  included the distal end of the popliteal artery which had very sluggish inflow was essentially chronically occluded. Following this the vein was measured spatulated over long distances anastomosed inside using continuous 6-0 Prolene. Anastomosis started in the distal 2 cm in the popliteal artery and extended throughout the tibioperoneal trunk. Prior to completion of closure appropriate flushing was performed following completion the vessel loops were released there was good Doppler flow in all vessels. Intraoperative arteriogram revealed a widely patent distal anastomosis with three-vessel runoff. Protamine was then given to reverse the heparin following adequate hemostasis wounds were all closed in layers with Vicryl subject her fashion with Dermabond patient taken to recovery in satisfactory condition  Complications: None  Procedure Details:   Josephina GipJames Lawson, MD 04/30/2016 12:37 PM

## 2016-04-30 NOTE — Transfer of Care (Signed)
Immediate Anesthesia Transfer of Care Note  Patient: Nesreen A Loth  Procedure(s) Performed: Procedure(s): Left Distal Superfical Femoral to Tibio-peroneal trunk bypass using non-reversed saphenous vein graph Left Leg (Left) Endarterectomy of Left popliteal, anterior tibial and tibio-peroneal trunk (Left) Left Leg INTRA OPERATIVE ARTERIOGRAM (Left)  Patient Location: PACU  Anesthesia Type:General  Level of Consciousness: awake and alert   Airway & Oxygen Therapy: Patient Spontanous Breathing and Patient connected to nasal cannula oxygen  Post-op Assessment: Report given to RN and Post -op Vital signs reviewed and stable  Post vital signs: Reviewed and stable  Last Vitals:  Filed Vitals:   04/30/16 0639 04/30/16 1247  BP: 151/83 146/87  Pulse: 76 106  Temp: 36.8 C 36.5 C  Resp: 20 20    Last Pain: There were no vitals filed for this visit.       Complications: No apparent anesthesia complications

## 2016-04-30 NOTE — Anesthesia Procedure Notes (Signed)
Procedure Name: Intubation Date/Time: 04/30/2016 8:44 AM Performed by: [place] Pink Pre-anesthesia Checklist: Patient identified, Emergency Drugs available, Suction available, Patient being monitored and Timeout performed Patient Re-evaluated:Patient Re-evaluated prior to inductionOxygen Delivery Method: Circle system utilized Preoxygenation: Pre-oxygenation with 100% oxygen Intubation Type: IV induction Ventilation: Mask ventilation without difficulty Laryngoscope Size: Mac and 4 Grade View: Grade I Tube type: Oral Tube size: 7.0 mm Number of attempts: 1 Airway Equipment and Method: Stylet and LTA kit utilized Placement Confirmation: ETT inserted through vocal cords under direct vision,  positive ETCO2 and breath sounds checked- equal and bilateral Secured at: 20 cm Tube secured with: Tape Dental Injury: Teeth and Oropharynx as per pre-operative assessment

## 2016-05-01 ENCOUNTER — Encounter (HOSPITAL_COMMUNITY): Payer: Self-pay | Admitting: Vascular Surgery

## 2016-05-01 ENCOUNTER — Other Ambulatory Visit: Payer: Self-pay | Admitting: *Deleted

## 2016-05-01 ENCOUNTER — Encounter (HOSPITAL_COMMUNITY): Payer: PPO

## 2016-05-01 DIAGNOSIS — Z48812 Encounter for surgical aftercare following surgery on the circulatory system: Secondary | ICD-10-CM

## 2016-05-01 DIAGNOSIS — R269 Unspecified abnormalities of gait and mobility: Secondary | ICD-10-CM | POA: Diagnosis not present

## 2016-05-01 DIAGNOSIS — I739 Peripheral vascular disease, unspecified: Secondary | ICD-10-CM

## 2016-05-01 LAB — BASIC METABOLIC PANEL
Anion gap: 10 (ref 5–15)
BUN: 8 mg/dL (ref 6–20)
CALCIUM: 9 mg/dL (ref 8.9–10.3)
CO2: 26 mmol/L (ref 22–32)
CREATININE: 0.7 mg/dL (ref 0.44–1.00)
Chloride: 102 mmol/L (ref 101–111)
GFR calc non Af Amer: >60 mL/min (ref >60)
Glucose, Bld: 142 mg/dL — ABNORMAL HIGH (ref 65–99)
Potassium: 4.3 mmol/L (ref 3.5–5.1)
Sodium: 138 mmol/L (ref 135–145)

## 2016-05-01 LAB — CBC
HEMATOCRIT: 34.3 % — AB (ref 36.0–46.0)
Hemoglobin: 11.2 g/dL — ABNORMAL LOW (ref 12.0–15.0)
MCH: 29.1 pg (ref 26.0–34.0)
MCHC: 32.7 g/dL (ref 30.0–36.0)
MCV: 89.1 fL (ref 78.0–100.0)
Platelets: 284 10*3/uL (ref 150–400)
RBC: 3.85 MIL/uL — ABNORMAL LOW (ref 3.87–5.11)
RDW: 12.6 % (ref 11.5–15.5)
WBC: 13.9 10*3/uL — ABNORMAL HIGH (ref 4.0–10.5)

## 2016-05-01 MED ORDER — NICOTINE 21 MG/24HR TD PT24
21.0000 mg | MEDICATED_PATCH | Freq: Every day | TRANSDERMAL | Status: DC
  Administered 2016-05-01: 21 mg via TRANSDERMAL
  Filled 2016-05-01: qty 1

## 2016-05-01 MED ORDER — OXYCODONE-ACETAMINOPHEN 5-325 MG PO TABS: 1.0000 | ORAL_TABLET | ORAL | Status: DC | PRN

## 2016-05-01 NOTE — Progress Notes (Signed)
Discharge instruction given to patient and spouse. Questions answered. All belongings sent home with patient. Rx given to patient. VSS. Surgical site CDI, palpable pulses. PIV DC, hemostasis achieved. eICU and CCMT notified of DC. Restrictions reinforced.

## 2016-05-01 NOTE — Evaluation (Signed)
Occupational Therapy Evaluation Patient Details Name: [name] MRN: [id number] DOB: November 13, 1951 Today's Date: 05/01/2016    History of Present Illness This 65 y.o. female admitted for  Left Distal Superfical Femoral to Tibio-peroneal trunk bypass using non-reversed saphenous vein graph Left Leg   Clinical Impression   Patient evaluated by Occupational Therapy with no further acute OT needs identified. All education has been completed and the patient has no further questions. Pt currently requires supervision for ADLs and anticipate she will progress quickly to modified independent.  She does not need any DME.  Spouse is available to assist as necessary. See below for any follow-up Occupational Therapy or equipment needs. OT is signing off. Thank you for this referral.       Follow Up Recommendations  No OT follow up;Supervision - Intermittent    Equipment Recommendations  None recommended by OT    Recommendations for Other Services       Precautions / Restrictions Precautions Precautions: None      Mobility Bed Mobility Overal bed mobility: Modified Independent                Transfers Overall transfer level: Modified independent Equipment used: Rolling walker (2 wheeled)                  Balance Overall balance assessment: No apparent balance deficits (not formally assessed)                                          ADL Overall ADL's : Needs assistance/impaired                                       General ADL Comments: Pt is able to perform all ADLs at supervision level      Vision     Perception     Praxis      Pertinent Vitals/Pain Pain Assessment: 0-10 Pain Score: 4  Pain Location: Lt LE  Pain Descriptors / Indicators: Aching Pain Intervention(s): Monitored during session     Hand Dominance Right   Extremity/Trunk Assessment Upper Extremity Assessment Upper Extremity Assessment: Overall WFL  for tasks assessed   Lower Extremity Assessment Lower Extremity Assessment: Defer to PT evaluation   Cervical / Trunk Assessment Cervical / Trunk Assessment: Normal   Communication Communication Communication: No difficulties   Cognition Arousal/Alertness: Awake/alert Behavior During Therapy: WFL for tasks assessed/performed Overall Cognitive Status: Within Functional Limits for tasks assessed                     General Comments       Exercises       Shoulder Instructions      Home Living Family/patient expects to be discharged to:: Private residence Living Arrangements: Spouse/significant other Available Help at Discharge: Family;Available 24 hours/day Type of Home: House Home Access: Stairs to enter [employer] of Steps: 3 Entrance Stairs-Rails: Right;Left;Can reach both Home Layout: One level     Bathroom Shower/Tub: Producer, television/film/video: Standard     Home Equipment: Shower seat - built in          Prior Functioning/Environment Level of Independence: Independent        Comments: pt is retired Scientist, product/process development, 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     OT Diagnosis: Acute pain   OT Problem List:     OT Treatment/Interventions:      OT Goals(Current goals can be found in the care plan section) Acute Rehab OT Goals Patient Stated Goal: to go home  OT Goal Formulation: All assessment and education complete, DC therapy  OT Frequency:     Barriers to D/C:            Co-evaluation              End of Session Equipment Utilized During Treatment: Rolling walker Nurse Communication: Mobility status  Activity Tolerance: Patient tolerated treatment well Patient left: in chair;with call bell/phone within reach;with family/visitor present   Time: [id number] OT Time Calculation (min): 19 min Charges:  OT General Charges $OT Visit: 1 Procedure OT Evaluation $OT Eval Low Complexity: 1  Procedure G-Codes:    Carnetta Losada M 05/01/2016, 11:47 AM

## 2016-05-01 NOTE — Progress Notes (Addendum)
[facility]  Subjective  - Doing well, very happy with the results.   Objective 124/77 82 97.8 F (36.6 C) (Oral) 21 95%  Intake/Output Summary (Last 24 hours) at 05/01/16 0720 Last data filed at 05/01/16 0630  Gross per 24 hour  Intake   2280 ml  Output   4800 ml  Net  -2520 ml    Palpable DP/PT left foot Incisions soft without hematoma  Heart RRR Lungs non labored breathing, cough given IS  Assessment/Planning: POD # 1 Left Distal Superfical Femoral to Tibio-peroneal trunk bypass using non-reversed saphenous vein graph Left Leg Endarterectomy of Left popliteal, anterior tibial and tibio-peroneal trunk  Will wait until she tolerates breakfast and ambulates She may be able to go home today pending pain control Disposition stable  Thomasena EdisCOLLINS, EMMA MAUREEN 05/01/2016 7:20 AM --  Laboratory Lab Results:  Recent Labs  04/30/16 2350  WBC 13.9*  HGB 11.2*  HCT 34.3*  PLT 284   BMET  Recent Labs  04/30/16 2350  NA 138  K 4.3  CL 102  CO2 26  GLUCOSE 142*  BUN 8  CREATININE 0.70  CALCIUM 9.0    COAG Lab Results  Component Value Date   INR 1.00 04/22/2016   No results found for: PTT  Agree with above assessment Surgical incisions look good with no hematoma 3+ dorsalis pedis and posterior tibial pulse palpable left foot Foley discontinued and patient tolerating diet well beginning to ambulate in halls with walker  Patient wants to be discharged today and she is taking  Minimal pain medication Will discharge home following further ambulation and return to office in 2 weeks

## 2016-05-01 NOTE — Care Management Note (Signed)
Case Management Note  Patient Details  Name: Marlene BastClara A Pharr MRN: [id number] Date of Birth: 05-Nov-1951  Subjective/Objective:  Patient is from home with spouse, s/p Fem bypass.  She will need a rolling walker, NCM spoke with patient offered her choice for rolling walker she said [facility] would be ok.  [facility] will [facility] up to patient's room.  Patient has no other needs.                   Action/Plan:   Expected Discharge Date:                  Expected Discharge Plan:  Home/Self Care  In-House Referral:     Discharge planning Services  CM Consult  Post Acute Care Choice:    Choice offered to:  Patient  DME Arranged:  Walker rolling DME Agency:  [facility]  HH Arranged:    HH Agency:     Status of Service:  Completed, signed off  Medicare Important Message Given:    Date Medicare IM Given:    Medicare IM give by:    Date Additional Medicare IM Given:    Additional Medicare Important Message give by:     If discussed at Long Length of Stay Meetings, dates discussed:    Additional Comments:  Leone Havenaylor, Kristofer Schaffert [place], RN 05/01/2016, 11:30 AM

## 2016-05-01 NOTE — Care Management Note (Deleted)
Case Management Note  Patient Details  Name: [name] MRN: [id number] Date of Birth: 03/31/1951  Subjective/Objective:    Patient is from home with spouse, s/p Fem bypass. She will need a rolling walker, NCM spoke with patient offered her choice for rolling walker she said [facility] would be ok. [facility] to patient's room. Patient has no other needs.            Action/Plan:   Expected Discharge Date:                  Expected Discharge Plan:  Home/Self Care  In-House Referral:     Discharge planning Services  CM Consult  Post Acute Care Choice:    Choice offered to:  Patient  DME Arranged:  Walker rolling DME Agency:  [facility]  HH Arranged:    HH Agency:     Status of Service:  Completed, signed off  Medicare Important Message Given:    Date Medicare IM Given:    Medicare IM give by:    Date Additional Medicare IM Given:    Additional Medicare Important Message give by:     If discussed at Long Length of Stay Meetings, dates discussed:    Additional Comments:  Leone Havenaylor, Ainsley Sanguinetti [place], RN 05/01/2016, 11:34 AM

## 2016-05-05 ENCOUNTER — Telehealth: Payer: Self-pay | Admitting: Vascular Surgery

## 2016-05-05 NOTE — Telephone Encounter (Signed)
-----   Message from Sharee PimpleMarilyn K McChesney, RN sent at 05/01/2016  1:05 PM EDT ----- Regarding: schedule   ----- Message -----    From: Lars MageEmma M Collins, PA-C    Sent: 05/01/2016  10:37 AM      To: [facility] Charge Pool  F/U with Dr. Hart RochesterLawson in 2 weeks needs left LE arterial duplex too look for arterial occlusions duplex as well as ABIs's s/p left sfa-AT by pass

## 2016-05-05 NOTE — Telephone Encounter (Signed)
sched lab 5/10 at 8:00 and md 5/16 at 9:15. Spoke to pt to inform her of appts.

## 2016-05-06 NOTE — Discharge Summary (Signed)
[facility] Discharge Summary   Patient ID:  [name] MRN: [id number] DOB/AGE: [birth year] 65 y.o.  Admit date: 04/30/2016 Discharge date: 05/01/2016 Date of Surgery: 04/30/2016 Surgeon: Surgeon(s): Pryor Ochoa, MD  Admission Diagnosis: Peripheral vascular disease with left lower extremity claudication I70.212  Discharge Diagnoses:  Peripheral vascular disease with left lower extremity claudication I70.212  Secondary Diagnoses: Past Medical History  Diagnosis Date  . Pneumonia     hx of 2016   . GERD (gastroesophageal reflux disease)   . Peripheral vascular disease (HCC)     see 12/03/15- US arterial- shows borderline PAD   . Multiple allergies   . Hyperlipidemia     Procedure(s): Left Distal Superfical Femoral to Tibio-peroneal trunk bypass using non-reversed saphenous vein graph Left Leg Endarterectomy of Left popliteal, anterior tibial and tibio-peroneal trunk Left Leg INTRA OPERATIVE ARTERIOGRAM  Discharged Condition: good  HPI: this 65 year old female returns to discuss the findings of her angiogram performed by Dr. Myra Gianotti a few weeks ago. She continues to have severe claudication in the left calf which limits her to walking about 40-50 yards which time she must stop and rest for a few minutes before resuming. She has no history of nonhealing ulcers or infection. She does have some rest discomfort at times. She has no symptoms in the contralateral right leg. She has no history of coronary artery disease diabetes or CVA.    Hospital Course:  [name] is a 65 y.o. female is S/P Procedure(s): Left Distal Superfical Femoral to Tibio-peroneal trunk bypass using non-reversed saphenous vein graph Left Leg Endarterectomy of Left popliteal, anterior tibial and tibio-peroneal trunk Left Leg INTRA OPERATIVE ARTERIOGRAM  POD#1 Palpable DP/PT left foot Incisions soft without hematoma  Heart RRR Lungs non labored breathing, cough given  IS  Assessment/Planning: POD # 1 Left Distal Superfical Femoral to Tibio-peroneal trunk bypass using non-reversed saphenous vein graph Left Leg Endarterectomy of Left popliteal, anterior tibial and tibio-peroneal trunk  She has ambulated, voided and pain was well controlled Disposition stable for discharge home.  She will f/U in 2 weeks with Dr. Hart Rochester.  Significant Diagnostic Studies: CBC Lab Results  Component Value Date   WBC 13.9* 04/30/2016   HGB 11.2* 04/30/2016   HCT 34.3* 04/30/2016   MCV 89.1 04/30/2016   PLT 284 04/30/2016    BMET    Component Value Date/Time   NA 138 04/30/2016 2350   K 4.3 04/30/2016 2350   CL 102 04/30/2016 2350   CO2 26 04/30/2016 2350   GLUCOSE 142* 04/30/2016 2350   BUN 8 04/30/2016 2350   CREATININE 0.70 04/30/2016 2350   CREATININE 0.63 06/05/2011 1221   CALCIUM 9.0 04/30/2016 2350   GFRNONAA >60 04/30/2016 2350   GFRNONAA >60 06/05/2011 1221   GFRAA >60 04/30/2016 2350   GFRAA >60 06/05/2011 1221   COAG Lab Results  Component Value Date   INR 1.00 04/22/2016     Disposition:  Discharge to :Home Discharge Instructions    Call MD for:  redness, tenderness, or signs of infection (pain, swelling, bleeding, redness, odor or green/yellow discharge around incision site)    Complete by:  As directed      Call MD for:  severe or increased pain, loss or decreased feeling  in affected limb(s)    Complete by:  As directed      Call MD for:  temperature >100.5    Complete by:  As directed      Discharge instructions  Complete by:  As directed   You may shower in 24 hours.  Don't soak in a hot tub of water.     Discharge patient    Complete by:  As directed   Discharge pt to home     Driving Restrictions    Complete by:  As directed   No driving for 1 week     Lifting restrictions    Complete by:  As directed   No heavy lifting for 6 weeks     Resume previous diet    Complete by:  As directed             Medication List     TAKE these medications        acetaminophen 500 MG tablet  Commonly known as:  TYLENOL  Take 500 mg by mouth 2 (two) times daily.     aspirin EC 81 MG tablet  Take 81 mg by mouth daily.     levocetirizine 5 MG tablet  Commonly known as:  XYZAL  Take 5 mg by mouth daily.     levocetirizine 5 MG tablet  Commonly known as:  XYZAL  Take 5 mg by mouth daily.     montelukast 10 MG tablet  Commonly known as:  SINGULAIR  Take 10 mg by mouth at bedtime.     oxyCODONE-acetaminophen 5-325 MG tablet  Commonly known as:  PERCOCET/ROXICET  Take 1-2 tablets by mouth every 4 (four) hours as needed for moderate pain.     pantoprazole 40 MG tablet  Commonly known as:  PROTONIX  Take 40 mg by mouth daily before breakfast.     triamcinolone 55 MCG/ACT Aero nasal inhaler  Commonly known as:  NASACORT  Place 1 spray into both nostrils daily. Reported on 03/11/2016       Verbal and written Discharge instructions given to the patient. Wound care per Discharge AVS     Follow-up Information    Follow up with Josephina GipLawson, James, MD In 2 weeks.   Specialties:  Vascular Surgery, Interventional Cardiology, Cardiology   Why:  Office will call you to arrange your appt (sent)   Contact information:   [address] [place] [address] [phone]       Follow up with [facility].   Why:  rolling walker   Contact information:   [address] [place] [address] [phone]       Signed: Clinton GallantCOLLINS, Shayonna Ocampo [facility] 05/06/2016, 9:00 AM  - For VQI Registry use --- Instructions: Press F2 to tab through selections.  Delete question if not applicable.   Post-op:  Wound infection: No  Graft infection: No  Transfusion: No  If yes, 0 units given New Arrhythmia: No Ipsilateral amputation: [x ] no, [ ]  Minor, [ ]  BKA, [ ]  AKA Discharge patency: [x ] Primary, [ ]  Primary assisted, [ ]  Secondary, [ ]  Occluded Patency judged by: [ ]  Dopper only, [x ] Palpable graft pulse, [ ]   Palpable distal pulse, [ ]  ABI inc. > 0.15, [ ]  Duplex  D/C Ambulatory Status: Ambulatory  Complications: MI: [x ] No, [ ]  Troponin only, [ ]  EKG or Clinical CHF: No Resp failure: [x ] none, [ ]  Pneumonia, [ ]  Ventilator Chg in renal function: x[ ]  none, [ ]  Inc. Cr > 0.5, [ ]  Temp. Dialysis, [ ]  Permanent dialysis Stroke: [x ] None, [ ]  Minor, [ ]  Major Return to OR: No  Reason for return to  OR: [ ]  Bleeding, [ ]  Infection, [ ]  Thrombosis, [ ]  Revision  Discharge medications: Statin use:  No  for medical reason   ASA use:  Yes Plavix use:  No  for medical reason   Beta blocker use: No  for medical reason   Coumadin use: No  for medical reason

## 2016-05-07 ENCOUNTER — Ambulatory Visit (INDEPENDENT_AMBULATORY_CARE_PROVIDER_SITE_OTHER)
Admission: RE | Admit: 2016-05-07 | Discharge: 2016-05-07 | Disposition: A | Discharge status: Home / Self Care | Payer: PPO | Source: Ambulatory Visit | Attending: Vascular Surgery | Admitting: Vascular Surgery

## 2016-05-07 ENCOUNTER — Ambulatory Visit (HOSPITAL_COMMUNITY)
Admission: RE | Admit: 2016-05-07 | Discharge: 2016-05-07 | Disposition: A | Discharge status: Home / Self Care | Payer: PPO | Source: Ambulatory Visit | Attending: Vascular Surgery | Admitting: Vascular Surgery

## 2016-05-07 DIAGNOSIS — K219 Gastro-esophageal reflux disease without esophagitis: Secondary | ICD-10-CM | POA: Diagnosis not present

## 2016-05-07 DIAGNOSIS — Z48812 Encounter for surgical aftercare following surgery on the circulatory system: Secondary | ICD-10-CM | POA: Diagnosis not present

## 2016-05-07 DIAGNOSIS — I739 Peripheral vascular disease, unspecified: Secondary | ICD-10-CM

## 2016-05-07 DIAGNOSIS — E785 Hyperlipidemia, unspecified: Secondary | ICD-10-CM | POA: Insufficient documentation

## 2016-05-07 DIAGNOSIS — R0989 Other specified symptoms and signs involving the circulatory and respiratory systems: Secondary | ICD-10-CM | POA: Diagnosis not present

## 2016-05-08 ENCOUNTER — Encounter: Payer: Self-pay | Admitting: Vascular Surgery

## 2016-05-12 ENCOUNTER — Encounter: Payer: PPO | Admitting: Surgery

## 2016-05-13 ENCOUNTER — Ambulatory Visit (INDEPENDENT_AMBULATORY_CARE_PROVIDER_SITE_OTHER): Payer: PPO | Admitting: Vascular Surgery

## 2016-05-13 ENCOUNTER — Encounter: Payer: Self-pay | Admitting: Vascular Surgery

## 2016-05-13 VITALS — BP 141/80 | HR 80 | Temp 97.3°F | Resp 16 | Ht 63.0 in | Wt 156.0 lb

## 2016-05-13 DIAGNOSIS — I739 Peripheral vascular disease, unspecified: Secondary | ICD-10-CM

## 2016-05-13 NOTE — Progress Notes (Signed)
Filed Vitals:   05/13/16 0935 05/13/16 0937  BP: 148/84 141/80  Pulse: 80 80  Temp: 97.3 F (36.3 C)   Resp: 16   Height: 5\' 3"  (1.6 m)   Weight: 156 lb (70.761 kg)   SpO2: 98%

## 2016-05-13 NOTE — Progress Notes (Signed)
Subjective:     Patient ID: [name], female   DOB: 11/25/1951, 65 y.o.   MRN: [id number]  HPI this 65 year old female returns 2 weeks post left distal superficial femoral to tibioperoneal trunk bypass for severe debilitating claudication symptoms in the left leg. She did not have adequate length of saphenous vein to originate from the common femoral artery. She did have an isolated mild-to-moderate stenosis in the superficial femoral artery proximal to the origin of her bypass. She has had an excellent pulse in the left foot since her surgery but some postoperative edema has been present. Her claudication symptoms are completely resolved. She does have some aching discomfort in the incisions.   Review of Systems     Objective:   Physical Exam BP 141/80 mmHg  Pulse 80  Temp(Src) 97.3 F (36.3 C)  Resp 16  Ht 5\' 3"  (1.6 m)  Wt 156 lb (70.761 kg)  BMI 27.64 kg/m2  SpO2 98%  Gen. well-developed well-nourished female no apparent distress alert and oriented 3 Left leg with nicely healing incisions from the inguinal area to the mid calf with 3+ dorsalis pedis pulse palpable. 1-2+ edema from mid thigh distally. Good motion and sensation of the left foot tenderness.  Last week we attempted duplex scanning of the left leg to visualize the superficial femoral artery proximal to the bypass but because of the postoperative edema this was not visualized well. The graft itself was well visualized in the ABIs 0.97 with triphasic flow.     Assessment:     Status post left distal superficial femoral to tibioperoneal trunk bypass-widely patent with complete resolution of claudication Postoperative edema Isolated moderate stenosis in superficial femoral artery proximal to origin of bypass which may need further evaluation with angiography and possible PTA or stenting in the future    Plan:     Patient return in 3 months to see Dr. Lemar LivingsBrandon [facility]We'll obtain duplex scan of left leg as well as  left bypass to see if superficial femoral artery lesion is visualized If patient develops any recurrent claudication symptoms she will be in touch with us Patient has completely quit smoking since the day of her surgery and is very excited about this accomplishment and states she will never smoked another cigarette

## 2016-05-20 ENCOUNTER — Telehealth: Payer: Self-pay | Admitting: *Deleted

## 2016-05-20 NOTE — Telephone Encounter (Signed)
Patient called to request pain medication to take at night "so she can rest". She was rx'd # 15 Percocet 5-325mg  at discharge on 05-01-16 (Left fem-tibioperonal Bypass on 04-30-16) by Dr. Hart RochesterLawson. At her postop appt on 05-13-16, she had no claudication pain and only reported some aching in the incisions. She was to see us in 3 months for follow up.  I looked in the [facility] Database and the patient evidently is on a pain contract with [facility] (Dr. Ethelene Halamos rx'd #60 Percocet 10-325mg  on 05-06-16) She has been getting monthly rxs for the last 6 months from them.   I called patient and she did admit that she was getting Rxs from Dr. Ethelene Halamos "but she didn't like being on the 10-325mg  and only wanted 5-325mg ". I asked patient to call over there and discuss this change with them since she is on contract. She voiced agreement and understanding that only 1 doctor should be prescribing opiates. Our doctors do not prescribed opiates on a long term basis.

## 2016-05-30 DIAGNOSIS — Z9889 Other specified postprocedural states: Secondary | ICD-10-CM | POA: Diagnosis not present

## 2016-05-30 DIAGNOSIS — M5442 Lumbago with sciatica, left side: Secondary | ICD-10-CM | POA: Diagnosis not present

## 2016-05-30 DIAGNOSIS — G8929 Other chronic pain: Secondary | ICD-10-CM | POA: Diagnosis not present

## 2016-06-16 ENCOUNTER — Telehealth: Payer: Self-pay

## 2016-06-16 NOTE — Telephone Encounter (Signed)
Pt. Called to report she has continued swelling in left left, from foot to the knee.  Stated the bottom of her left foot and toes feel numb.  Stated she is able to move her left foot and toes without difficulty.  Reported she has some soreness in her ankles.  Denied any pain/ tenderness in her calf.  Stated the swelling of left leg improves somewhat with elevation.  Reported her left foot is warm, and pink/lt. .  Denied any pain at rest or with activity.  Stated she wanted to report her symptoms to Dr. Hart RochesterLawson.  Advised nurse will make Dr. Hart RochesterLawson aware.  Encouraged her to continue to elevate the left LE at intervals, to manage the swelling.  Advised she will be notified of any recommendations made by Dr Hart RochesterLawson.  Agreed.

## 2016-06-17 NOTE — Telephone Encounter (Signed)
Discussed pt's symptoms with Dr. Hart RochesterLawson.  Recommended to continue to monitor for any signs with claudication; calf pain with walking, or heavy, tired feeling in left leg.  Advised to continue to elevate the left leg intermittently above level of heart. And also recommended to maintain mobility.  Stated the numbness, in the sole of the (L) foot, may be related to ischemia to the foot, prior to the bypass.  Pt. notified of Dr. Candie ChromanLawson's recommendations, and advised to call office if symptoms worsen.  Verb. Understanding.

## 2016-07-30 DIAGNOSIS — M5442 Lumbago with sciatica, left side: Secondary | ICD-10-CM | POA: Diagnosis not present

## 2016-07-30 DIAGNOSIS — Z9889 Other specified postprocedural states: Secondary | ICD-10-CM | POA: Diagnosis not present

## 2016-07-30 DIAGNOSIS — Z720 Tobacco use: Secondary | ICD-10-CM | POA: Diagnosis not present

## 2016-08-06 ENCOUNTER — Other Ambulatory Visit: Payer: Self-pay | Admitting: *Deleted

## 2016-08-06 DIAGNOSIS — Z48812 Encounter for surgical aftercare following surgery on the circulatory system: Secondary | ICD-10-CM

## 2016-08-06 DIAGNOSIS — I739 Peripheral vascular disease, unspecified: Secondary | ICD-10-CM

## 2016-08-08 ENCOUNTER — Ambulatory Visit (HOSPITAL_COMMUNITY)
Admission: RE | Admit: 2016-08-08 | Discharge: 2016-08-08 | Disposition: A | Discharge status: Home / Self Care | Payer: PPO | Source: Ambulatory Visit | Attending: Vascular Surgery | Admitting: Vascular Surgery

## 2016-08-08 DIAGNOSIS — I739 Peripheral vascular disease, unspecified: Secondary | ICD-10-CM | POA: Diagnosis not present

## 2016-08-08 DIAGNOSIS — Z48812 Encounter for surgical aftercare following surgery on the circulatory system: Secondary | ICD-10-CM | POA: Diagnosis not present

## 2016-08-08 DIAGNOSIS — K219 Gastro-esophageal reflux disease without esophagitis: Secondary | ICD-10-CM | POA: Diagnosis not present

## 2016-08-08 DIAGNOSIS — E785 Hyperlipidemia, unspecified: Secondary | ICD-10-CM | POA: Insufficient documentation

## 2016-08-08 DIAGNOSIS — R0989 Other specified symptoms and signs involving the circulatory and respiratory systems: Secondary | ICD-10-CM | POA: Diagnosis not present

## 2016-08-12 ENCOUNTER — Encounter: Payer: Self-pay | Admitting: Vascular Surgery

## 2016-08-15 ENCOUNTER — Encounter: Payer: Self-pay | Admitting: Vascular Surgery

## 2016-08-15 ENCOUNTER — Other Ambulatory Visit: Payer: Self-pay

## 2016-08-15 ENCOUNTER — Ambulatory Visit (INDEPENDENT_AMBULATORY_CARE_PROVIDER_SITE_OTHER): Payer: PPO | Admitting: Vascular Surgery

## 2016-08-15 VITALS — BP 136/87 | HR 78 | Temp 97.0°F | Resp 16 | Ht 62.0 in | Wt 154.0 lb

## 2016-08-15 DIAGNOSIS — I739 Peripheral vascular disease, unspecified: Secondary | ICD-10-CM | POA: Diagnosis not present

## 2016-08-15 NOTE — Progress Notes (Signed)
Patient ID: [name], female   DOB: 10-22-1951, 65 y.o.   MRN: [id number]  Reason for Consult: PAD (c/o left leg pain and swelling)   Referred by Elfredia NevinsFusco, Lawrence, MD  Subjective:     HPI:  [name] is a 65 y.o. female is here for f/u left ak popliteal to tp trunk bypass with nrsv last May with Dr. Hart RochesterLawson. She continues to have edema of her lle although it is improving. The preop pain has resolved. On aspirin daily. Current smoker.  Past Medical History:  Diagnosis Date  . GERD (gastroesophageal reflux disease)   . Hyperlipidemia   . Multiple allergies   . Peripheral vascular disease (HCC)    see 12/03/15- US arterial- shows borderline PAD   . Pneumonia    hx of 2016    No family history on file. Past Surgical History:  Procedure Laterality Date  . ABDOMINAL HYSTERECTOMY    . CHOLECYSTECTOMY    . ENDARTERECTOMY FEMORAL Left 04/30/2016   Procedure: Endarterectomy of Left popliteal, anterior tibial and tibio-peroneal trunk;  Surgeon: Pryor OchoaJames D Lawson, MD;  [facility];  Service: Vascular;  Laterality: Left;  . FEMORAL-POPLITEAL BYPASS GRAFT Left 04/30/2016   Procedure: Left Distal Superfical Femoral to Tibio-peroneal trunk bypass using non-reversed saphenous vein graph Left Leg;  Surgeon: Pryor OchoaJames D Lawson, MD;  [facility];  Service: Vascular;  Laterality: Left;  . INTRAOPERATIVE ARTERIOGRAM Left 04/30/2016   Procedure: Left Leg INTRA OPERATIVE ARTERIOGRAM;  Surgeon: Pryor OchoaJames D Lawson, MD;  [facility];  Service: Vascular;  Laterality: Left;  . LUMBAR LAMINECTOMY/DECOMPRESSION MICRODISCECTOMY Left 12/26/2015   Procedure: LUMBAR LAMINECTOMY/DECOMPRESSION MICRODISCECTOMY 1 LEVEL L4-5 ON LEFT ;  Surgeon: Jene EveryJeffrey Beane, MD;  Location: [facility] ORS;  Service: Orthopedics;  Laterality: Left;  . PERIPHERAL VASCULAR CATHETERIZATION N/A 03/25/2016   Procedure: Abdominal Aortogram w/Lower Extremity;  Surgeon: Nada LibmanVance W Brabham, MD;  Location: [facility] INVASIVE CV LAB;  Service: Cardiovascular;   Laterality: N/A;  . right ovary removal       Short Social History:  Social History  Substance Use Topics  . Smoking status: Current Every Day Smoker    Packs/day: 1.00    Years: 45.00    Types: Cigarettes  . Smokeless tobacco: Never Used     Comment: 3 per day  . Alcohol use No    Allergies  Allergen Reactions  . Clarithromycin Other (See Comments)    Makes her nervous  . Levofloxacin Other (See Comments)    nervousness  . Red Dye Hives and Itching    Current Outpatient Prescriptions  Medication Sig Dispense Refill  . acetaminophen (TYLENOL) 500 MG tablet Take 500 mg by mouth 2 (two) times daily.    Marland Kitchen. aspirin EC 81 MG tablet Take 81 mg by mouth daily.    Marland Kitchen. levocetirizine (XYZAL) 5 MG tablet Take 5 mg by mouth daily.    Marland Kitchen. levocetirizine (XYZAL) 5 MG tablet Take 5 mg by mouth daily.    . montelukast (SINGULAIR) 10 MG tablet Take 10 mg by mouth at bedtime.    . pantoprazole (PROTONIX) 40 MG tablet Take 40 mg by mouth daily before breakfast.     . triamcinolone (NASACORT) 55 MCG/ACT AERO nasal inhaler Place 1 spray into both nostrils daily. Reported on 03/11/2016    . oxyCODONE-acetaminophen (PERCOCET/ROXICET) 5-325 MG tablet Take 1-2 tablets by mouth every 4 (four) hours as needed for moderate pain. (Patient not taking: Reported on 08/15/2016) 15 tablet 0   No current  facility-administered medications for this visit.     Review of Systems  Constitutional:  Constitutional negative. Respiratory: Respiratory negative.  Cardiovascular: Cardiovascular negative.  GI: Gastrointestinal negative.       Previous abdominal pain with cholesterol medication Musculoskeletal:       LLE edema and ankle pain       Objective:  Objective   Vitals:   08/15/16 0910  BP: 136/87  Pulse: 78  Resp: 16  Temp: 97 F (36.1 C)  SpO2: 97%  Weight: 154 lb (69.9 kg)  Height: 5\' 2"  (1.575 m)   Body mass index is 28.17 kg/m.  Physical Exam  Data: Reviewed ABI noted to be decreased to  0.88/0.87.  Graft duplex with velocity <40 in proximal graft     Assessment/Plan:   64yo here for 3 month f/u from recent ak pop to tp trunk bypass on left for rest pain with Dr. Hart RochesterLawson. Edema resolving although still causing pain for which she continues to elevate the leg and I have instructed to wear compression when ambulating during the day. Her preop pain has resolved but her ABI's have decreased since immediately post op and her vein graft has decreased velocity and I am concerned for graft failure. For this reason we will schedule for angiogram in near future. She understands the risks and benefits and wants to proceed on next Wednesday.     Maeola HarmanBrandon Christopher Cain MD [facility]

## 2016-08-20 ENCOUNTER — Encounter (HOSPITAL_COMMUNITY): Payer: Self-pay | Admitting: Vascular Surgery

## 2016-08-20 ENCOUNTER — Other Ambulatory Visit: Payer: Self-pay | Admitting: *Deleted

## 2016-08-20 ENCOUNTER — Encounter (HOSPITAL_COMMUNITY)
Admission: RE | Disposition: A | Discharge status: Home / Self Care | Payer: Self-pay | Source: Ambulatory Visit | Attending: Vascular Surgery

## 2016-08-20 ENCOUNTER — Ambulatory Visit (HOSPITAL_COMMUNITY)
Admission: RE | Admit: 2016-08-20 | Discharge: 2016-08-20 | Disposition: A | Discharge status: Home / Self Care | Payer: PPO | Source: Ambulatory Visit | Attending: Vascular Surgery | Admitting: Vascular Surgery

## 2016-08-20 DIAGNOSIS — I70222 Atherosclerosis of native arteries of extremities with rest pain, left leg: Secondary | ICD-10-CM | POA: Diagnosis not present

## 2016-08-20 DIAGNOSIS — I739 Peripheral vascular disease, unspecified: Secondary | ICD-10-CM

## 2016-08-20 DIAGNOSIS — Z7951 Long term (current) use of inhaled steroids: Secondary | ICD-10-CM | POA: Insufficient documentation

## 2016-08-20 DIAGNOSIS — E785 Hyperlipidemia, unspecified: Secondary | ICD-10-CM | POA: Diagnosis not present

## 2016-08-20 DIAGNOSIS — K219 Gastro-esophageal reflux disease without esophagitis: Secondary | ICD-10-CM | POA: Insufficient documentation

## 2016-08-20 DIAGNOSIS — F17210 Nicotine dependence, cigarettes, uncomplicated: Secondary | ICD-10-CM | POA: Diagnosis not present

## 2016-08-20 DIAGNOSIS — Z7982 Long term (current) use of aspirin: Secondary | ICD-10-CM | POA: Diagnosis not present

## 2016-08-20 HISTORY — PX: PERIPHERAL VASCULAR CATHETERIZATION: SHX172C

## 2016-08-20 LAB — POCT I-STAT, CHEM 8
BUN: 9 mg/dL (ref 6–20)
CALCIUM ION: 1.17 mmol/L (ref 1.12–1.23)
CREATININE: 0.6 mg/dL (ref 0.44–1.00)
Chloride: 106 mmol/L (ref 101–111)
GLUCOSE: 107 mg/dL — AB (ref 65–99)
HCT: 43 % (ref 36.0–46.0)
HEMOGLOBIN: 14.6 g/dL (ref 12.0–15.0)
POTASSIUM: 4.3 mmol/L (ref 3.5–5.1)
Sodium: 141 mmol/L (ref 135–145)
TCO2: 25 mmol/L (ref 0–100)

## 2016-08-20 LAB — POCT ACTIVATED CLOTTING TIME
ACTIVATED CLOTTING TIME: 213 s
Activated Clotting Time: 202 seconds

## 2016-08-20 SURGERY — ABDOMINAL AORTOGRAM W/LOWER EXTREMITY
Anesthesia: LOCAL

## 2016-08-20 MED ORDER — SODIUM CHLORIDE 0.9 % IV SOLN
INTRAVENOUS | Status: DC
  Administered 2016-08-20: 07:00:00 via INTRAVENOUS

## 2016-08-20 MED ORDER — TRIAMCINOLONE ACETONIDE 55 MCG/ACT NA AERO
1.0000 | INHALATION_SPRAY | Freq: Every day | NASAL | Status: DC
Start: 2016-08-20 — End: 2016-08-20

## 2016-08-20 MED ORDER — ASPIRIN EC 81 MG PO TBEC: 81.0000 mg | DELAYED_RELEASE_TABLET | Freq: Every day | ORAL | Status: DC

## 2016-08-20 MED ORDER — CLOPIDOGREL BISULFATE 75 MG PO TABS: 75.0000 mg | ORAL_TABLET | Freq: Every day | ORAL | 2 refills | Status: DC

## 2016-08-20 MED ORDER — CLOPIDOGREL BISULFATE 75 MG PO TABS
300.0000 mg | ORAL_TABLET | Freq: Once | ORAL | Status: AC
  Administered 2016-08-20: 300 mg via ORAL

## 2016-08-20 MED ORDER — ACETAMINOPHEN 325 MG PO TABS: 650.0000 mg | ORAL_TABLET | ORAL | Status: DC | PRN

## 2016-08-20 MED ORDER — ONDANSETRON HCL 4 MG/2ML IJ SOLN: 4.0000 mg | Freq: Four times a day (QID) | INTRAMUSCULAR | Status: DC | PRN

## 2016-08-20 MED ORDER — CLOPIDOGREL BISULFATE 75 MG PO TABS: 75.0000 mg | ORAL_TABLET | Freq: Every day | ORAL | Status: DC

## 2016-08-20 MED ORDER — PANTOPRAZOLE SODIUM 40 MG PO TBEC: 40.0000 mg | DELAYED_RELEASE_TABLET | Freq: Every day | ORAL | Status: DC

## 2016-08-20 MED ORDER — MONTELUKAST SODIUM 10 MG PO TABS: 10.0000 mg | ORAL_TABLET | Freq: Every day | ORAL | Status: DC

## 2016-08-20 MED ORDER — FENTANYL CITRATE (PF) 100 MCG/2ML IJ SOLN
INTRAMUSCULAR | Status: AC
  Filled 2016-08-20: qty 2

## 2016-08-20 MED ORDER — LIDOCAINE HCL (PF) 1 % IJ SOLN
INTRAMUSCULAR | Status: DC | PRN
  Administered 2016-08-20: 12 mL via SUBCUTANEOUS

## 2016-08-20 MED ORDER — ACETAMINOPHEN 500 MG PO TABS: 500.0000 mg | ORAL_TABLET | Freq: Two times a day (BID) | ORAL | Status: DC

## 2016-08-20 MED ORDER — FENTANYL CITRATE (PF) 100 MCG/2ML IJ SOLN
INTRAMUSCULAR | Status: DC | PRN
  Administered 2016-08-20: 50 ug via INTRAVENOUS

## 2016-08-20 MED ORDER — MORPHINE SULFATE (PF) 2 MG/ML IV SOLN: 1.0000 mg | INTRAVENOUS | Status: DC | PRN

## 2016-08-20 MED ORDER — HEPARIN SODIUM (PORCINE) 1000 UNIT/ML IJ SOLN
INTRAMUSCULAR | Status: DC | PRN
  Administered 2016-08-20: 6000 [IU] via INTRAVENOUS
  Administered 2016-08-20: 2000 [IU] via INTRAVENOUS

## 2016-08-20 MED ORDER — LIDOCAINE HCL (PF) 1 % IJ SOLN
INTRAMUSCULAR | Status: AC
  Filled 2016-08-20: qty 30

## 2016-08-20 MED ORDER — MIDAZOLAM HCL 2 MG/2ML IJ SOLN
INTRAMUSCULAR | Status: AC
  Filled 2016-08-20: qty 2

## 2016-08-20 MED ORDER — PROTAMINE SULFATE 10 MG/ML IV SOLN
INTRAVENOUS | Status: DC | PRN
  Administered 2016-08-20: 15 mg via INTRAVENOUS
  Administered 2016-08-20: 5 mg via INTRAVENOUS

## 2016-08-20 MED ORDER — LEVOCETIRIZINE DIHYDROCHLORIDE 5 MG PO TABS: 5.0000 mg | ORAL_TABLET | Freq: Every day | ORAL | Status: DC

## 2016-08-20 MED ORDER — HEPARIN (PORCINE) IN NACL 2-0.9 UNIT/ML-% IJ SOLN
INTRAMUSCULAR | Status: DC | PRN
  Administered 2016-08-20: 1000 mL via INTRA_ARTERIAL

## 2016-08-20 MED ORDER — HEPARIN SODIUM (PORCINE) 1000 UNIT/ML IJ SOLN
INTRAMUSCULAR | Status: AC
  Filled 2016-08-20: qty 1

## 2016-08-20 MED ORDER — HEPARIN (PORCINE) IN NACL 2-0.9 UNIT/ML-% IJ SOLN
INTRAMUSCULAR | Status: AC
  Filled 2016-08-20: qty 1000

## 2016-08-20 MED ORDER — IODIXANOL 320 MG/ML IV SOLN
INTRAVENOUS | Status: DC | PRN
  Administered 2016-08-20: 90 mL via INTRA_ARTERIAL

## 2016-08-20 MED ORDER — MIDAZOLAM HCL 2 MG/2ML IJ SOLN
INTRAMUSCULAR | Status: DC | PRN
  Administered 2016-08-20: 1 mg via INTRAVENOUS

## 2016-08-20 MED ORDER — CLOPIDOGREL BISULFATE 300 MG PO TABS
ORAL_TABLET | ORAL | Status: AC
  Filled 2016-08-20: qty 1

## 2016-08-20 MED ORDER — OXYCODONE-ACETAMINOPHEN 5-325 MG PO TABS
1.0000 | ORAL_TABLET | ORAL | Status: DC | PRN
Start: 2016-08-20 — End: 2016-08-20

## 2016-08-20 MED ORDER — PROTAMINE SULFATE 10 MG/ML IV SOLN
INTRAVENOUS | Status: AC
  Filled 2016-08-20: qty 5

## 2016-08-20 SURGICAL SUPPLY — 18 items
BALLN IN.PACT DCB 5X40 (BALLOONS) ×3
BALLN MUSTANG 4X40X135 (BALLOONS) ×3
BALLOON MUSTANG 4X40X135 (BALLOONS) ×2 IMPLANT
CATH OMNI FLUSH 5F 65CM (CATHETERS) ×3 IMPLANT
CATH QUICKCROSS SUPP .035X90CM (MICROCATHETER) ×3 IMPLANT
DCB IN.PACT 5X40 (BALLOONS) ×2 IMPLANT
GUIDEWIRE ANGLED .035X260CM (WIRE) ×3 IMPLANT
KIT ENCORE 26 ADVANTAGE (KITS) ×3 IMPLANT
KIT MICROINTRODUCER STIFF 5F (SHEATH) ×3 IMPLANT
KIT PV (KITS) ×3 IMPLANT
SHEATH HIGHFLEX ANSEL 6FRX55 (SHEATH) ×3 IMPLANT
SHEATH PINNACLE 5F 10CM (SHEATH) ×3 IMPLANT
SHEATH PINNACLE R/O II 5F 6CM (SHEATH) ×3 IMPLANT
SYR MEDRAD MARK V 150ML (SYRINGE) ×3 IMPLANT
TRANSDUCER W/STOPCOCK (MISCELLANEOUS) ×3 IMPLANT
TRAY PV CATH (CUSTOM PROCEDURE TRAY) ×3 IMPLANT
WIRE BENTSON .035X145CM (WIRE) ×3 IMPLANT
WIRE HI TORQ VERSACORE J 260CM (WIRE) ×3 IMPLANT

## 2016-08-20 NOTE — H&P (Signed)
H+P    History of Present Illness: [name] is a 65 y.o. female is here for f/u left ak popliteal to tp trunk bypass with nrsv last May with Dr. Hart RochesterLawson. She continues to have edema of her lle although it is improving. The preop pain has resolved. On aspirin daily. Current smoker.  Past Medical History:  Diagnosis Date  . GERD (gastroesophageal reflux disease)   . Hyperlipidemia   . Multiple allergies   . Peripheral vascular disease (HCC)    see 12/03/15- US arterial- shows borderline PAD   . Pneumonia    hx of 2016     Past Surgical History:  Procedure Laterality Date  . ABDOMINAL HYSTERECTOMY    . CHOLECYSTECTOMY    . ENDARTERECTOMY FEMORAL Left 04/30/2016   Procedure: Endarterectomy of Left popliteal, anterior tibial and tibio-peroneal trunk;  Surgeon: Pryor OchoaJames D Lawson, MD;  [facility];  Service: Vascular;  Laterality: Left;  . FEMORAL-POPLITEAL BYPASS GRAFT Left 04/30/2016   Procedure: Left Distal Superfical Femoral to Tibio-peroneal trunk bypass using non-reversed saphenous vein graph Left Leg;  Surgeon: Pryor OchoaJames D Lawson, MD;  [facility];  Service: Vascular;  Laterality: Left;  . INTRAOPERATIVE ARTERIOGRAM Left 04/30/2016   Procedure: Left Leg INTRA OPERATIVE ARTERIOGRAM;  Surgeon: Pryor OchoaJames D Lawson, MD;  [facility];  Service: Vascular;  Laterality: Left;  . LUMBAR LAMINECTOMY/DECOMPRESSION MICRODISCECTOMY Left 12/26/2015   Procedure: LUMBAR LAMINECTOMY/DECOMPRESSION MICRODISCECTOMY 1 LEVEL L4-5 ON LEFT ;  Surgeon: Jene EveryJeffrey Beane, MD;  Location: [facility] ORS;  Service: Orthopedics;  Laterality: Left;  . PERIPHERAL VASCULAR CATHETERIZATION N/A 03/25/2016   Procedure: Abdominal Aortogram w/Lower Extremity;  Surgeon: Nada LibmanVance W Brabham, MD;  [facility]: [facility] INVASIVE CV LAB;  Service: Cardiovascular;  Laterality: N/A;  . right ovary removal       Allergies  Allergen Reactions  . Clarithromycin Other (See Comments)    Makes her nervous  . Levofloxacin Other (See Comments)   nervousness  . Red Dye Hives and Itching    Prior to Admission medications   Medication Sig Start Date End Date Taking? Authorizing Provider  acetaminophen (TYLENOL) 500 MG tablet Take 500 mg by mouth 2 (two) times daily.    Yes Historical Provider, MD  aspirin EC 81 MG tablet Take 81 mg by mouth daily.   Yes Historical Provider, MD  levocetirizine (XYZAL) 5 MG tablet Take 5 mg by mouth daily.   Yes Historical Provider, MD  montelukast (SINGULAIR) 10 MG tablet Take 10 mg by mouth at bedtime.   Yes Historical Provider, MD  pantoprazole (PROTONIX) 40 MG tablet Take 40 mg by mouth daily before breakfast.    Yes Historical Provider, MD  triamcinolone (NASACORT) 55 MCG/ACT AERO nasal inhaler Place 1 spray into both nostrils daily. Reported on 03/11/2016   Yes Historical Provider, MD  oxyCODONE-acetaminophen (PERCOCET/ROXICET) 5-325 MG tablet Take 1-2 tablets by mouth every 4 (four) hours as needed for moderate pain. Patient not taking: Reported on 08/15/2016 05/01/16   Lars MageEmma M Collins, PA-C    Social History   Social History  . Marital status: Married    Spouse name: N/A  . Number of children: N/A  . Years of education: N/A   Occupational History  . Not on file.   Social History Main Topics  . Smoking status: Current Every Day Smoker    Packs/day: 1.00    Years: 45.00    Types: Cigarettes  . Smokeless tobacco: Never Used     Comment: 3 per day  .  Alcohol use No  . Drug use: No  . Sexual activity: Yes    Partners: Male     Comment: pt. down to 3 cigarettes a day   Other Topics Concern  . Not on file   Social History Narrative  . No narrative on file     No family history on file.  ROS:  Constitutional:  Constitutional negative. Respiratory: Respiratory negative.  Cardiovascular: Cardiovascular negative.  GI: Gastrointestinal negative.       Previous abdominal pain with cholesterol medication Musculoskeletal:       LLE edema and ankle pain     Physical  Examination  Vitals:   08/20/16 0635  BP: 129/83  Pulse: 82  Resp: 18  Temp: 97.8 F (36.6 C)   Body mass index is 27.46 kg/m.  General:  WDWN in NAD Gait: Not observed HENT: WNL, normocephalic Pulmonary: normal non-labored breathing, without Rales, rhonchi,  wheezing Cardiac: rrr Abdomen:  soft, NT/ND, no masses Extremities: strong pt signal on left, edema improved from office visit Musculoskeletal: no muscle wasting or atrophy    CBC    Component Value Date/Time   WBC 13.9 (H) 04/30/2016 2350   RBC 3.85 (L) 04/30/2016 2350   HGB 14.6 08/20/2016 0713   HCT 43.0 08/20/2016 0713   PLT 284 04/30/2016 2350   MCV 89.1 04/30/2016 2350   MCH 29.1 04/30/2016 2350   MCHC 32.7 04/30/2016 2350   RDW 12.6 04/30/2016 2350   LYMPHSABS 2.3 11/26/2011 2242   MONOABS 0.9 11/26/2011 2242   EOSABS 0.0 11/26/2011 2242   BASOSABS 0.0 11/26/2011 2242    BMET    Component Value Date/Time   NA 141 08/20/2016 0713   K 4.3 08/20/2016 0713   CL 106 08/20/2016 0713   CO2 26 04/30/2016 2350   GLUCOSE 107 (H) 08/20/2016 0713   BUN 9 08/20/2016 0713   CREATININE 0.60 08/20/2016 0713   CREATININE 0.63 06/05/2011 1221   CALCIUM 9.0 04/30/2016 2350   GFRNONAA >60 04/30/2016 2350   GFRNONAA >60 06/05/2011 1221   GFRAA >60 04/30/2016 2350   GFRAA >60 06/05/2011 1221    COAGS: Lab Results  Component Value Date   INR 1.00 04/22/2016     ASSESSMENT/PLAN: This is a 65 y.o. female s/p lle sfa-bk pop bypass last May for rest pain. She remains on asa. Diminished velocity at distal vein graft with recent decrease in ABI are suspect. Aortogram with runoff LLE today. Discussed risks and benefits and she agrees to proceed.  Brandon C. Randie Heinzain, MD [facility] [place] Office: [phone] Pager: [phone]

## 2016-08-20 NOTE — Discharge Instructions (Signed)
Angiogram, Care After °Refer to this sheet in the next few weeks. These instructions provide you with information about caring for yourself after your procedure. Your health care provider may also give you more specific instructions. Your treatment has been planned according to current medical practices, but problems sometimes occur. Call your health care provider if you have any problems or questions after your procedure. °WHAT TO EXPECT AFTER THE PROCEDURE °After your procedure, it is typical to have the following: °· Bruising at the catheter insertion site that usually fades within 1-2 weeks. °· Blood collecting in the tissue (hematoma) that may be painful to the touch. It should usually decrease in size and tenderness within 1-2 weeks. °HOME CARE INSTRUCTIONS °· Take medicines only as directed by your health care provider. °· You may shower 24-48 hours after the procedure or as directed by your health care provider. Remove the bandage (dressing) and gently wash the site with plain soap and water. Pat the area dry with a clean towel. Do not rub the site, because this may cause bleeding. °· Do not take baths, swim, or use a hot tub until your health care provider approves. °· Check your insertion site every day for redness, swelling, or drainage. °· Do not apply powder or lotion to the site. °· Do not lift over 10 lb (4.5 kg) for 5 days after your procedure or as directed by your health care provider. °· Ask your health care provider when it is okay to: °¨ Return to work or school. °¨ Resume usual physical activities or sports. °¨ Resume sexual activity. °· Do not drive home if you are discharged the same day as the procedure. Have someone else drive you. °· You may drive 24 hours after the procedure unless otherwise instructed by your health care provider. °· Do not operate machinery or power tools for 24 hours after the procedure or as directed by your health care provider. °· If your procedure was done as an  outpatient procedure, which means that you went home the same day as your procedure, a responsible adult should be with you for the first 24 hours after you arrive home. °· Keep all follow-up visits as directed by your health care provider. This is important. °SEEK MEDICAL CARE IF: °· You have a fever. °· You have chills. °· You have increased bleeding from the catheter insertion site. Hold pressure on the site.  CALL 911 °SEEK IMMEDIATE MEDICAL CARE IF: °· You have unusual pain at the catheter insertion site. °· You have redness, warmth, or swelling at the catheter insertion site. °· You have drainage (other than a small amount of blood on the dressing) from the catheter insertion site. °· The catheter insertion site is bleeding, and the bleeding does not stop after 30 minutes of holding steady pressure on the site. °· The area near or just beyond the catheter insertion site becomes pale, cool, tingly, or numb. °  °This information is not intended to replace advice given to you by your health care provider. Make sure you discuss any questions you have with your health care provider. °  °Document Released: 07/03/2005 Document Revised: 01/05/2015 Document Reviewed: 05/18/2013 °Elsevier Interactive Patient Education ©2016 [employer] ° °

## 2016-08-20 NOTE — Op Note (Signed)
    Patient name: [name] MRN: [id number] DOB: January 20, 1951 Sex: female  08/20/2016  Pre-operative Diagnosis: Peripheral arterial disease  Post-operative diagnosis:  Same Surgeon:  Lemar LivingsBrandon Destony Prevost, MD Procedure Performed:  1.  Ultrasound guided cannulation right common femoral artery  2.  Aortogram with left lower extremity runoff  3.  Drug coated balloon angioplasty left distal SFA    Indications:  65 year old female with history of left SFA to keep trunk bypass rest pain that was done with non-reversed saphenous vein 3 months prior to this procedure. In follow-up she had increase in her ABI as well as diminished velocities in the distal aspect of her vein graft. She was therefore indicated for the above operation risks and benefits were described to her and she agreed to proceed.  Procedure:  The patient was identified in the holding area and taken to room 8.  The patient was then placed supine on the table and prepped and draped in the usual sterile fashion.  A time out was called.  Ultrasound was used to evaluate the right common femoral artery.  It was patent .  A digital ultrasound image was acquired.  A micropuncture needle was used to access the right common femoral artery under ultrasound guidance.  An 018 wire was advanced without resistance and a micropuncture sheath was placed.  The 018 wire was removed and a bentson wire was placed.  The micropuncture sheath was exchanged for a 5 french sheath.  An omniflush catheter was advanced over the wire to the level of L-1.  An abdominal angiogram was obtained.  Next, using the omniflush catheter and a benson wire, the aortic bifurcation was crossed and the catheter was placed into the left external iliac artery and left runoff was obtained. The decision was made to intervene on the distal left SFA lesion. The patient was heparinized initially with 6000 units of heparin a later giving another 2000 units of heparin after ACT returned 203. Please  cath to cross the lesion demonstrated this with hand injection angiogram. The lesion was predilated with a 4 mm balloon followed by drug coated balloon angioplasty with a 5 mm balloon. Completion angiogram demonstrated brisk runoff to the level of the bypass. Distally at the level of the bypass there was concern about the lesion however there was brisk runoff via the peroneal and she is currently asymptomatic I felt a also removed our wire inadvertently traversed her native popliteal to her native 80 artery which could be future for revascularization should completion angiogram regular minor dissection that was non-flow-limiting at the level of the balloon angioplasty brisk runoff via both the peroneal and she to the level of reconstitution of a below-the-knee we then removed she the level of the right external iliac artery and the procedure was completed.  Findings:   Aortogram:  Small aorta, patent  Left Lower Extremity:  Lesion at distal SFA proximal to takeoff of bypass with resolution post angioplasty nonflow limiting dissection present. Runoff to the foot via PT and peroneal arteries with possible stenosis at takeoff PT not treated AT reconstitutes and with inadvertently selected from via the native system although not revascularized this having.     [name] C. Randie Heinzain, MD [facility] Office: [phone] Pager: [phone]

## 2016-09-26 ENCOUNTER — Telehealth: Payer: Self-pay | Admitting: Vascular Surgery

## 2016-09-26 NOTE — Telephone Encounter (Signed)
-----   Message from Sharee PimpleMarilyn K McChesney, RN sent at 08/20/2016 10:51 AM EDT ----- Regarding: schedule   ----- Message ----- From: Maeola HarmanBrandon Christopher Cain, MD Sent: 08/20/2016   9:51 AM To: [facility] Charge Pool  Sorry I forgot on last message. Ms. Revonda Humphreyuckett can f/u in 3 mos with ABI

## 2016-09-26 NOTE — Telephone Encounter (Signed)
spoke to pt on home # also mailing letter. 09/26/16 beg  appt is 12/1 US then F/u

## 2016-10-03 ENCOUNTER — Ambulatory Visit: Payer: PPO | Admitting: Vascular Surgery

## 2016-10-03 ENCOUNTER — Other Ambulatory Visit (HOSPITAL_COMMUNITY): Payer: PPO

## 2016-10-03 ENCOUNTER — Encounter (HOSPITAL_COMMUNITY): Payer: PPO

## 2016-10-06 DIAGNOSIS — J069 Acute upper respiratory infection, unspecified: Secondary | ICD-10-CM | POA: Diagnosis not present

## 2016-10-06 DIAGNOSIS — J0181 Other acute recurrent sinusitis: Secondary | ICD-10-CM | POA: Diagnosis not present

## 2016-10-06 DIAGNOSIS — K219 Gastro-esophageal reflux disease without esophagitis: Secondary | ICD-10-CM | POA: Diagnosis not present

## 2016-10-06 DIAGNOSIS — J449 Chronic obstructive pulmonary disease, unspecified: Secondary | ICD-10-CM | POA: Diagnosis not present

## 2016-10-06 DIAGNOSIS — Z6828 Body mass index (BMI) 28.0-28.9, adult: Secondary | ICD-10-CM | POA: Diagnosis not present

## 2016-11-28 ENCOUNTER — Ambulatory Visit: Payer: PPO | Admitting: Vascular Surgery

## 2016-11-28 ENCOUNTER — Ambulatory Visit (HOSPITAL_COMMUNITY): Payer: PPO

## 2016-12-18 ENCOUNTER — Encounter: Payer: Self-pay | Admitting: Vascular Surgery

## 2016-12-26 ENCOUNTER — Other Ambulatory Visit: Payer: Self-pay

## 2016-12-26 ENCOUNTER — Encounter: Payer: Self-pay | Admitting: Vascular Surgery

## 2016-12-26 ENCOUNTER — Ambulatory Visit (INDEPENDENT_AMBULATORY_CARE_PROVIDER_SITE_OTHER): Payer: PPO | Admitting: Vascular Surgery

## 2016-12-26 ENCOUNTER — Ambulatory Visit (HOSPITAL_COMMUNITY)
Admission: RE | Admit: 2016-12-26 | Discharge: 2016-12-26 | Disposition: A | Discharge status: Home / Self Care | Payer: PPO | Source: Ambulatory Visit | Attending: Vascular Surgery | Admitting: Vascular Surgery

## 2016-12-26 VITALS — BP 140/86 | HR 74 | Temp 97.6°F | Resp 16 | Ht 62.0 in | Wt 145.0 lb

## 2016-12-26 DIAGNOSIS — I739 Peripheral vascular disease, unspecified: Secondary | ICD-10-CM

## 2016-12-26 DIAGNOSIS — F17200 Nicotine dependence, unspecified, uncomplicated: Secondary | ICD-10-CM | POA: Insufficient documentation

## 2016-12-26 DIAGNOSIS — Z9862 Peripheral vascular angioplasty status: Secondary | ICD-10-CM | POA: Diagnosis not present

## 2016-12-26 NOTE — Progress Notes (Signed)
Patient ID: Marlene Bast, female   DOB: Jun 18, 1951, 65 y.o.   MRN: [id number]  Reason for Consult: PVD (3 month f/u)   Referred by Elfredia Nevins, MD  Subjective:     HPI:  AVAREY YAEGER is a 65 y.o. female former patient of Dr. Hart Rochester who is undergone a left-sided SFA to tibioperoneal trunk bypass in May 2017 for severe claudication. At my first evaluation of her in August she had a drop in her ABI and had diminished velocities in her bypass graft. She therefore underwent a balloon angioplasty of her distal SFA proximal to the graft and today is doing very well. 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 She took Plavix for 3 months following her intervention is now taking aspirin daily. She does have some tiredness in her leg but denies frank claudication at this point. She had one episode of severe pain in her left thigh that resolved after 15 minutes. She does not have tissue loss or ulceration. No issues regarding today's visit. She previously had significant edema of her left lower leg but that is now resolving. She continues to have burning in her feet that is stable from preoperative.  Past Medical History:  Diagnosis Date  . GERD (gastroesophageal reflux disease)   . Hyperlipidemia   . Multiple allergies   . Peripheral vascular disease (HCC)    see 12/03/15- US arterial- shows borderline PAD   . Pneumonia    hx of 2016    No family history on file. Past Surgical History:  Procedure Laterality Date  . ABDOMINAL HYSTERECTOMY    . CHOLECYSTECTOMY    . ENDARTERECTOMY FEMORAL Left 04/30/2016   Procedure: Endarterectomy of Left popliteal, anterior tibial and tibio-peroneal trunk;  Surgeon: Pryor Ochoa, MD;  Location: [facility] OR;  Service: Vascular;  Laterality: Left;  . FEMORAL-POPLITEAL BYPASS GRAFT Left 04/30/2016   Procedure: Left Distal Superfical Femoral to Tibio-peroneal trunk bypass using non-reversed saphenous vein graph Left  Leg;  Surgeon: Pryor Ochoa, MD;  Location: [facility] OR;  Service: Vascular;  Laterality: Left;  . INTRAOPERATIVE ARTERIOGRAM Left 04/30/2016   Procedure: Left Leg INTRA OPERATIVE ARTERIOGRAM;  Surgeon: Pryor Ochoa, MD;  Location: [facility] OR;  Service: Vascular;  Laterality: Left;  . LUMBAR LAMINECTOMY/DECOMPRESSION MICRODISCECTOMY Left 12/26/2015   Procedure: LUMBAR LAMINECTOMY/DECOMPRESSION MICRODISCECTOMY 1 LEVEL L4-5 ON LEFT ;  Surgeon: Jene Every, MD;  Location: [facility] ORS;  Service: Orthopedics;  Laterality: Left;  . PERIPHERAL VASCULAR CATHETERIZATION N/A 03/25/2016   Procedure: Abdominal Aortogram w/Lower Extremity;  Surgeon: Nada Libman, MD;  Location: [facility] INVASIVE CV LAB;  Service: Cardiovascular;  Laterality: N/A;  . PERIPHERAL VASCULAR CATHETERIZATION N/A 08/20/2016   Procedure: Abdominal Aortogram w/Lower Extremity;  Surgeon: Maeola Harman, MD;  Location: [facility] INVASIVE CV LAB;  Service: Cardiovascular;  Laterality: N/A;  . PERIPHERAL VASCULAR CATHETERIZATION Left 08/20/2016   Procedure: Peripheral Vascular Balloon Angioplasty;  Surgeon: Maeola Harman, MD;  Location: [facility] INVASIVE CV LAB;  Service: Cardiovascular;  Laterality: Left;  Superficial femoral  . right ovary removal       Short Social History:  Social History  Substance Use Topics  . Smoking status: Current Every Day Smoker    Packs/day: 1.00    Years: 45.00    Types: Cigarettes  . Smokeless tobacco: Never Used     Comment: 3 per day  . Alcohol use No    Allergies  Allergen Reactions  .  Clarithromycin Other (See Comments)    Makes her nervous  . Levofloxacin Other (See Comments)    nervousness  . Red Dye Hives and Itching    Current Outpatient Prescriptions  Medication Sig Dispense Refill  . acetaminophen (TYLENOL) 500 MG tablet Take 500 mg by mouth 2 (two) times daily.     Marland Kitchen. acetaminophen-codeine (TYLENOL #3) 300-30 MG tablet     . aspirin EC 81 MG tablet Take 81 mg by mouth daily.    Marland Kitchen.  levocetirizine (XYZAL) 5 MG tablet Take 5 mg by mouth daily.    . montelukast (SINGULAIR) 10 MG tablet Take 10 mg by mouth at bedtime.    . pantoprazole (PROTONIX) 40 MG tablet Take 40 mg by mouth daily before breakfast.     . penicillin v potassium (VEETID) 500 MG tablet     . triamcinolone (NASACORT) 55 MCG/ACT AERO nasal inhaler Place 1 spray into both nostrils daily. Reported on 03/11/2016    . amoxicillin (AMOXIL) 500 MG capsule     . clopidogrel (PLAVIX) 75 MG tablet Take 1 tablet (75 mg total) by mouth daily. (Patient not taking: Reported on 12/26/2016) 30 tablet 2  . doxycycline (VIBRAMYCIN) 100 MG capsule     . HYDROcodone-acetaminophen (NORCO) 10-325 MG tablet     . oxyCODONE-acetaminophen (PERCOCET/ROXICET) 5-325 MG tablet Take 1-2 tablets by mouth every 4 (four) hours as needed for moderate pain. (Patient not taking: Reported on 12/26/2016) 15 tablet 0   No current facility-administered medications for this visit.     Review of Systems  Constitutional:  Constitutional negative. HENT: HENT negative.  Eyes: Eyes negative.  Respiratory: Respiratory negative.  Cardiovascular: Cardiovascular negative.  GI: Gastrointestinal negative.  Musculoskeletal: Musculoskeletal negative.  Skin: Skin negative.  Neurological:       Burning in left foot, particularly heel Hematologic: Hematologic/lymphatic negative.  Psychiatric: Psychiatric negative.        Objective:  Objective   Vitals:   12/26/16 1006  BP: 140/86  Pulse: 74  Resp: 16  Temp: 97.6 F (36.4 C)  SpO2: 97%  Weight: 145 lb (65.8 kg)  Height: 5\' 2"  (1.575 m)   Body mass index is 26.52 kg/m.  Physical Exam  Constitutional: She is oriented to person, place, and time. She appears well-developed.  HENT:  Head: Normocephalic.  Eyes: EOM are normal.  Neck: Neck supple.  Cardiovascular: Normal rate.   Strong left PT signal, monophasic at peroneal and AT  Pulmonary/Chest: Effort normal.  Abdominal: Soft. She  exhibits no mass.  Musculoskeletal: Normal range of motion. She exhibits no edema or deformity.  Neurological: She is alert and oriented to person, place, and time.  Skin: Skin is warm and dry.  Psychiatric: She has a normal mood and affect. Her behavior is normal. Judgment and thought content normal.    Data: ABIs are 1 bilaterally with triphasic waveforms.     Assessment/Plan:     65 year old white female status post left lower showed a bypass that had diminished velocities throughout the graft concerning for impending failure. She underwent drug-coated balloon antroplasty to her left SFA now has ABI of 1 improved from 0.8. She completed 3 month course of Plavix and now takes aspirin as well as statin drug. She does continue to smoke and I have urged her to stop us from the uterus resolution. I will see her back in 3 months with left lower extremity graft duplex as well as ABI. She has no activity restrictions I expect that her pain  in her left lower extremity as well as swelling will continue to improve over time.     Maeola HarmanBrandon Christopher Kayonna Lawniczak MD [facility]

## 2017-02-19 DIAGNOSIS — G8929 Other chronic pain: Secondary | ICD-10-CM | POA: Diagnosis not present

## 2017-02-19 DIAGNOSIS — M5442 Lumbago with sciatica, left side: Secondary | ICD-10-CM | POA: Diagnosis not present

## 2017-02-20 ENCOUNTER — Telehealth: Payer: Self-pay | Admitting: Vascular Surgery

## 2017-02-20 NOTE — Telephone Encounter (Signed)
Patient's appointment has been moved from 4/6 to 4/20 to make room for emergent patient. Attempted to contact patients x3 and also mailed letters noting canceled appointment and new appointment date.

## 2017-03-10 DIAGNOSIS — M961 Postlaminectomy syndrome, not elsewhere classified: Secondary | ICD-10-CM | POA: Diagnosis not present

## 2017-03-10 DIAGNOSIS — M47816 Spondylosis without myelopathy or radiculopathy, lumbar region: Secondary | ICD-10-CM | POA: Diagnosis not present

## 2017-03-11 IMAGING — CR DG ANG/EXT/UNI/OR LEFT
1 series · 1 of 1 positions shown · non-contrast
Comparison: None.

CLINICAL DATA: Left distal SFA to tibioperoneal trunk bypass
grafting.

EXAM:
YENER TRIP/EXT/UNI/ OR

[AP]
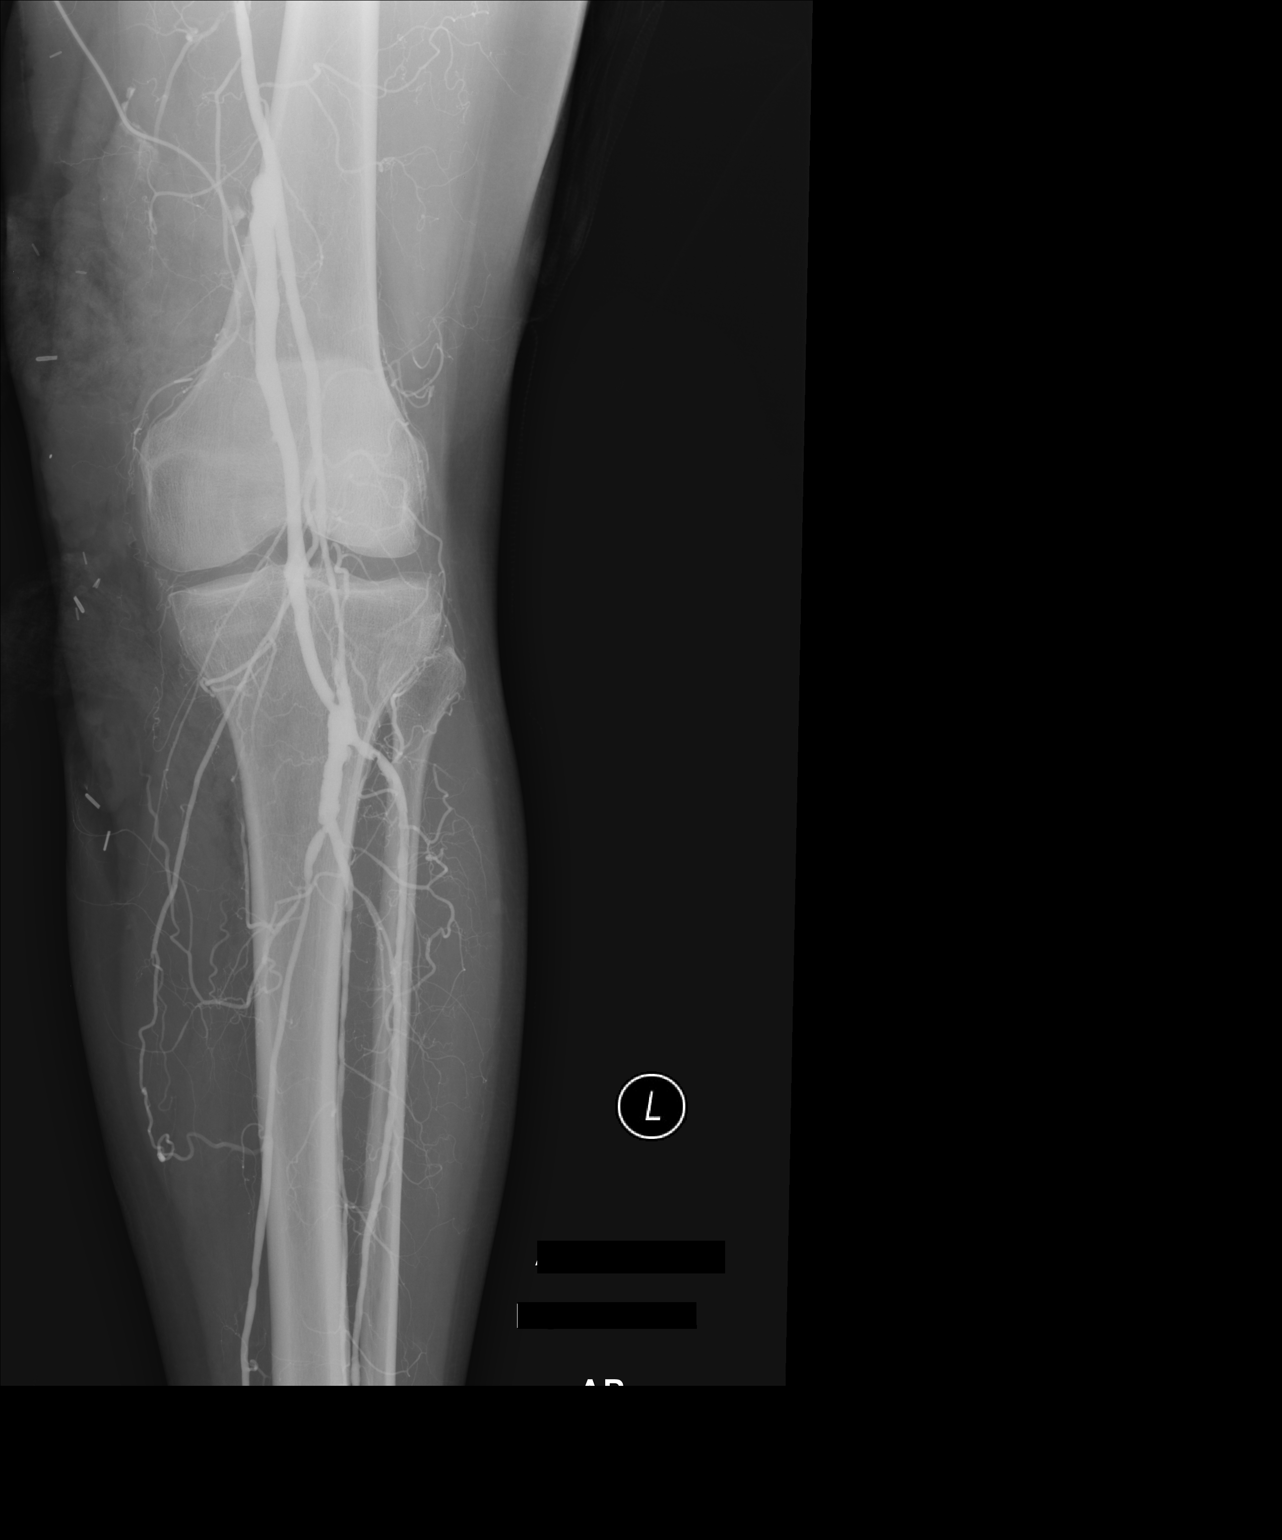

[1 of 1 positions shown; findings below may reference images not displayed]

FINDINGS: Intraoperative image shows a patent bypass graft extending from the
level of the distal SFA at the adductor hiatus across the knee and
to the level of the tibioperoneal trunk/distal popliteal artery.
Visualized anterior tibial, posterior tibial and peroneal arteries
are open with mild disease.
IMPRESSION: Patent bypass graft across the knee extending from the distal left
SFA to the tibioperoneal trunk/distal popliteal artery.

## 2017-04-03 ENCOUNTER — Ambulatory Visit: Payer: PPO | Admitting: Vascular Surgery

## 2017-04-03 ENCOUNTER — Encounter (HOSPITAL_COMMUNITY): Payer: PPO

## 2017-04-03 ENCOUNTER — Other Ambulatory Visit (HOSPITAL_COMMUNITY): Payer: PPO

## 2017-04-06 ENCOUNTER — Encounter: Payer: Self-pay | Admitting: Vascular Surgery

## 2017-04-13 DIAGNOSIS — Z1389 Encounter for screening for other disorder: Secondary | ICD-10-CM | POA: Diagnosis not present

## 2017-04-13 DIAGNOSIS — E663 Overweight: Secondary | ICD-10-CM | POA: Diagnosis not present

## 2017-04-13 DIAGNOSIS — Z6826 Body mass index (BMI) 26.0-26.9, adult: Secondary | ICD-10-CM | POA: Diagnosis not present

## 2017-04-13 DIAGNOSIS — M545 Low back pain: Secondary | ICD-10-CM | POA: Diagnosis not present

## 2017-04-13 DIAGNOSIS — N342 Other urethritis: Secondary | ICD-10-CM | POA: Diagnosis not present

## 2017-04-17 ENCOUNTER — Ambulatory Visit (INDEPENDENT_AMBULATORY_CARE_PROVIDER_SITE_OTHER): Payer: PPO | Admitting: Vascular Surgery

## 2017-04-17 ENCOUNTER — Ambulatory Visit (HOSPITAL_COMMUNITY)
Admission: RE | Admit: 2017-04-17 | Discharge: 2017-04-17 | Disposition: A | Discharge status: Home / Self Care | Payer: PPO | Source: Ambulatory Visit | Attending: Vascular Surgery | Admitting: Vascular Surgery

## 2017-04-17 ENCOUNTER — Encounter: Payer: Self-pay | Admitting: Vascular Surgery

## 2017-04-17 VITALS — BP 114/76 | HR 76 | Temp 98.1°F | Resp 16 | Ht 62.0 in | Wt 144.0 lb

## 2017-04-17 DIAGNOSIS — I739 Peripheral vascular disease, unspecified: Secondary | ICD-10-CM

## 2017-04-17 DIAGNOSIS — F17200 Nicotine dependence, unspecified, uncomplicated: Secondary | ICD-10-CM | POA: Insufficient documentation

## 2017-04-17 DIAGNOSIS — Z9862 Peripheral vascular angioplasty status: Secondary | ICD-10-CM | POA: Diagnosis not present

## 2017-04-17 NOTE — Progress Notes (Signed)
Patient ID: [name], female   DOB: [birth year], 66 y.o.   MRN: [id number]  Reason for Consult: PAD (f/u)   Referred by Erin Nevins, MD  Subjective:     HPI:  [name] is a 66 y.o. female follows up for routine evaluation 3 months with left lower extremity duplex and ABIs. She has persistent numbness in her left foot that is stable since her bypass surgery. She continues to walk limited only by her back is not having any left lower extremity claudication, rest pain or tissue loss or ulceration. She continues on aspirin and Plavix continues to work part-time. She also continues to smoke which I have counseled her on today..   Past Medical History:  Diagnosis Date  . GERD (gastroesophageal reflux disease)   . Hyperlipidemia   . Multiple allergies   . Peripheral vascular disease (HCC)    see 12/03/15- US arterial- shows borderline PAD   . Pneumonia    hx of 2016    No family history on file. Past Surgical History:  Procedure Laterality Date  . ABDOMINAL HYSTERECTOMY    . CHOLECYSTECTOMY    . ENDARTERECTOMY FEMORAL Left 04/30/2016   Procedure: Endarterectomy of Left popliteal, anterior tibial and tibio-peroneal trunk;  Surgeon: Erin Ochoa, MD;  Location: [facility] OR;  Service: Vascular;  Laterality: Left;  . FEMORAL-POPLITEAL BYPASS GRAFT Left 04/30/2016   Procedure: Left Distal Superfical Femoral to Tibio-peroneal trunk bypass using non-reversed saphenous vein graph Left Leg;  Surgeon: Erin Ochoa, MD;  Location: [facility] OR;  Service: Vascular;  Laterality: Left;  . INTRAOPERATIVE ARTERIOGRAM Left 04/30/2016   Procedure: Left Leg INTRA OPERATIVE ARTERIOGRAM;  Surgeon: Erin Ochoa, MD;  Location: [facility] OR;  Service: Vascular;  Laterality: Left;  . LUMBAR LAMINECTOMY/DECOMPRESSION MICRODISCECTOMY Left 12/26/2015   Procedure: LUMBAR LAMINECTOMY/DECOMPRESSION MICRODISCECTOMY 1 LEVEL L4-5 ON LEFT ;  Surgeon: Erin Every, MD;  Location: [facility] ORS;  Service: Orthopedics;  Laterality:  Left;  . PERIPHERAL VASCULAR CATHETERIZATION N/A 03/25/2016   Procedure: Abdominal Aortogram w/Lower Extremity;  Surgeon: Erin Libman, MD;  Location: [facility] INVASIVE CV LAB;  Service: Cardiovascular;  Laterality: N/A;  . PERIPHERAL VASCULAR CATHETERIZATION N/A 08/20/2016   Procedure: Abdominal Aortogram w/Lower Extremity;  Surgeon: Erin Harman, MD;  Location: [facility] INVASIVE CV LAB;  Service: Cardiovascular;  Laterality: N/A;  . PERIPHERAL VASCULAR CATHETERIZATION Left 08/20/2016   Procedure: Peripheral Vascular Balloon Angioplasty;  Surgeon: Erin Harman, MD;  Location: [facility] INVASIVE CV LAB;  Service: Cardiovascular;  Laterality: Left;  Superficial femoral  . right ovary removal       Short Social History:  Social History  Substance Use Topics  . Smoking status: Current Patel Day Smoker    Packs/day: 1.00    Years: 45.00    Types: Cigarettes  . Smokeless tobacco: Never Used     Comment: 3 per day  . Alcohol use No    Allergies  Allergen Reactions  . Clarithromycin Other (See Comments)    Makes her nervous  . Levofloxacin Other (See Comments)    nervousness  . Red Dye Hives and Itching    Current Outpatient Prescriptions  Medication Sig Dispense Refill  . acetaminophen (TYLENOL) 500 MG tablet Take 500 mg by mouth 2 (two) times daily.     Marland Kitchen acetaminophen-codeine (TYLENOL #3) 300-30 MG tablet     . aspirin EC 81 MG tablet Take 81 mg by mouth daily.    . clopidogrel (PLAVIX) 75 MG tablet Take 1  tablet (75 mg total) by mouth daily. 30 tablet 2  . levocetirizine (XYZAL) 5 MG tablet Take 5 mg by mouth daily.    . montelukast (SINGULAIR) 10 MG tablet Take 10 mg by mouth at bedtime.    . pantoprazole (PROTONIX) 40 MG tablet Take 40 mg by mouth daily before breakfast.     . triamcinolone (NASACORT) 55 MCG/ACT AERO nasal inhaler Place 1 spray into both nostrils daily. Reported on 03/11/2016    . amoxicillin (AMOXIL) 500 MG capsule     . doxycycline (VIBRAMYCIN) 100 MG  capsule     . HYDROcodone-acetaminophen (NORCO) 10-325 MG tablet     . oxyCODONE-acetaminophen (PERCOCET/ROXICET) 5-325 MG tablet Take 1-2 tablets by mouth Patel 4 (four) hours as needed for moderate pain. (Patient not taking: Reported on 04/17/2017) 15 tablet 0  . penicillin v potassium (VEETID) 500 MG tablet      No current facility-administered medications for this visit.     Review of Systems  Constitutional:  Constitutional negative. Eyes: Eyes negative.  Respiratory: Respiratory negative.  Cardiovascular: Positive for leg swelling.  GI: Gastrointestinal negative.  Musculoskeletal: Positive for back pain.  Neurological: Positive for numbness.  Hematologic: Hematologic/lymphatic negative.  Psychiatric: Psychiatric negative.        Objective:  Objective   Vitals:   04/17/17 1127  BP: 114/76  Pulse: 76  Resp: 16  Temp: 98.1 F (36.7 C)  SpO2: 97%  Weight: 144 lb (65.3 kg)  Height:  (1.575 m)   Body mass index is 26.34 kg/m.  Physical Exam  Constitutional: She is oriented to person, place, and time. She appears well-developed.  HENT:  Head: Normocephalic.  Cardiovascular: Normal rate.   Multiphasic left dp/pt  Pulmonary/Chest: Effort normal.  Abdominal: Soft.  Musculoskeletal: Normal range of motion. She exhibits no edema.  Well healed incisions left medial leg  Neurological: She is alert and oriented to person, place, and time.  Skin: Skin is warm and dry.  Psychiatric: She has a normal mood and affect. Her behavior is normal. Judgment and thought content normal.    Data: I had apparently interpreted her ABIs today to be 1.01 and 1.09.  Duplex left lower extremity demonstrates no evidence of stenosis does have blood flow in the mid graft at 30 cm/s.      A/P: 66 year old female returns for evaluation of her left lower extremity where she has a previous bypass and I performed drug-coated balloon angioplasty of her distal SFA. Her ABIs have improved  since our procedure in August and remained stable at 1 and her only symptom is numbness of her left foot which is also stable since surgery. Partially she has diminished velocities down to 30 and her bypass graft which suggested me that she has recurrent disease either within the graft or more likely proximal to it were we balloon in the past. Also unfortunately she continues to smoke and I told her that any intervention is sure to fail if she is smoking and she is now amenable to quitting. F/u in 3 months with repeat studies and if she has quit we can plan repeat angiogram with likely intervention to keep her graft patent.      Erin Harman MD [facility]

## 2017-04-20 NOTE — Addendum Note (Signed)
Addended by: Burton Apley A on: 04/20/2017 09:29 AM   Modules accepted: Orders

## 2017-06-01 DIAGNOSIS — M961 Postlaminectomy syndrome, not elsewhere classified: Secondary | ICD-10-CM | POA: Diagnosis not present

## 2017-06-01 DIAGNOSIS — M47816 Spondylosis without myelopathy or radiculopathy, lumbar region: Secondary | ICD-10-CM | POA: Diagnosis not present

## 2017-06-01 DIAGNOSIS — Z9889 Other specified postprocedural states: Secondary | ICD-10-CM | POA: Diagnosis not present

## 2017-06-19 DIAGNOSIS — H5201 Hypermetropia, right eye: Secondary | ICD-10-CM | POA: Diagnosis not present

## 2017-06-19 DIAGNOSIS — H25013 Cortical age-related cataract, bilateral: Secondary | ICD-10-CM | POA: Diagnosis not present

## 2017-06-19 DIAGNOSIS — H5212 Myopia, left eye: Secondary | ICD-10-CM | POA: Diagnosis not present

## 2017-06-19 DIAGNOSIS — H2513 Age-related nuclear cataract, bilateral: Secondary | ICD-10-CM | POA: Diagnosis not present

## 2017-06-19 DIAGNOSIS — H35373 Puckering of macula, bilateral: Secondary | ICD-10-CM | POA: Diagnosis not present

## 2017-06-19 DIAGNOSIS — H524 Presbyopia: Secondary | ICD-10-CM | POA: Diagnosis not present

## 2017-06-19 DIAGNOSIS — H52223 Regular astigmatism, bilateral: Secondary | ICD-10-CM | POA: Diagnosis not present

## 2017-06-19 DIAGNOSIS — H25813 Combined forms of age-related cataract, bilateral: Secondary | ICD-10-CM | POA: Diagnosis not present

## 2017-06-24 DIAGNOSIS — M47816 Spondylosis without myelopathy or radiculopathy, lumbar region: Secondary | ICD-10-CM | POA: Diagnosis not present

## 2017-07-17 ENCOUNTER — Encounter: Payer: Self-pay | Admitting: Vascular Surgery

## 2017-07-21 DIAGNOSIS — E663 Overweight: Secondary | ICD-10-CM | POA: Diagnosis not present

## 2017-07-21 DIAGNOSIS — R51 Headache: Secondary | ICD-10-CM | POA: Diagnosis not present

## 2017-07-21 DIAGNOSIS — R0981 Nasal congestion: Secondary | ICD-10-CM | POA: Diagnosis not present

## 2017-07-21 DIAGNOSIS — Z1211 Encounter for screening for malignant neoplasm of colon: Secondary | ICD-10-CM | POA: Diagnosis not present

## 2017-07-21 DIAGNOSIS — Z6827 Body mass index (BMI) 27.0-27.9, adult: Secondary | ICD-10-CM | POA: Diagnosis not present

## 2017-07-22 ENCOUNTER — Other Ambulatory Visit: Payer: Self-pay | Admitting: Vascular Surgery

## 2017-07-22 DIAGNOSIS — I739 Peripheral vascular disease, unspecified: Secondary | ICD-10-CM

## 2017-07-24 ENCOUNTER — Ambulatory Visit (HOSPITAL_COMMUNITY)
Admission: RE | Admit: 2017-07-24 | Discharge: 2017-07-24 | Disposition: A | Discharge status: Home / Self Care | Payer: PPO | Source: Ambulatory Visit | Attending: Vascular Surgery | Admitting: Vascular Surgery

## 2017-07-24 ENCOUNTER — Ambulatory Visit (INDEPENDENT_AMBULATORY_CARE_PROVIDER_SITE_OTHER)
Admission: RE | Admit: 2017-07-24 | Discharge: 2017-07-24 | Disposition: A | Discharge status: Home / Self Care | Payer: PPO | Source: Ambulatory Visit | Attending: Vascular Surgery | Admitting: Vascular Surgery

## 2017-07-24 ENCOUNTER — Ambulatory Visit (INDEPENDENT_AMBULATORY_CARE_PROVIDER_SITE_OTHER): Payer: PPO | Admitting: Vascular Surgery

## 2017-07-24 ENCOUNTER — Encounter: Payer: Self-pay | Admitting: Vascular Surgery

## 2017-07-24 VITALS — BP 122/85 | HR 77 | Temp 97.9°F | Resp 18 | Ht 62.0 in | Wt 146.4 lb

## 2017-07-24 DIAGNOSIS — Z95828 Presence of other vascular implants and grafts: Secondary | ICD-10-CM | POA: Diagnosis not present

## 2017-07-24 DIAGNOSIS — F17210 Nicotine dependence, cigarettes, uncomplicated: Secondary | ICD-10-CM | POA: Diagnosis not present

## 2017-07-24 DIAGNOSIS — E785 Hyperlipidemia, unspecified: Secondary | ICD-10-CM | POA: Insufficient documentation

## 2017-07-24 DIAGNOSIS — I739 Peripheral vascular disease, unspecified: Secondary | ICD-10-CM

## 2017-07-24 DIAGNOSIS — Z9862 Peripheral vascular angioplasty status: Secondary | ICD-10-CM | POA: Insufficient documentation

## 2017-07-24 MED ORDER — SIMVASTATIN 5 MG PO TABS: 20.0000 mg | ORAL_TABLET | Freq: Every day | ORAL | Status: AC

## 2017-07-24 NOTE — Progress Notes (Signed)
Patient ID: [name], female   DOB: 12-19-1951, 66 y.o.   MRN: [id number]  Reason for Consult: Re-evaluation (3 month )   Referred by Elfredia NevinsFusco, Lawrence, MD  Subjective:     HPI:  Erin BastClara A Cadotte is a 66 y.o. female present previous left lower extremity bypass by Dr. Hart RochesterLawson. I then performed balloon angioplasty of her SFA proximal of the bypass in attempt to the bypass open. At last follow-up she had decreased velocities and I was concern for impending graft failure. At that time I deferred any intervention given she was still smoking. Fortunately at this time she has quit smoking. She continues to stay very active. She is off of Plavix now takes aspirin only. She does inquire about statin drug today. 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 She is not having any issues related to today's visit.  Past Medical History:  Diagnosis Date  . GERD (gastroesophageal reflux disease)   . Hyperlipidemia   . Multiple allergies   . Peripheral vascular disease (HCC)    see 12/03/15- US arterial- shows borderline PAD   . Pneumonia    hx of 2016    History reviewed. No pertinent family history. Past Surgical History:  Procedure Laterality Date  . ABDOMINAL HYSTERECTOMY    . CHOLECYSTECTOMY    . ENDARTERECTOMY FEMORAL Left 04/30/2016   Procedure: Endarterectomy of Left popliteal, anterior tibial and tibio-peroneal trunk;  Surgeon: Pryor OchoaJames D Lawson, MD;  [facility];  Service: Vascular;  Laterality: Left;  . FEMORAL-POPLITEAL BYPASS GRAFT Left 04/30/2016   Procedure: Left Distal Superfical Femoral to Tibio-peroneal trunk bypass using non-reversed saphenous vein graph Left Leg;  Surgeon: Pryor OchoaJames D Lawson, MD;  [facility];  Service: Vascular;  Laterality: Left;  . INTRAOPERATIVE ARTERIOGRAM Left 04/30/2016   Procedure: Left Leg INTRA OPERATIVE ARTERIOGRAM;  Surgeon: Pryor OchoaJames D Lawson, MD;  [facility];  Service: Vascular;  Laterality: Left;  . LUMBAR  LAMINECTOMY/DECOMPRESSION MICRODISCECTOMY Left 12/26/2015   Procedure: LUMBAR LAMINECTOMY/DECOMPRESSION MICRODISCECTOMY 1 LEVEL L4-5 ON LEFT ;  Surgeon: Jene EveryJeffrey Beane, MD;  Location: [facility] ORS;  Service: Orthopedics;  Laterality: Left;  . PERIPHERAL VASCULAR CATHETERIZATION N/A 03/25/2016   Procedure: Abdominal Aortogram w/Lower Extremity;  Surgeon: Nada LibmanVance W Brabham, MD;  Location: [facility] INVASIVE CV LAB;  Service: Cardiovascular;  Laterality: N/A;  . PERIPHERAL VASCULAR CATHETERIZATION N/A 08/20/2016   Procedure: Abdominal Aortogram w/Lower Extremity;  Surgeon: Maeola HarmanBrandon Christopher Cain, MD;  [facility];  Service: Cardiovascular;  Laterality: N/A;  . PERIPHERAL VASCULAR CATHETERIZATION Left 08/20/2016   Procedure: Peripheral Vascular Balloon Angioplasty;  Surgeon: Maeola HarmanBrandon Christopher Cain, MD;  [facility];  Service: Cardiovascular;  Laterality: Left;  Superficial femoral  . right ovary removal       Short Social History:  Social History  Substance Use Topics  . Smoking status: Current Every Day Smoker    Packs/day: 1.00    Years: 45.00    Types: Cigarettes  . Smokeless tobacco: Never Used     Comment: 3 per day  . Alcohol use No    Allergies  Allergen Reactions  . Clarithromycin Other (See Comments)    Makes her nervous  . Levofloxacin Other (See Comments)    nervousness  . Red Dye Hives and Itching    Current Outpatient Prescriptions  Medication Sig Dispense Refill  . acetaminophen (TYLENOL) 500 MG tablet Take 500 mg by mouth 2 (two) times daily.     .Marland Kitchen  acetaminophen-codeine (TYLENOL #3) 300-30 MG tablet     . amoxicillin (AMOXIL) 500 MG capsule     . aspirin EC 81 MG tablet Take 81 mg by mouth daily.    Marland Kitchen. doxycycline (VIBRAMYCIN) 100 MG capsule     . HYDROcodone-acetaminophen (NORCO) 10-325 MG tablet     . levocetirizine (XYZAL) 5 MG tablet Take 5 mg by mouth daily.    . montelukast (SINGULAIR) 10 MG tablet Take 10 mg by mouth at bedtime.    .  pantoprazole (PROTONIX) 40 MG tablet Take 40 mg by mouth daily before breakfast.     . penicillin v potassium (VEETID) 500 MG tablet     . triamcinolone (NASACORT) 55 MCG/ACT AERO nasal inhaler Place 1 spray into both nostrils daily. Reported on 03/11/2016    . clopidogrel (PLAVIX) 75 MG tablet Take 1 tablet (75 mg total) by mouth daily. (Patient not taking: Reported on 07/24/2017) 30 tablet 2  . oxyCODONE-acetaminophen (PERCOCET/ROXICET) 5-325 MG tablet Take 1-2 tablets by mouth every 4 (four) hours as needed for moderate pain. (Patient not taking: Reported on 04/17/2017) 15 tablet 0   Current Facility-Administered Medications  Medication Dose Route Frequency Provider Last Rate Last Dose  . simvastatin (ZOCOR) tablet 20 mg  20 mg Oral q1800 Maeola Harmanain, Brandon Christopher, MD        Review of Systems  Constitutional:  Constitutional negative. Eyes: Eyes negative.  Respiratory: Respiratory negative.  Cardiovascular: Positive for leg swelling.  GI: Gastrointestinal negative.  Musculoskeletal: Musculoskeletal negative.  Skin: Skin negative.  Neurological: Positive for numbness.  Hematologic: Hematologic/lymphatic negative.  Psychiatric: Psychiatric negative.        Objective:  Objective   Vitals:   07/24/17 1416  BP: 122/85  Pulse: 77  Resp: 18  Temp: 97.9 F (36.6 C)  TempSrc: Oral  SpO2: 97%  Weight: 146 lb 6.4 oz (66.4 kg)  Height: 5\' 2"  (1.575 m)   Body mass index is 26.78 kg/m.  Physical Exam  Constitutional: She is oriented to person, place, and time. She appears well-developed.  HENT:  Head: Normocephalic.  Eyes: Pupils are equal, round, and reactive to light.  Neck: Normal range of motion.  Cardiovascular: Normal rate.   Pulses:      Radial pulses are 2+ on the right side, and 2+ on the left side.       Popliteal pulses are 2+ on the right side, and 2+ on the left side.  Strong left dp/pt  Pulmonary/Chest: Effort normal.  Abdominal: Soft. She exhibits no mass.    Musculoskeletal: Normal range of motion. She exhibits no edema.  Neurological: She is alert and oriented to person, place, and time.  Skin: Skin is warm and dry.  Psychiatric: She has a normal mood and affect. Her behavior is normal. Judgment and thought content normal.    Data: Which shows velocities down to 32 cm/s in her ABI which is 1.04 on the left and 0.95 on the right.     Assessment/Plan:     [age] short female with previous left lower extremity bypass that has decreased velocities that are stable. At last visit we offered her intervention if she would quit smoking and she had that has done that. She will continue aspirin and I will send simvastatin to her pharmacy today and I counseled her on the risks of muscle pains and to stop this if that occurs. I also instructed her to notify her primary care doctor who can prescribe this medicine going forward. She  is planning cataract surgery in the very near future but also needs aortogram with left lower extremity likely intervention to maintain her bypass graft. Given that this will likely result in needing Plavix we will defer until after she has her cataract surgery. At that time she will call to schedule. She demonstrates very good understanding and will call sooner should she have left lower extremity problems.     Maeola Harman MD [facility]

## 2017-08-10 DIAGNOSIS — H25043 Posterior subcapsular polar age-related cataract, bilateral: Secondary | ICD-10-CM | POA: Diagnosis not present

## 2017-08-10 DIAGNOSIS — H25011 Cortical age-related cataract, right eye: Secondary | ICD-10-CM | POA: Diagnosis not present

## 2017-08-10 DIAGNOSIS — H18462 Peripheral corneal degeneration, left eye: Secondary | ICD-10-CM | POA: Diagnosis not present

## 2017-08-10 DIAGNOSIS — H2513 Age-related nuclear cataract, bilateral: Secondary | ICD-10-CM | POA: Diagnosis not present

## 2017-08-10 DIAGNOSIS — H25041 Posterior subcapsular polar age-related cataract, right eye: Secondary | ICD-10-CM | POA: Diagnosis not present

## 2017-08-10 DIAGNOSIS — H25013 Cortical age-related cataract, bilateral: Secondary | ICD-10-CM | POA: Diagnosis not present

## 2017-08-10 DIAGNOSIS — H2511 Age-related nuclear cataract, right eye: Secondary | ICD-10-CM | POA: Diagnosis not present

## 2017-08-25 DIAGNOSIS — H2511 Age-related nuclear cataract, right eye: Secondary | ICD-10-CM | POA: Diagnosis not present

## 2017-09-01 DIAGNOSIS — H2512 Age-related nuclear cataract, left eye: Secondary | ICD-10-CM | POA: Diagnosis not present

## 2017-09-04 ENCOUNTER — Other Ambulatory Visit: Payer: Self-pay

## 2017-09-08 DIAGNOSIS — Z961 Presence of intraocular lens: Secondary | ICD-10-CM | POA: Diagnosis not present

## 2017-09-08 DIAGNOSIS — H26492 Other secondary cataract, left eye: Secondary | ICD-10-CM | POA: Diagnosis not present

## 2017-09-23 ENCOUNTER — Encounter (HOSPITAL_COMMUNITY): Payer: Self-pay | Admitting: Vascular Surgery

## 2017-09-23 ENCOUNTER — Encounter (HOSPITAL_COMMUNITY)
Admission: RE | Disposition: A | Discharge status: Home / Self Care | Payer: Self-pay | Source: Ambulatory Visit | Attending: Vascular Surgery

## 2017-09-23 ENCOUNTER — Ambulatory Visit (HOSPITAL_COMMUNITY)
Admission: RE | Admit: 2017-09-23 | Discharge: 2017-09-23 | Disposition: A | Discharge status: Home / Self Care | Payer: PPO | Source: Ambulatory Visit | Attending: Vascular Surgery | Admitting: Vascular Surgery

## 2017-09-23 ENCOUNTER — Telehealth: Payer: Self-pay | Admitting: Vascular Surgery

## 2017-09-23 DIAGNOSIS — T82898A Other specified complication of vascular prosthetic devices, implants and grafts, initial encounter: Secondary | ICD-10-CM | POA: Diagnosis not present

## 2017-09-23 DIAGNOSIS — K219 Gastro-esophageal reflux disease without esophagitis: Secondary | ICD-10-CM | POA: Insufficient documentation

## 2017-09-23 DIAGNOSIS — F17210 Nicotine dependence, cigarettes, uncomplicated: Secondary | ICD-10-CM | POA: Diagnosis not present

## 2017-09-23 DIAGNOSIS — I739 Peripheral vascular disease, unspecified: Secondary | ICD-10-CM | POA: Insufficient documentation

## 2017-09-23 DIAGNOSIS — Z7982 Long term (current) use of aspirin: Secondary | ICD-10-CM | POA: Insufficient documentation

## 2017-09-23 DIAGNOSIS — Z7951 Long term (current) use of inhaled steroids: Secondary | ICD-10-CM | POA: Diagnosis not present

## 2017-09-23 DIAGNOSIS — E785 Hyperlipidemia, unspecified: Secondary | ICD-10-CM | POA: Diagnosis not present

## 2017-09-23 HISTORY — PX: ABDOMINAL AORTOGRAM W/LOWER EXTREMITY: CATH118223

## 2017-09-23 LAB — POCT I-STAT, CHEM 8
BUN: 8 mg/dL (ref 6–20)
Calcium, Ion: 1.17 mmol/L (ref 1.15–1.40)
Chloride: 107 mmol/L (ref 101–111)
Creatinine, Ser: 0.5 mg/dL (ref 0.44–1.00)
Glucose, Bld: 103 mg/dL — ABNORMAL HIGH (ref 65–99)
HCT: 40 % (ref 36.0–46.0)
Hemoglobin: 13.6 g/dL (ref 12.0–15.0)
Potassium: 4 mmol/L (ref 3.5–5.1)
Sodium: 142 mmol/L (ref 135–145)
TCO2: 26 mmol/L (ref 22–32)

## 2017-09-23 SURGERY — ABDOMINAL AORTOGRAM W/LOWER EXTREMITY
Anesthesia: LOCAL

## 2017-09-23 MED ORDER — SODIUM CHLORIDE 0.9 % WEIGHT BASED INFUSION: 1.0000 mL/kg/h | INTRAVENOUS | Status: DC

## 2017-09-23 MED ORDER — SODIUM CHLORIDE 0.9% FLUSH: 3.0000 mL | Freq: Two times a day (BID) | INTRAVENOUS | Status: DC

## 2017-09-23 MED ORDER — SODIUM CHLORIDE 0.9% FLUSH: 3.0000 mL | INTRAVENOUS | Status: DC | PRN

## 2017-09-23 MED ORDER — SODIUM CHLORIDE 0.9 % IV SOLN: 250.0000 mL | INTRAVENOUS | Status: DC | PRN

## 2017-09-23 MED ORDER — LABETALOL HCL 5 MG/ML IV SOLN: 10.0000 mg | INTRAVENOUS | Status: DC | PRN

## 2017-09-23 MED ORDER — FENTANYL CITRATE (PF) 100 MCG/2ML IJ SOLN
INTRAMUSCULAR | Status: AC
  Filled 2017-09-23: qty 2

## 2017-09-23 MED ORDER — LIDOCAINE HCL 2 % IJ SOLN
INTRAMUSCULAR | Status: AC
  Filled 2017-09-23: qty 10

## 2017-09-23 MED ORDER — MIDAZOLAM HCL 2 MG/2ML IJ SOLN
INTRAMUSCULAR | Status: DC | PRN
  Administered 2017-09-23 (×2): 1 mg via INTRAVENOUS

## 2017-09-23 MED ORDER — IODIXANOL 320 MG/ML IV SOLN
INTRAVENOUS | Status: DC | PRN
  Administered 2017-09-23: 97 mL via INTRA_ARTERIAL

## 2017-09-23 MED ORDER — SODIUM CHLORIDE 0.9 % IV SOLN
INTRAVENOUS | Status: DC
  Administered 2017-09-23: 08:00:00 via INTRAVENOUS

## 2017-09-23 MED ORDER — FENTANYL CITRATE (PF) 100 MCG/2ML IJ SOLN
INTRAMUSCULAR | Status: DC | PRN
Start: 2017-09-23 — End: 2017-09-23
  Administered 2017-09-23 (×2): 25 ug via INTRAVENOUS

## 2017-09-23 MED ORDER — HYDRALAZINE HCL 20 MG/ML IJ SOLN: 5.0000 mg | INTRAMUSCULAR | Status: DC | PRN

## 2017-09-23 MED ORDER — LIDOCAINE HCL 2 % IJ SOLN
INTRAMUSCULAR | Status: DC | PRN
Start: 2017-09-23 — End: 2017-09-23
  Administered 2017-09-23: 10 mL

## 2017-09-23 MED ORDER — MIDAZOLAM HCL 2 MG/2ML IJ SOLN
INTRAMUSCULAR | Status: AC
  Filled 2017-09-23: qty 2

## 2017-09-23 MED ORDER — HEPARIN (PORCINE) IN NACL 2-0.9 UNIT/ML-% IJ SOLN
INTRAMUSCULAR | Status: AC | PRN
  Administered 2017-09-23: 1000 mL via INTRA_ARTERIAL

## 2017-09-23 MED ORDER — HEPARIN (PORCINE) IN NACL 2-0.9 UNIT/ML-% IJ SOLN
INTRAMUSCULAR | Status: AC
  Filled 2017-09-23: qty 1000

## 2017-09-23 SURGICAL SUPPLY — 10 items
CATH OMNI FLUSH 5F 65CM (CATHETERS) ×2 IMPLANT
KIT MICROINTRODUCER STIFF 5F (SHEATH) ×2 IMPLANT
KIT PV (KITS) ×2 IMPLANT
SHEATH PINNACLE 5F 10CM (SHEATH) ×2 IMPLANT
STOPCOCK MORSE 400PSI 3WAY (MISCELLANEOUS) ×2 IMPLANT
SYRINGE MEDRAD AVANTA MACH 7 (SYRINGE) ×2 IMPLANT
TRANSDUCER W/STOPCOCK (MISCELLANEOUS) ×2 IMPLANT
TRAY PV CATH (CUSTOM PROCEDURE TRAY) ×2 IMPLANT
TUBING CIL FLEX 10 FLL-RA (TUBING) ×2 IMPLANT
WIRE BENTSON .035X145CM (WIRE) ×2 IMPLANT

## 2017-09-23 NOTE — Op Note (Signed)
    Patient name: [name] MRN: [id number] DOB: [birth year] Sex: female  09/23/2017 Pre-operative Diagnosis: decreased velocities in the left popliteal-popliteal artery bypass Post-operative diagnosis:  Same Surgeon:  Luanna Salk. Randie Heinz, MD Procedure Performed: 1.  US guided cannulation of right common femoral artery 2.  Aortogram with bilateral lower extremity runoff 3.  Moderate sedation with fentanyl and versed for 11 minutes  Indications:  66 year old female with history of up left-sided popliteal popliteal artery bypass with vein that has decreased velocities and is now indicated for angiogram.  Findings: The aortoiliac segments are free of flow the mid stenosis. Bilateral SFAs have less than 50% stenosis in the mid segments. The bypass on the left is patent with two-vessel runoff via the posterior tibial and peroneal arteries.   Procedure:  The patient was identified in the holding area and taken to room 8.  The patient was then placed supine on the table and prepped and draped in the usual sterile fashion.  A time out was called.  Ultrasound was used to evaluate the right common femoral artery.  It was patent .  A digital ultrasound image was acquired.  A micropuncture needle was used to access the right common femoral artery under ultrasound guidance.  An 018 wire was advanced without resistance and a micropuncture sheath was placed.  The 018 wire was removed and a benson wire was placed.  The micropuncture sheath was exchanged for a 5 french sheath.  An omniflush catheter was advanced over the wire to the level of L-1.  An abdominal angiogram was obtained.  Next we performed bilateral lower extremity runoff with the above findings.   Contrast: 97cc     Brandon C. Randie Heinz, MD [facility] [place] Office: [phone] Pager: [phone]

## 2017-09-23 NOTE — Telephone Encounter (Signed)
Sched appt 03/26/17; labs at 1:00 and MD at 2:15. Mailed appt letter.

## 2017-09-23 NOTE — Discharge Instructions (Signed)
Angiogram, Care After °This sheet gives you information about how to care for yourself after your procedure. Your health care provider may also give you more specific instructions. If you have problems or questions, contact your health care provider. °What can I expect after the procedure? °After the procedure, it is common to have bruising and tenderness at the catheter insertion area. °Follow these instructions at home: °Insertion site care  °· Follow instructions from your health care provider about how to take care of your insertion site. Make sure you: °¨ Wash your hands with soap and water before you change your bandage (dressing). If soap and water are not available, use hand sanitizer. °¨ Change your dressing as told by your health care provider. °¨ Leave stitches (sutures), skin glue, or adhesive strips in place. These skin closures may need to stay in place for 2 weeks or longer. If adhesive strip edges start to loosen and curl up, you may trim the loose edges. Do not remove adhesive strips completely unless your health care provider tells you to do that. °· Do not take baths, swim, or use a hot tub until your health care provider approves. °· You may shower 24-48 hours after the procedure or as told by your health care provider. °¨ Gently wash the site with plain soap and water. °¨ Pat the area dry with a clean towel. °¨ Do not rub the site. This may cause bleeding. °· Do not apply powder or lotion to the site. Keep the site clean and dry. °· Check your insertion site every day for signs of infection. Check for: °¨ Redness, swelling, or pain. °¨ Fluid or blood. °¨ Warmth. °¨ Pus or a bad smell. °Activity  °· Rest as told by your health care provider, usually for 1-2 days. °· Do not lift anything that is heavier than 10 lbs. (4.5 kg) or as told by your health care provider. °· Do not drive for 24 hours if you were given a medicine to help you relax (sedative). °· Do not drive or use heavy machinery while  taking prescription pain medicine. °General instructions  °· Return to your normal activities as told by your health care provider, usually in about a week. Ask your health care provider what activities are safe for you. °· If the catheter site starts bleeding, lie flat and put pressure on the site. If the bleeding does not stop, get help right away. This is a medical emergency. °· Drink enough fluid to keep your urine clear or pale yellow. This helps flush the contrast dye from your body. °· Take over-the-counter and prescription medicines only as told by your health care provider. °· Keep all follow-up visits as told by your health care provider. This is important. °Contact a health care provider if: °· You have a fever or chills. °· You have redness, swelling, or pain around your insertion site. °· You have fluid or blood coming from your insertion site. °· The insertion site feels warm to the touch. °· You have pus or a bad smell coming from your insertion site. °· You have bruising around the insertion site. °· You notice blood collecting in the tissue around the catheter site (hematoma). The hematoma may be painful to the touch. °Get help right away if: °· You have severe pain at the catheter insertion area. °· The catheter insertion area swells very fast. °· The catheter insertion area is bleeding, and the bleeding does not stop when you hold steady pressure on   the area. °· The area near or just beyond the catheter insertion site becomes pale, cool, tingly, or numb. °These symptoms may represent a serious problem that is an emergency. Do not wait to see if the symptoms will go away. Get medical help right away. Call your local emergency services (911 in the [place] Do not drive yourself to the hospital. °Summary °· After the procedure, it is common to have bruising and tenderness at the catheter insertion area. °· After the procedure, it is important to rest and drink plenty of fluids. °· Do not take baths,  swim, or use a hot tub until your health care provider says it is okay to do so. You may shower 24-48 hours after the procedure or as told by your health care provider. °· If the catheter site starts bleeding, lie flat and put pressure on the site. If the bleeding does not stop, get help right away. This is a medical emergency. °This information is not intended to replace advice given to you by your health care provider. Make sure you discuss any questions you have with your health care provider. °Document Released: 07/03/2005 Document Revised: 11/19/2016 Document Reviewed: 11/19/2016 °Elsevier Interactive Patient Education © 2017 [employer] ° °

## 2017-09-23 NOTE — H&P (Signed)
Patient ID: [name], female   DOB: 1951-08-02, 66 y.o.   MRN: [id number]  Reason for Consult: Re-evaluation (3 month )   Referred by Elfredia Nevins, MD  Subjective:     HPI:  [name] is a 66 y.o. female present previous left lower extremity bypass by Dr. Hart Rochester. I then performed balloon angioplasty of her SFA proximal of the bypass in attempt to the bypass open. At last follow-up she had decreased velocities and I was concern for impending graft failure. At that time I deferred any intervention given she was still smoking. Fortunately at this time she has quit smoking. She continues to stay very active. She is off of Plavix now takes aspirin only. She does inquire about statin drug today. 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 She is not having any issues related to today's visit.      Past Medical History:  Diagnosis Date  . GERD (gastroesophageal reflux disease)   . Hyperlipidemia   . Multiple allergies   . Peripheral vascular disease (HCC)    see 12/03/15- US arterial- shows borderline PAD   . Pneumonia    hx of 2016    History reviewed. No pertinent family history.      Past Surgical History:  Procedure Laterality Date  . ABDOMINAL HYSTERECTOMY    . CHOLECYSTECTOMY    . ENDARTERECTOMY FEMORAL Left 04/30/2016   Procedure: Endarterectomy of Left popliteal, anterior tibial and tibio-peroneal trunk;  Surgeon: Pryor Ochoa, MD;  Location: [facility];  Service: Vascular;  Laterality: Left;  . FEMORAL-POPLITEAL BYPASS GRAFT Left 04/30/2016   Procedure: Left Distal Superfical Femoral to Tibio-peroneal trunk bypass using non-reversed saphenous vein graph Left Leg;  Surgeon: Pryor Ochoa, MD;  [facility];  Service: Vascular;  Laterality: Left;  . INTRAOPERATIVE ARTERIOGRAM Left 04/30/2016   Procedure: Left Leg INTRA OPERATIVE ARTERIOGRAM;  Surgeon: Pryor Ochoa, MD;  Location: [facility] OR;  Service: Vascular;  Laterality:  Left;  . LUMBAR LAMINECTOMY/DECOMPRESSION MICRODISCECTOMY Left 12/26/2015   Procedure: LUMBAR LAMINECTOMY/DECOMPRESSION MICRODISCECTOMY 1 LEVEL L4-5 ON LEFT ;  Surgeon: Jene Every, MD;  Location: [facility] ORS;  Service: Orthopedics;  Laterality: Left;  . PERIPHERAL VASCULAR CATHETERIZATION N/A 03/25/2016   Procedure: Abdominal Aortogram w/Lower Extremity;  Surgeon: Nada Libman, MD;  Location: [facility] INVASIVE CV LAB;  Service: Cardiovascular;  Laterality: N/A;  . PERIPHERAL VASCULAR CATHETERIZATION N/A 08/20/2016   Procedure: Abdominal Aortogram w/Lower Extremity;  Surgeon: Maeola Harman, MD;  Location: [place] INVASIVE CV LAB;  Service: Cardiovascular;  Laterality: N/A;  . PERIPHERAL VASCULAR CATHETERIZATION Left 08/20/2016   Procedure: Peripheral Vascular Balloon Angioplasty;  Surgeon: Maeola Harman, MD;  [facility] LAB;  Service: Cardiovascular;  Laterality: Left;  Superficial femoral  . right ovary removal       Short Social History:        Social History  Substance Use Topics  . Smoking status: Current Every Day Smoker    Packs/day: 1.00    Years: 45.00    Types: Cigarettes  . Smokeless tobacco: Never Used     Comment: 3 per day  . Alcohol use No         Allergies  Allergen Reactions  . Clarithromycin Other (See Comments)    Makes her nervous  . Levofloxacin Other (See Comments)    nervousness  . Red Dye Hives and Itching          Current  Outpatient Prescriptions  Medication Sig Dispense Refill  . acetaminophen (TYLENOL) 500 MG tablet Take 500 mg by mouth 2 (two) times daily.     Marland Kitchen acetaminophen-codeine (TYLENOL #3) 300-30 MG tablet     . amoxicillin (AMOXIL) 500 MG capsule     . aspirin EC 81 MG tablet Take 81 mg by mouth daily.    Marland Kitchen doxycycline (VIBRAMYCIN) 100 MG capsule     . HYDROcodone-acetaminophen (NORCO) 10-325 MG tablet     . levocetirizine (XYZAL) 5 MG tablet Take 5 mg by mouth daily.    .  montelukast (SINGULAIR) 10 MG tablet Take 10 mg by mouth at bedtime.    . pantoprazole (PROTONIX) 40 MG tablet Take 40 mg by mouth daily before breakfast.     . penicillin v potassium (VEETID) 500 MG tablet     . triamcinolone (NASACORT) 55 MCG/ACT AERO nasal inhaler Place 1 spray into both nostrils daily. Reported on 03/11/2016    . clopidogrel (PLAVIX) 75 MG tablet Take 1 tablet (75 mg total) by mouth daily. (Patient not taking: Reported on 07/24/2017) 30 tablet 2  . oxyCODONE-acetaminophen (PERCOCET/ROXICET) 5-325 MG tablet Take 1-2 tablets by mouth every 4 (four) hours as needed for moderate pain. (Patient not taking: Reported on 04/17/2017) 15 tablet 0            Current Facility-Administered Medications  Medication Dose Route Frequency Provider Last Rate Last Dose  . simvastatin (ZOCOR) tablet 20 mg  20 mg Oral q1800 Maeola Harman, MD        Review of Systems  Constitutional:  Constitutional negative. Eyes: Eyes negative.  Respiratory: Respiratory negative.  Cardiovascular: Positive for leg swelling.  GI: Gastrointestinal negative.  Musculoskeletal: Musculoskeletal negative.  Skin: Skin negative.  Neurological: Positive for numbness.  Hematologic: Hematologic/lymphatic negative.  Psychiatric: Psychiatric negative.        Objective:  Objective   Vitals:   07/24/17 1416  BP: 122/85  Pulse: 77  Resp: 18  Temp: 97.9 F (36.6 C)  TempSrc: Oral  SpO2: 97%  Weight: 146 lb 6.4 oz (66.4 kg)  Height:  (1.575 m)   Body mass index is 26.78 kg/m.  Physical Exam  Constitutional: She is oriented to person, place, and time. She appears well-developed.  HENT:  Head: Normocephalic.  Eyes: Pupils are equal, round, and reactive to light.  Neck: Normal range of motion.  Cardiovascular: Normal rate.   Pulses:      Radial pulses are 2+ on the right side, and 2+ on the left side.       Popliteal pulses are 2+ on the right side, and 2+ on the left  side.  Strong left dp/pt  Pulmonary/Chest: Effort normal.  Abdominal: Soft. She exhibits no mass.  Musculoskeletal: Normal range of motion. She exhibits no edema.  Neurological: She is alert and oriented to person, place, and time.  Skin: Skin is warm and dry.  Psychiatric: She has a normal mood and affect. Her behavior is normal. Judgment and thought content normal.    Data: Which shows velocities down to 32 cm/s in her ABI which is 1.04 on the left and 0.95 on the right.     Assessment/Plan:   66yo female with previous left lower extremity bypass that has decreased velocities that are stable. Plan for angiogram left lower extremity.    Maeola Harman MD [facility]

## 2017-09-23 NOTE — Telephone Encounter (Signed)
-----   Message from Sharee Pimple, RN sent at 09/23/2017  9:33 AM EDT ----- Regarding: 6 MONTHS   ----- Message ----- From: Maeola Harman, MD Sent: 09/23/2017   9:09 AM To: [facility] [address]  [name] [id number] 1951/10/16  09/23/2017 Pre-operative Diagnosis: decreased velocities in the left popliteal-popliteal artery bypass Post-operative diagnosis:  Same Surgeon:  Apolinar Junes C. Randie Heinz, MD Procedure Performed: 1.  US guided cannulation of right common femoral artery 2.  Aortogram with bilateral lower extremity runoff 3.  Moderate sedation with fentanyl and versed for 11 minutes  F/u in 6 months with abi and left leg bypass duplex

## 2017-09-23 NOTE — Progress Notes (Addendum)
Site area: Right groin a 5 french arterial sheath was removed  Site Prior to Removal:  Level 0  Pressure Applied For 20 MINUTES    Bedrest Beginning at 0940a  Manual:   Yes.    Patient Status During Pull:  stable  Post Pull Groin Site:  Level 0  Post Pull Instructions Given:  Yes.    Post Pull Pulses Present:  Yes.    Dressing Applied:  Yes.    Comments:  VS remain stable during sheath was removed

## 2017-11-03 DIAGNOSIS — M47816 Spondylosis without myelopathy or radiculopathy, lumbar region: Secondary | ICD-10-CM | POA: Diagnosis not present

## 2018-01-07 DIAGNOSIS — J3489 Other specified disorders of nose and nasal sinuses: Secondary | ICD-10-CM | POA: Diagnosis not present

## 2018-01-07 DIAGNOSIS — E663 Overweight: Secondary | ICD-10-CM | POA: Diagnosis not present

## 2018-01-07 DIAGNOSIS — Z6826 Body mass index (BMI) 26.0-26.9, adult: Secondary | ICD-10-CM | POA: Diagnosis not present

## 2018-01-07 DIAGNOSIS — J019 Acute sinusitis, unspecified: Secondary | ICD-10-CM | POA: Diagnosis not present

## 2018-01-18 DIAGNOSIS — Z79891 Long term (current) use of opiate analgesic: Secondary | ICD-10-CM | POA: Insufficient documentation

## 2018-02-23 DIAGNOSIS — M47816 Spondylosis without myelopathy or radiculopathy, lumbar region: Secondary | ICD-10-CM | POA: Diagnosis not present

## 2018-02-25 ENCOUNTER — Other Ambulatory Visit: Payer: Self-pay

## 2018-02-25 DIAGNOSIS — I739 Peripheral vascular disease, unspecified: Secondary | ICD-10-CM

## 2018-03-02 DIAGNOSIS — E663 Overweight: Secondary | ICD-10-CM | POA: Diagnosis not present

## 2018-03-02 DIAGNOSIS — J3489 Other specified disorders of nose and nasal sinuses: Secondary | ICD-10-CM | POA: Diagnosis not present

## 2018-03-02 DIAGNOSIS — Z6827 Body mass index (BMI) 27.0-27.9, adult: Secondary | ICD-10-CM | POA: Diagnosis not present

## 2018-03-02 DIAGNOSIS — R05 Cough: Secondary | ICD-10-CM | POA: Diagnosis not present

## 2018-03-02 DIAGNOSIS — J019 Acute sinusitis, unspecified: Secondary | ICD-10-CM | POA: Diagnosis not present

## 2018-03-12 ENCOUNTER — Ambulatory Visit (INDEPENDENT_AMBULATORY_CARE_PROVIDER_SITE_OTHER): Payer: PPO | Admitting: Vascular Surgery

## 2018-03-12 ENCOUNTER — Encounter: Payer: Self-pay | Admitting: Vascular Surgery

## 2018-03-12 ENCOUNTER — Ambulatory Visit (HOSPITAL_COMMUNITY)
Admission: RE | Admit: 2018-03-12 | Discharge: 2018-03-12 | Disposition: A | Discharge status: Home / Self Care | Payer: PPO | Source: Ambulatory Visit | Attending: Vascular Surgery | Admitting: Vascular Surgery

## 2018-03-12 ENCOUNTER — Ambulatory Visit (INDEPENDENT_AMBULATORY_CARE_PROVIDER_SITE_OTHER)
Admission: RE | Admit: 2018-03-12 | Discharge: 2018-03-12 | Disposition: A | Discharge status: Home / Self Care | Payer: PPO | Source: Ambulatory Visit | Attending: Vascular Surgery | Admitting: Vascular Surgery

## 2018-03-12 VITALS — BP 133/80 | HR 84 | Resp 18 | Ht 63.0 in | Wt 151.9 lb

## 2018-03-12 DIAGNOSIS — I739 Peripheral vascular disease, unspecified: Secondary | ICD-10-CM | POA: Diagnosis not present

## 2018-03-12 NOTE — Progress Notes (Signed)
History of Present Illness:  Patient is a 67 y.o. year old female who presents for evaluation of of her lower extremities due to history of PAD.  S/P SFA to tibial truck Bypass by Dr. Hart Rochester 04/30/2016.  She recently under went angiogram for diagnostics after her office visit.  At that time her her ABI's showed velocities down to 32 cm/s.  The angiogram showed patent bypass and no intervention was needed.    She is here today for repeat ABI's.  She reports that her and her husband have stopped smoking.  She is staying active and works 2 jobs.  She has no symptoms of claudication and can walk as far as she likes.        Past Medical History:  Diagnosis Date  . GERD (gastroesophageal reflux disease)   . Hyperlipidemia   . Multiple allergies   . Peripheral vascular disease (HCC)    see 12/03/15- US arterial- shows borderline PAD   . Pneumonia    hx of 2016     Past Surgical History:  Procedure Laterality Date  . ABDOMINAL AORTOGRAM W/LOWER EXTREMITY N/A 09/23/2017   Procedure: ABDOMINAL AORTOGRAM W/LOWER EXTREMITY;  Surgeon: Maeola Harman, MD;  Location[facility] INVASIVE CV LAB;  Service: Cardiovascular;  Laterality: N/A;  . ABDOMINAL HYSTERECTOMY    . CHOLECYSTECTOMY    . ENDARTERECTOMY FEMORAL Left 04/30/2016   Procedure: Endarterectomy of Left popliteal, anterior tibial and tibio-peroneal trunk;  Surgeon: Pryor Ochoa, MD;  Location: [facility] OR;  Service: Vascular;  Laterality: Left;  . FEMORAL-POPLITEAL BYPASS GRAFT Left 04/30/2016   Procedure: Left Distal Superfical Femoral to Tibio-peroneal trunk bypass using non-reversed saphenous vein graph Left Leg;  Surgeon: Pryor Ochoa, MD;  Location: [facility] OR;  Service: Vascular;  Laterality: Left;  . INTRAOPERATIVE ARTERIOGRAM Left 04/30/2016   Procedure: Left Leg INTRA OPERATIVE ARTERIOGRAM;  Surgeon: Pryor Ochoa, MD;  Location: [facility] OR;  Service: Vascular;  Laterality: Left;  . LUMBAR LAMINECTOMY/DECOMPRESSION MICRODISCECTOMY Left  12/26/2015   Procedure: LUMBAR LAMINECTOMY/DECOMPRESSION MICRODISCECTOMY 1 LEVEL L4-5 ON LEFT ;  Surgeon: Jene Every, MD;  Location: [facility] ORS;  Service: Orthopedics;  Laterality: Left;  . PERIPHERAL VASCULAR CATHETERIZATION N/A 03/25/2016   Procedure: Abdominal Aortogram w/Lower Extremity;  Surgeon: Nada Libman, MD;  Location: [facility] INVASIVE CV LAB;  Service: Cardiovascular;  Laterality: N/A;  . PERIPHERAL VASCULAR CATHETERIZATION N/A 08/20/2016   Procedure: Abdominal Aortogram w/Lower Extremity;  Surgeon: Maeola Harman, MD;  Location: [facility] INVASIVE CV LAB;  Service: Cardiovascular;  Laterality: N/A;  . PERIPHERAL VASCULAR CATHETERIZATION Left 08/20/2016   Procedure: Peripheral Vascular Balloon Angioplasty;  Surgeon: Maeola Harman, MD;  Location: [facility] INVASIVE CV LAB;  Service: Cardiovascular;  Laterality: Left;  Superficial femoral  . right ovary removal       ROS:   General:  No weight loss, Fever, chills  HEENT: No recent headaches, no nasal bleeding, no visual changes, no sore throat  Neurologic: No dizziness, blackouts, seizures. No recent symptoms of stroke or mini- stroke. No recent episodes of slurred speech, or temporary blindness.  Cardiac: No recent episodes of chest pain/pressure, no shortness of breath at rest.  No shortness of breath with exertion.  Denies history of atrial fibrillation or irregular heartbeat  Vascular: No history of rest pain in feet.  No history of claudication.  No history of non-healing ulcer, No history of DVT   Pulmonary: No home oxygen, no productive cough, no hemoptysis,  No asthma or wheezing  Musculoskeletal:  [ ]  Arthritis, [ ]  Low back pain,  [ ]  Joint pain  Hematologic:No history of hypercoagulable state.  No history of easy bleeding.  No history of anemia  Gastrointestinal: No hematochezia or melena,  No gastroesophageal reflux, no trouble swallowing  Urinary: [ ]  chronic Kidney disease, [ ]  on HD - [ ]  MWF or [ ]  TTHS, [ ]   Burning with urination, [ ]  Frequent urination, [ ]  Difficulty urinating;   Skin: No rashes  Psychological: No history of anxiety,  No history of depression  Social History Social History   Tobacco Use  . Smoking status: Current Every Day Smoker    Packs/day: 1.00    Years: 45.00    Pack years: 45.00    Types: Cigarettes  . Smokeless tobacco: Never Used  . Tobacco comment: 3 per day  Substance Use Topics  . Alcohol use: No    Alcohol/week: 0.0 oz  . Drug use: No    Family History History reviewed. No pertinent family history.  Allergies  Allergies  Allergen Reactions  . Clarithromycin Other (See Comments)    Makes her nervous  . Levofloxacin Other (See Comments)    nervousness  . Red Dye Hives and Itching     Current Outpatient Medications  Medication Sig Dispense Refill  . acetaminophen (TYLENOL) 325 MG tablet Take 650 mg by mouth every 6 (six) hours as needed for mild pain or moderate pain.    Marland Kitchen. aspirin EC 81 MG tablet Take 81 mg by mouth daily.    Marland Kitchen. levocetirizine (XYZAL) 5 MG tablet Take 5 mg by mouth every evening.     . montelukast (SINGULAIR) 10 MG tablet Take 10 mg by mouth at bedtime.    . pantoprazole (PROTONIX) 40 MG tablet Take 40 mg by mouth daily before breakfast.      Current Facility-Administered Medications  Medication Dose Route Frequency Provider Last Rate Last Dose  . simvastatin (ZOCOR) tablet 20 mg  20 mg Oral q1800 Maeola Harmanain, Brandon Christopher, MD        Physical Examination  Vitals:   03/12/18 1426  BP: 133/80  Pulse: 84  Resp: 18  SpO2: 95%  Weight: 151 lb 14.4 oz (68.9 kg)  Height: 5\' 3"  (1.6 m)    Body mass index is 26.91 kg/m.  General:  Alert and oriented, no acute distress HEENT: Normal Neck: No bruit or JVD Pulmonary: Clear to auscultation bilaterally Cardiac: Regular Rate and Rhythm without murmur Abdomen: Soft, non-tender, non-distended, no mass, no scars Skin: No rash Extremity Pulses:  2+ radial, brachial,  femoral, dorsalis pedis,  pulses bilaterally Musculoskeletal: No deformity or edema  Neurologic: Upper and lower extremity motor 5/5 and symmetric  DATA:  ABI's 03/12/2018 Right 0.93 biphasic flow Left > 1.0 biphasic flow   ASSESSMENT:  PAD s/p left SFA to TPT by pass   PLAN:  The by pass in patent and she is asymptomatic without complaints of cal pain , no non healing ulcers and no rest pain.  At this time we will schedule her to follow up in one year with our NP Delynn FlavinSuzanne Nickle.     Mosetta PigeonEmma Maureen Charlynn Salih PA-C [facility] [place]    I have interviewed and examined patient with PA and agree with assessment and plan above.   Brandon C. Randie Heinzain, MD [facility] [place] Office: [phone] Pager: [phone]

## 2018-03-26 ENCOUNTER — Ambulatory Visit: Payer: PPO | Admitting: Vascular Surgery

## 2018-03-26 ENCOUNTER — Encounter (HOSPITAL_COMMUNITY): Payer: PPO

## 2018-03-26 ENCOUNTER — Other Ambulatory Visit (HOSPITAL_COMMUNITY): Payer: PPO

## 2018-04-08 DIAGNOSIS — J329 Chronic sinusitis, unspecified: Secondary | ICD-10-CM | POA: Diagnosis not present

## 2018-04-08 DIAGNOSIS — H6692 Otitis media, unspecified, left ear: Secondary | ICD-10-CM | POA: Diagnosis not present

## 2018-04-08 DIAGNOSIS — H6092 Unspecified otitis externa, left ear: Secondary | ICD-10-CM | POA: Diagnosis not present

## 2018-04-08 DIAGNOSIS — Z6827 Body mass index (BMI) 27.0-27.9, adult: Secondary | ICD-10-CM | POA: Diagnosis not present

## 2018-04-08 DIAGNOSIS — E663 Overweight: Secondary | ICD-10-CM | POA: Diagnosis not present

## 2018-05-03 DIAGNOSIS — Z1389 Encounter for screening for other disorder: Secondary | ICD-10-CM | POA: Diagnosis not present

## 2018-05-03 DIAGNOSIS — Z6827 Body mass index (BMI) 27.0-27.9, adult: Secondary | ICD-10-CM | POA: Diagnosis not present

## 2018-05-03 DIAGNOSIS — J302 Other seasonal allergic rhinitis: Secondary | ICD-10-CM | POA: Diagnosis not present

## 2018-05-03 DIAGNOSIS — E663 Overweight: Secondary | ICD-10-CM | POA: Diagnosis not present

## 2018-05-03 DIAGNOSIS — R55 Syncope and collapse: Secondary | ICD-10-CM | POA: Diagnosis not present

## 2018-05-03 DIAGNOSIS — E782 Mixed hyperlipidemia: Secondary | ICD-10-CM | POA: Diagnosis not present

## 2018-05-03 DIAGNOSIS — J449 Chronic obstructive pulmonary disease, unspecified: Secondary | ICD-10-CM | POA: Diagnosis not present

## 2018-05-03 DIAGNOSIS — F17290 Nicotine dependence, other tobacco product, uncomplicated: Secondary | ICD-10-CM | POA: Diagnosis not present

## 2018-06-15 DIAGNOSIS — G894 Chronic pain syndrome: Secondary | ICD-10-CM | POA: Diagnosis not present

## 2018-06-15 DIAGNOSIS — M545 Low back pain, unspecified: Secondary | ICD-10-CM | POA: Insufficient documentation

## 2018-06-15 DIAGNOSIS — M4726 Other spondylosis with radiculopathy, lumbar region: Secondary | ICD-10-CM | POA: Diagnosis not present

## 2018-06-15 DIAGNOSIS — M5416 Radiculopathy, lumbar region: Secondary | ICD-10-CM | POA: Diagnosis not present

## 2018-06-15 DIAGNOSIS — Z79891 Long term (current) use of opiate analgesic: Secondary | ICD-10-CM | POA: Diagnosis not present

## 2018-07-06 DIAGNOSIS — M47816 Spondylosis without myelopathy or radiculopathy, lumbar region: Secondary | ICD-10-CM | POA: Diagnosis not present

## 2018-07-12 DIAGNOSIS — E663 Overweight: Secondary | ICD-10-CM | POA: Diagnosis not present

## 2018-07-12 DIAGNOSIS — J22 Unspecified acute lower respiratory infection: Secondary | ICD-10-CM | POA: Diagnosis not present

## 2018-07-12 DIAGNOSIS — Z6827 Body mass index (BMI) 27.0-27.9, adult: Secondary | ICD-10-CM | POA: Diagnosis not present

## 2018-07-12 DIAGNOSIS — J301 Allergic rhinitis due to pollen: Secondary | ICD-10-CM | POA: Diagnosis not present

## 2018-09-21 DIAGNOSIS — J309 Allergic rhinitis, unspecified: Secondary | ICD-10-CM | POA: Diagnosis not present

## 2018-09-21 DIAGNOSIS — J449 Chronic obstructive pulmonary disease, unspecified: Secondary | ICD-10-CM | POA: Diagnosis not present

## 2018-09-21 DIAGNOSIS — E782 Mixed hyperlipidemia: Secondary | ICD-10-CM | POA: Diagnosis not present

## 2018-09-21 DIAGNOSIS — Z6827 Body mass index (BMI) 27.0-27.9, adult: Secondary | ICD-10-CM | POA: Diagnosis not present

## 2018-09-21 DIAGNOSIS — Z0001 Encounter for general adult medical examination with abnormal findings: Secondary | ICD-10-CM | POA: Diagnosis not present

## 2018-09-21 DIAGNOSIS — K219 Gastro-esophageal reflux disease without esophagitis: Secondary | ICD-10-CM | POA: Diagnosis not present

## 2018-09-21 DIAGNOSIS — I739 Peripheral vascular disease, unspecified: Secondary | ICD-10-CM | POA: Diagnosis not present

## 2018-09-24 DIAGNOSIS — Z1211 Encounter for screening for malignant neoplasm of colon: Secondary | ICD-10-CM | POA: Diagnosis not present

## 2018-09-29 ENCOUNTER — Other Ambulatory Visit (HOSPITAL_COMMUNITY): Payer: Self-pay | Admitting: Physician Assistant

## 2018-09-29 DIAGNOSIS — Z1231 Encounter for screening mammogram for malignant neoplasm of breast: Secondary | ICD-10-CM

## 2018-10-13 DIAGNOSIS — M545 Low back pain: Secondary | ICD-10-CM | POA: Diagnosis not present

## 2018-10-13 DIAGNOSIS — M5416 Radiculopathy, lumbar region: Secondary | ICD-10-CM | POA: Diagnosis not present

## 2018-10-13 DIAGNOSIS — Z79899 Other long term (current) drug therapy: Secondary | ICD-10-CM | POA: Diagnosis not present

## 2018-11-29 DIAGNOSIS — Z79891 Long term (current) use of opiate analgesic: Secondary | ICD-10-CM | POA: Diagnosis not present

## 2018-12-02 DIAGNOSIS — M5136 Other intervertebral disc degeneration, lumbar region: Secondary | ICD-10-CM | POA: Diagnosis not present

## 2019-01-28 DIAGNOSIS — M533 Sacrococcygeal disorders, not elsewhere classified: Secondary | ICD-10-CM | POA: Diagnosis not present

## 2019-02-06 DIAGNOSIS — R5383 Other fatigue: Secondary | ICD-10-CM | POA: Diagnosis not present

## 2019-02-06 DIAGNOSIS — R5381 Other malaise: Secondary | ICD-10-CM | POA: Diagnosis not present

## 2019-02-06 DIAGNOSIS — J101 Influenza due to other identified influenza virus with other respiratory manifestations: Secondary | ICD-10-CM | POA: Diagnosis not present

## 2019-02-06 DIAGNOSIS — R05 Cough: Secondary | ICD-10-CM | POA: Diagnosis not present

## 2019-02-08 DIAGNOSIS — Z6827 Body mass index (BMI) 27.0-27.9, adult: Secondary | ICD-10-CM | POA: Diagnosis not present

## 2019-02-08 DIAGNOSIS — J019 Acute sinusitis, unspecified: Secondary | ICD-10-CM | POA: Diagnosis not present

## 2019-02-08 DIAGNOSIS — E663 Overweight: Secondary | ICD-10-CM | POA: Diagnosis not present

## 2019-02-08 DIAGNOSIS — Z1389 Encounter for screening for other disorder: Secondary | ICD-10-CM | POA: Diagnosis not present

## 2019-02-08 DIAGNOSIS — J111 Influenza due to unidentified influenza virus with other respiratory manifestations: Secondary | ICD-10-CM | POA: Diagnosis not present

## 2019-02-16 DIAGNOSIS — M533 Sacrococcygeal disorders, not elsewhere classified: Secondary | ICD-10-CM | POA: Insufficient documentation

## 2019-03-15 DIAGNOSIS — Z6827 Body mass index (BMI) 27.0-27.9, adult: Secondary | ICD-10-CM | POA: Diagnosis not present

## 2019-03-15 DIAGNOSIS — J019 Acute sinusitis, unspecified: Secondary | ICD-10-CM | POA: Diagnosis not present

## 2019-03-15 DIAGNOSIS — E663 Overweight: Secondary | ICD-10-CM | POA: Diagnosis not present

## 2019-03-15 DIAGNOSIS — Z1389 Encounter for screening for other disorder: Secondary | ICD-10-CM | POA: Diagnosis not present

## 2019-03-15 DIAGNOSIS — J309 Allergic rhinitis, unspecified: Secondary | ICD-10-CM | POA: Diagnosis not present

## 2019-04-06 DIAGNOSIS — Z681 Body mass index (BMI) 19 or less, adult: Secondary | ICD-10-CM | POA: Diagnosis not present

## 2019-04-06 DIAGNOSIS — N401 Enlarged prostate with lower urinary tract symptoms: Secondary | ICD-10-CM | POA: Diagnosis not present

## 2019-04-06 DIAGNOSIS — R3915 Urgency of urination: Secondary | ICD-10-CM | POA: Diagnosis not present

## 2019-04-06 DIAGNOSIS — R350 Frequency of micturition: Secondary | ICD-10-CM | POA: Diagnosis not present

## 2019-04-06 DIAGNOSIS — Z0001 Encounter for general adult medical examination with abnormal findings: Secondary | ICD-10-CM | POA: Diagnosis not present

## 2019-04-06 DIAGNOSIS — Z1231 Encounter for screening mammogram for malignant neoplasm of breast: Secondary | ICD-10-CM | POA: Diagnosis not present

## 2019-04-06 DIAGNOSIS — Z1389 Encounter for screening for other disorder: Secondary | ICD-10-CM | POA: Diagnosis not present

## 2019-06-27 DIAGNOSIS — J0100 Acute maxillary sinusitis, unspecified: Secondary | ICD-10-CM | POA: Diagnosis not present

## 2019-06-27 DIAGNOSIS — E663 Overweight: Secondary | ICD-10-CM | POA: Diagnosis not present

## 2019-06-27 DIAGNOSIS — N342 Other urethritis: Secondary | ICD-10-CM | POA: Diagnosis not present

## 2019-06-27 DIAGNOSIS — Z6826 Body mass index (BMI) 26.0-26.9, adult: Secondary | ICD-10-CM | POA: Diagnosis not present

## 2019-07-09 DIAGNOSIS — R300 Dysuria: Secondary | ICD-10-CM | POA: Diagnosis not present

## 2019-07-25 DIAGNOSIS — M2011 Hallux valgus (acquired), right foot: Secondary | ICD-10-CM | POA: Diagnosis not present

## 2019-07-25 DIAGNOSIS — M79671 Pain in right foot: Secondary | ICD-10-CM | POA: Diagnosis not present

## 2019-07-25 DIAGNOSIS — M7740 Metatarsalgia, unspecified foot: Secondary | ICD-10-CM | POA: Diagnosis not present

## 2019-07-26 DIAGNOSIS — Z79891 Long term (current) use of opiate analgesic: Secondary | ICD-10-CM | POA: Diagnosis not present

## 2019-08-22 DIAGNOSIS — M79671 Pain in right foot: Secondary | ICD-10-CM | POA: Diagnosis not present

## 2019-08-22 DIAGNOSIS — M2011 Hallux valgus (acquired), right foot: Secondary | ICD-10-CM | POA: Diagnosis not present

## 2019-09-07 DIAGNOSIS — Z6825 Body mass index (BMI) 25.0-25.9, adult: Secondary | ICD-10-CM | POA: Diagnosis not present

## 2019-09-07 DIAGNOSIS — E663 Overweight: Secondary | ICD-10-CM | POA: Diagnosis not present

## 2019-09-07 DIAGNOSIS — J019 Acute sinusitis, unspecified: Secondary | ICD-10-CM | POA: Diagnosis not present

## 2019-12-19 DIAGNOSIS — Z6825 Body mass index (BMI) 25.0-25.9, adult: Secondary | ICD-10-CM | POA: Diagnosis not present

## 2019-12-19 DIAGNOSIS — J019 Acute sinusitis, unspecified: Secondary | ICD-10-CM | POA: Diagnosis not present

## 2019-12-19 DIAGNOSIS — J22 Unspecified acute lower respiratory infection: Secondary | ICD-10-CM | POA: Diagnosis not present

## 2019-12-19 DIAGNOSIS — E663 Overweight: Secondary | ICD-10-CM | POA: Diagnosis not present

## 2019-12-27 DIAGNOSIS — Z79891 Long term (current) use of opiate analgesic: Secondary | ICD-10-CM | POA: Diagnosis not present

## 2019-12-27 DIAGNOSIS — M25562 Pain in left knee: Secondary | ICD-10-CM | POA: Diagnosis not present

## 2020-02-06 DIAGNOSIS — M79602 Pain in left arm: Secondary | ICD-10-CM | POA: Diagnosis not present

## 2020-02-06 DIAGNOSIS — Z79899 Other long term (current) drug therapy: Secondary | ICD-10-CM | POA: Diagnosis not present

## 2020-02-27 ENCOUNTER — Ambulatory Visit
Admission: EM | Admit: 2020-02-27 | Discharge: 2020-02-27 | Disposition: A | Discharge status: Home / Self Care | Payer: PPO | Attending: Emergency Medicine | Admitting: Emergency Medicine

## 2020-02-27 ENCOUNTER — Other Ambulatory Visit: Payer: Self-pay

## 2020-02-27 DIAGNOSIS — B9789 Other viral agents as the cause of diseases classified elsewhere: Secondary | ICD-10-CM

## 2020-02-27 DIAGNOSIS — Z20822 Contact with and (suspected) exposure to covid-19: Secondary | ICD-10-CM

## 2020-02-27 MED ORDER — DEXAMETHASONE SODIUM PHOSPHATE 10 MG/ML IJ SOLN
10.0000 mg | Freq: Once | INTRAMUSCULAR | Status: AC
  Administered 2020-02-27: 14:00:00 10 mg via INTRAMUSCULAR

## 2020-02-27 MED ORDER — AMOXICILLIN-POT CLAVULANATE 875-125 MG PO TABS: 1.0000 | ORAL_TABLET | Freq: Two times a day (BID) | ORAL | 0 refills | Status: AC

## 2020-02-27 NOTE — Discharge Instructions (Addendum)
COVID test ordered.  Someone will contact you regarding abnormal results between 5-7 days otherwise you may call for your results  In the meantime: You should remain isolated in your home for 10 days from symptom onset AND greater than 72 hours after symptoms resolution (absence of fever without the use of fever-reducing medication and improvement in respiratory symptoms), whichever is longer Get plenty of rest and push fluids Steroid shot given in office per patient request Augmentin prescribed.  Prescription will be available at your pharmacy on 3/3 if symptoms are still persisting or not improving.   Use OTC medications like ibuprofen or tylenol as needed fever or pain Follow up with PCP in 1-2 days for recheck via phone or video to ensure symptoms are improving Call or go to the ED if you have any new or worsening symptoms such as fever, cough, shortness of breath, chest tightness, chest pain, turning blue, changes in mental status, etc..Marland Kitchen

## 2020-02-27 NOTE — ED Triage Notes (Signed)
Pt presents to UC w/ c/o sinus pressure on cheeks and forehead x3 days. Pt states she took allergy meds w/o relief.

## 2020-02-27 NOTE — ED Provider Notes (Signed)
[facility]   [id number] 02/27/20 Arrival Time: 9937   CC: COVID symptoms  SUBJECTIVE: History from: patient.  [name] is a 69 y.o. female who presents with sinus pain, pressure, and congestion x 3 days.  Denies sick exposure to COVID, flu or strep.  Denies recent travel.  Has tried OTC allergy medication without relief.  Symptoms are made worse with going outside and leaning forward.  Reports previous symptoms in the past with sinus infection.   Denies body aches, fever, chills, fatigue, rhinorrhea, sore throat, cough, SOB, wheezing, chest pain, nausea, vomiting, changes in bowel or bladder habits.    ROS: As per HPI.  All other pertinent ROS negative.     Past Medical History:  Diagnosis Date  . GERD (gastroesophageal reflux disease)   . Hyperlipidemia   . Multiple allergies   . Peripheral vascular disease ([place])    see 12/03/15- US arterial- shows borderline PAD   . Pneumonia    hx of 2016    Past Surgical History:  Procedure Laterality Date  . ABDOMINAL AORTOGRAM W/LOWER EXTREMITY N/A 09/23/2017   Procedure: ABDOMINAL AORTOGRAM W/LOWER EXTREMITY;  Surgeon: Waynetta Sandy, MD;  Location: [place] CV LAB;  Service: Cardiovascular;  Laterality: N/A;  . ABDOMINAL HYSTERECTOMY    . CHOLECYSTECTOMY    . ENDARTERECTOMY FEMORAL Left 04/30/2016   Procedure: Endarterectomy of Left popliteal, anterior tibial and tibio-peroneal trunk;  Surgeon: Mal Misty, MD;  Location: [place];  Service: Vascular;  Laterality: Left;  . FEMORAL-POPLITEAL BYPASS GRAFT Left 04/30/2016   Procedure: Left Distal Superfical Femoral to Tibio-peroneal trunk bypass using non-reversed saphenous vein graph Left Leg;  Surgeon: Mal Misty, MD;  Location: [place];  Service: Vascular;  Laterality: Left;  . INTRAOPERATIVE ARTERIOGRAM Left 04/30/2016   Procedure: Left Leg INTRA OPERATIVE ARTERIOGRAM;  Surgeon: Mal Misty, MD;  Location: [place];  Service: Vascular;  Laterality: Left;  .  LUMBAR LAMINECTOMY/DECOMPRESSION MICRODISCECTOMY Left 12/26/2015   Procedure: LUMBAR LAMINECTOMY/DECOMPRESSION MICRODISCECTOMY 1 LEVEL L4-5 ON LEFT ;  Surgeon: Susa Day, MD;  Location: [facility] ORS;  Service: Orthopedics;  Laterality: Left;  . PERIPHERAL VASCULAR CATHETERIZATION N/A 03/25/2016   Procedure: Abdominal Aortogram w/Lower Extremity;  Surgeon: Serafina Mitchell, MD;  Location: [place] CV LAB;  Service: Cardiovascular;  Laterality: N/A;  . PERIPHERAL VASCULAR CATHETERIZATION N/A 08/20/2016   Procedure: Abdominal Aortogram w/Lower Extremity;  Surgeon: Waynetta Sandy, MD;  Location: [place] CV LAB;  Service: Cardiovascular;  Laterality: N/A;  . PERIPHERAL VASCULAR CATHETERIZATION Left 08/20/2016   Procedure: Peripheral Vascular Balloon Angioplasty;  Surgeon: Waynetta Sandy, MD;  Location: [place] CV LAB;  Service: Cardiovascular;  Laterality: Left;  Superficial femoral  . right ovary removal      Allergies  Allergen Reactions  . Clarithromycin Other (See Comments)    Makes her nervous  . Levofloxacin Other (See Comments)    nervousness  . Red Dye Hives and Itching   No current facility-administered medications on file prior to encounter.   Current Outpatient Medications on File Prior to Encounter  Medication Sig Dispense Refill  . acetaminophen (TYLENOL) 325 MG tablet Take 650 mg by mouth every 6 (six) hours as needed for mild pain or moderate pain.    Marland Kitchen aspirin EC 81 MG tablet Take 81 mg by mouth daily.    Marland Kitchen levocetirizine (XYZAL) 5 MG tablet Take 5 mg by mouth every evening.     . montelukast (SINGULAIR) 10 MG tablet Take 10  mg by mouth at bedtime.    . pantoprazole (PROTONIX) 40 MG tablet Take 40 mg by mouth daily before breakfast.      Social History   Socioeconomic History  . Marital status: Married    Spouse name: Not on file  . Number of children: Not on file  . Years of education: Not on file  . Highest education level: Not on file    Occupational History  . Not on file  Tobacco Use  . Smoking status: Current Every Day Smoker    Packs/day: 1.00    Years: 45.00    Pack years: 45.00    Types: Cigarettes  . Smokeless tobacco: Never Used  . Tobacco comment: 3 per day  Substance and Sexual Activity  . Alcohol use: No    Alcohol/week: 0.0 standard drinks  . Drug use: No  . Sexual activity: Yes    Partners: Male    Comment: pt. down to 3 cigarettes a day  Other Topics Concern  . Not on file  Social History Narrative  . Not on file   Social Determinants of Health   Financial Resource Strain:   . Difficulty of Paying Living Expenses: Not on file  Food Insecurity:   . Worried About Programme researcher, broadcasting/film/video in the Last Year: Not on file  . Ran Out of Food in the Last Year: Not on file  Transportation Needs:   . Lack of Transportation (Medical): Not on file  . Lack of Transportation (Non-Medical): Not on file  Physical Activity:   . Days of Exercise per Week: Not on file  . Minutes of Exercise per Session: Not on file  Stress:   . Feeling of Stress : Not on file  Social Connections:   . Frequency of Communication with Friends and Family: Not on file  . Frequency of Social Gatherings with Friends and Family: Not on file  . Attends Religious Services: Not on file  . Active Member of Clubs or Organizations: Not on file  . Attends Banker Meetings: Not on file  . Marital Status: Not on file  Intimate Partner Violence:   . Fear of Current or Ex-Partner: Not on file  . Emotionally Abused: Not on file  . Physically Abused: Not on file  . Sexually Abused: Not on file   Family History  Problem Relation Age of Onset  . Healthy Mother   . Healthy Father     OBJECTIVE:  Vitals:   02/27/20 1332  BP: (!) 149/81  Pulse: 98  Resp: 16  Temp: 98.4 F (36.9 C)  TempSrc: Oral  SpO2: 97%     General appearance: alert; well-appearing, nontoxic; speaking in full sentences and tolerating own  secretions HEENT: NCAT; Ears: EACs clear, TMs pearly gray; Eyes: PERRL.  EOM grossly intact. Sinuses: TTP over frontal and maxillary sinuses; Nose: nares patent without rhinorrhea, Throat: oropharynx clear, tonsils non erythematous or enlarged, uvula midline  Neck: supple without LAD Lungs: unlabored respirations, symmetrical air entry; cough: absent; no respiratory distress; CTAB Heart: regular rate and rhythm.  Skin: warm and dry Psychological: alert and cooperative; normal mood and affect  ASSESSMENT & PLAN:  1. Suspected COVID-19 virus infection   2. Viral sinusitis     Meds ordered this encounter  Medications  . dexamethasone (DECADRON) injection 10 mg  . amoxicillin-clavulanate (AUGMENTIN) 875-125 MG tablet    Sig: Take 1 tablet by mouth every 12 (twelve) hours for 10 days.    Dispense:  20 tablet    Refill:  0    PLEASE DO NOT FILL RX UNTIL 02/29/20 Thank you!    Order Specific Question:   Supervising Provider    Answer:   Eustace Moore [[id number]]    COVID test ordered.  Someone will contact you regarding abnormal results between 5-7 days otherwise you may call for your results  In the meantime: You should remain isolated in your home for 10 days from symptom onset AND greater than 72 hours after symptoms resolution (absence of fever without the use of fever-reducing medication and improvement in respiratory symptoms), whichever is longer Get plenty of rest and push fluids Steroid shot given in office per patient request Augmentin prescribed.  Prescription will be available at your pharmacy on 3/3 if symptoms are still persisting or not improving.   Use OTC medications like ibuprofen or tylenol as needed fever or pain Follow up with PCP in 1-2 days for recheck via phone or video to ensure symptoms are improving Call or go to the ED if you have any new or worsening symptoms such as fever, cough, shortness of breath, chest tightness, chest pain, turning blue, changes in  mental status, etc...   Reviewed expectations re: course of current medical issues. Questions answered. Outlined signs and symptoms indicating need for more acute intervention. Patient verbalized understanding. After Visit Summary given.         Rennis Harding, PA-C 02/27/20 1358

## 2020-02-28 LAB — NOVEL CORONAVIRUS, NAA: SARS-CoV-2, NAA: NOT DETECTED

## 2020-03-14 DIAGNOSIS — I739 Peripheral vascular disease, unspecified: Secondary | ICD-10-CM | POA: Diagnosis not present

## 2020-03-14 DIAGNOSIS — E782 Mixed hyperlipidemia: Secondary | ICD-10-CM | POA: Diagnosis not present

## 2020-03-14 DIAGNOSIS — E7849 Other hyperlipidemia: Secondary | ICD-10-CM | POA: Diagnosis not present

## 2020-03-21 DIAGNOSIS — Z1389 Encounter for screening for other disorder: Secondary | ICD-10-CM | POA: Diagnosis not present

## 2020-03-21 DIAGNOSIS — Z6825 Body mass index (BMI) 25.0-25.9, adult: Secondary | ICD-10-CM | POA: Diagnosis not present

## 2020-03-21 DIAGNOSIS — E663 Overweight: Secondary | ICD-10-CM | POA: Diagnosis not present

## 2020-03-21 DIAGNOSIS — Z0000 Encounter for general adult medical examination without abnormal findings: Secondary | ICD-10-CM | POA: Diagnosis not present

## 2020-03-26 ENCOUNTER — Encounter: Payer: Self-pay | Admitting: Emergency Medicine

## 2020-03-26 ENCOUNTER — Ambulatory Visit
Admission: EM | Admit: 2020-03-26 | Discharge: 2020-03-26 | Disposition: A | Discharge status: Home / Self Care | Payer: PPO | Attending: Emergency Medicine | Admitting: Emergency Medicine

## 2020-03-26 ENCOUNTER — Other Ambulatory Visit: Payer: Self-pay

## 2020-03-26 DIAGNOSIS — R300 Dysuria: Secondary | ICD-10-CM | POA: Insufficient documentation

## 2020-03-26 DIAGNOSIS — R350 Frequency of micturition: Secondary | ICD-10-CM | POA: Diagnosis not present

## 2020-03-26 LAB — POCT URINALYSIS DIP (MANUAL ENTRY)
Bilirubin, UA: NEGATIVE
Glucose, UA: NEGATIVE mg/dL
Ketones, POC UA: NEGATIVE mg/dL
Leukocytes, UA: NEGATIVE
Nitrite, UA: NEGATIVE
Protein Ur, POC: NEGATIVE mg/dL
Spec Grav, UA: 1.01 (ref 1.010–1.025)
Urobilinogen, UA: 0.2 E.U./dL
pH, UA: 5.5 (ref 5.0–8.0)

## 2020-03-26 MED ORDER — SULFAMETHOXAZOLE-TRIMETHOPRIM 400-80 MG PO TABS: 2.0000 | ORAL_TABLET | Freq: Two times a day (BID) | ORAL | 0 refills | Status: AC

## 2020-03-26 NOTE — ED Provider Notes (Signed)
[facility]   CC: Burning with urination  SUBJECTIVE:  [name] is a 69 y.o. female who complains of frequent urination, and "cold chills" at end of urine stream x 1 week.  States symptoms began after finishing antibiotic for sinus infection.  Denies alleviating factors.  Symptoms are made worse with urination.  Admits to similar symptoms in the past.  Denies fever, chills, nausea, vomiting, abdominal pain, flank pain.    LMP: No LMP recorded. Patient has had a hysterectomy.  ROS: As in HPI.  All other pertinent ROS negative.     Past Medical History:  Diagnosis Date  . GERD (gastroesophageal reflux disease)   . Hyperlipidemia   . Multiple allergies   . Peripheral vascular disease (HCC)    see 12/03/15- US arterial- shows borderline PAD   . Pneumonia    hx of 2016    Past Surgical History:  Procedure Laterality Date  . ABDOMINAL AORTOGRAM W/LOWER EXTREMITY N/A 09/23/2017   Procedure: ABDOMINAL AORTOGRAM W/LOWER EXTREMITY;  Surgeon: Maeola Harman, MD;  Location: [facility] INVASIVE CV LAB;  Service: Cardiovascular;  Laterality: N/A;  . ABDOMINAL HYSTERECTOMY    . CHOLECYSTECTOMY    . ENDARTERECTOMY FEMORAL Left 04/30/2016   Procedure: Endarterectomy of Left popliteal, anterior tibial and tibio-peroneal trunk;  Surgeon: Pryor Ochoa, MD;  Location: [facility] OR;  Service: Vascular;  Laterality: Left;  . FEMORAL-POPLITEAL BYPASS GRAFT Left 04/30/2016   Procedure: Left Distal Superfical Femoral to Tibio-peroneal trunk bypass using non-reversed saphenous vein graph Left Leg;  Surgeon: Pryor Ochoa, MD;  Location: [facility] OR;  Service: Vascular;  Laterality: Left;  . INTRAOPERATIVE ARTERIOGRAM Left 04/30/2016   Procedure: Left Leg INTRA OPERATIVE ARTERIOGRAM;  Surgeon: Pryor Ochoa, MD;  Location: [facility] OR;  Service: Vascular;  Laterality: Left;  . LUMBAR LAMINECTOMY/DECOMPRESSION MICRODISCECTOMY Left 12/26/2015   Procedure: LUMBAR LAMINECTOMY/DECOMPRESSION MICRODISCECTOMY 1 LEVEL  L4-5 ON LEFT ;  Surgeon: Jene Every, MD;  Location: [facility];  Service: Orthopedics;  Laterality: Left;  . PERIPHERAL VASCULAR CATHETERIZATION N/A 03/25/2016   Procedure: Abdominal Aortogram w/Lower Extremity;  Surgeon: Nada Libman, MD;  Location: [facility] INVASIVE CV LAB;  Service: Cardiovascular;  Laterality: N/A;  . PERIPHERAL VASCULAR CATHETERIZATION N/A 08/20/2016   Procedure: Abdominal Aortogram w/Lower Extremity;  Surgeon: Maeola Harman, MD;  Location: [facility] INVASIVE CV LAB;  Service: Cardiovascular;  Laterality: N/A;  . PERIPHERAL VASCULAR CATHETERIZATION Left 08/20/2016   Procedure: Peripheral Vascular Balloon Angioplasty;  Surgeon: Maeola Harman, MD;  Location: [facility] INVASIVE CV LAB;  Service: Cardiovascular;  Laterality: Left;  Superficial femoral  . right ovary removal      Allergies  Allergen Reactions  . Clarithromycin Other (See Comments)    Makes her nervous  . Levofloxacin Other (See Comments)    nervousness  . Red Dye Hives and Itching   No current facility-administered medications on file prior to encounter.   Current Outpatient Medications on File Prior to Encounter  Medication Sig Dispense Refill  . acetaminophen (TYLENOL) 325 MG tablet Take 650 mg by mouth every 6 (six) hours as needed for mild pain or moderate pain.    Marland Kitchen aspirin EC 81 MG tablet Take 81 mg by mouth daily.    Marland Kitchen levocetirizine (XYZAL) 5 MG tablet Take 5 mg by mouth every evening.     . montelukast (SINGULAIR) 10 MG tablet Take 10 mg by mouth at bedtime.    . pantoprazole (PROTONIX) 40 MG tablet Take 40 mg by mouth daily before  breakfast.      Social History   Socioeconomic History  . Marital status: Married    Spouse name: Not on file  . Number of children: Not on file  . Years of education: Not on file  . Highest education level: Not on file  Occupational History  . Not on file  Tobacco Use  . Smoking status: Current Every Day Smoker    Packs/day: 1.00    Years: 45.00    Pack  years: 45.00    Types: Cigarettes  . Smokeless tobacco: Never Used  . Tobacco comment: 3 per day  Substance and Sexual Activity  . Alcohol use: No    Alcohol/week: 0.0 standard drinks  . Drug use: No  . Sexual activity: Yes    Partners: Male    Comment: pt. down to 3 cigarettes a day  Other Topics Concern  . Not on file  Social History Narrative  . Not on file   Social Determinants of Health   Financial Resource Strain:   . Difficulty of Paying Living Expenses:   Food Insecurity:   . Worried About Programme researcher, broadcasting/film/video in the Last Year:   . Barista in the Last Year:   Transportation Needs:   . Freight forwarder (Medical):   Marland Kitchen Lack of Transportation (Non-Medical):   Physical Activity:   . Days of Exercise per Week:   . Minutes of Exercise per Session:   Stress:   . Feeling of Stress :   Social Connections:   . Frequency of Communication with Friends and Family:   . Frequency of Social Gatherings with Friends and Family:   . Attends Religious Services:   . Active Member of Clubs or Organizations:   . Attends Banker Meetings:   Marland Kitchen Marital Status:   Intimate Partner Violence:   . Fear of Current or Ex-Partner:   . Emotionally Abused:   Marland Kitchen Physically Abused:   . Sexually Abused:    Family History  Problem Relation Age of Onset  . Healthy Mother   . Healthy Father     OBJECTIVE:  Vitals:   03/26/20 1404  BP: (!) 162/69  Pulse: 87  Resp: 17  Temp: 97.7 F (36.5 C)  TempSrc: Oral  SpO2: 96%   General appearance: Alert HENT: NCAT; PERRL, EOMI grossly; oropharynx clear Lungs: clear to auscultation bilaterally without adventitious breath sounds Heart: regular rate and rhythm.   Abdomen: soft; non-distended; no tenderness; bowel sounds present; no guarding Back: no CVA tenderness Extremities: no edema; symmetrical with no gross deformities Skin: warm and dry Neurologic: Ambulates from chair to exam table without  difficulty Psychological: alert and cooperative; normal mood and affect  Labs Reviewed  POCT URINALYSIS DIP (MANUAL ENTRY) - Abnormal; Notable for the following components:      Result Value   Blood, UA trace-intact (*)    All other components within normal limits  URINE CULTURE    ASSESSMENT & PLAN:  1. Urinary frequency   2. Dysuria     Meds ordered this encounter  Medications  . sulfamethoxazole-trimethoprim (BACTRIM) 400-80 MG tablet    Sig: Take 2 tablets by mouth 2 (two) times daily for 3 days.    Dispense:  12 tablet    Refill:  0    Order Specific Question:   Supervising Provider    Answer:   Eustace Moore [[id number]]   Urine showed possible UTI Urine culture sent.  We will call you  with the results.   Push fluids and get plenty of rest.   Take antibiotic as directed and to completion Follow up with PCP if symptoms persists Return here or go to ER if you have any new or worsening symptoms such as fever, worsening abdominal pain, nausea/vomiting, flank pain, etc...  Outlined signs and symptoms indicating need for more acute intervention. Patient verbalized understanding. After Visit Summary given.     Lestine Box, PA-C 03/26/20 1436

## 2020-03-26 NOTE — Discharge Instructions (Signed)
Urine showed possible UTI Urine culture sent.  We will call you with the results.   Push fluids and get plenty of rest.   Take antibiotic as directed and to completion Follow up with PCP if symptoms persists Return here or go to ER if you have any new or worsening symptoms such as fever, worsening abdominal pain, nausea/vomiting, flank pain, etc..Marland Kitchen

## 2020-03-26 NOTE — ED Triage Notes (Signed)
Patient reports she has had symptoms for over a week.  Patient reports she gets these symptoms after finishing an antibiotic.  Patient was seen and treated sinus infection 3/1.    Patient reports frequent urination for one week.  Denies back pain, denies stomach pain

## 2020-03-27 LAB — URINE CULTURE
Culture: <10000 — AB
Special Requests: NORMAL

## 2020-03-28 DIAGNOSIS — E7849 Other hyperlipidemia: Secondary | ICD-10-CM | POA: Diagnosis not present

## 2020-03-28 DIAGNOSIS — J449 Chronic obstructive pulmonary disease, unspecified: Secondary | ICD-10-CM | POA: Diagnosis not present

## 2020-03-28 DIAGNOSIS — K219 Gastro-esophageal reflux disease without esophagitis: Secondary | ICD-10-CM | POA: Diagnosis not present

## 2020-03-28 DIAGNOSIS — Z720 Tobacco use: Secondary | ICD-10-CM | POA: Diagnosis not present

## 2020-04-01 DIAGNOSIS — Z1211 Encounter for screening for malignant neoplasm of colon: Secondary | ICD-10-CM | POA: Diagnosis not present

## 2020-05-24 ENCOUNTER — Encounter: Payer: Self-pay | Admitting: Emergency Medicine

## 2020-05-24 ENCOUNTER — Ambulatory Visit
Admission: EM | Admit: 2020-05-24 | Discharge: 2020-05-24 | Disposition: A | Discharge status: Home / Self Care | Payer: PPO | Attending: Emergency Medicine | Admitting: Emergency Medicine

## 2020-05-24 ENCOUNTER — Other Ambulatory Visit: Payer: Self-pay

## 2020-05-24 DIAGNOSIS — J0100 Acute maxillary sinusitis, unspecified: Secondary | ICD-10-CM | POA: Diagnosis not present

## 2020-05-24 MED ORDER — PREDNISONE 10 MG PO TABS: 20.0000 mg | ORAL_TABLET | Freq: Every day | ORAL | 0 refills | Status: AC

## 2020-05-24 MED ORDER — AMOXICILLIN-POT CLAVULANATE 875-125 MG PO TABS: 1.0000 | ORAL_TABLET | Freq: Two times a day (BID) | ORAL | 0 refills | Status: DC

## 2020-05-24 MED ORDER — METHYLPREDNISOLONE SODIUM SUCC 125 MG IJ SOLR
80.0000 mg | Freq: Once | INTRAMUSCULAR | Status: AC
  Administered 2020-05-24: 80 mg via INTRAMUSCULAR

## 2020-05-24 NOTE — ED Provider Notes (Signed)
[facility]    CSN: [id number] Arrival date & time: 05/24/20  1036      History   Chief Complaint Chief Complaint  Patient presents with  . Facial Pain    HPI [name] is a 69 y.o. female.   Who presented to the urgent care for complaint of sinus pressure and sinus pain for the past 10 days.  Report brown discharge while blowing her nose.  Has been using Sudafed, Mucinex and Flonase without relief.  Denies sick exposure to COVID, flu or strep.  Reports similar symptoms in the past and improved with steroid and antibiotic.  Denies aggravating or alleviating symptoms.  Denies previous COVID infection.   Denies fever, chills, fatigue, nasal congestion, rhinorrhea, sore throat, cough, SOB, wheezing, chest pain, nausea, vomiting, changes in bowel or bladder habits.    The history is provided by the patient. No language interpreter was used.    Past Medical History:  Diagnosis Date  . GERD (gastroesophageal reflux disease)   . Hyperlipidemia   . Multiple allergies   . Peripheral vascular disease (HCC)    see 12/03/15- US arterial- shows borderline PAD   . Pneumonia    hx of 2016     Patient Active Problem List   Diagnosis Date Noted  . PAD (peripheral artery disease) (HCC) 03/11/2016  . Spinal stenosis of lumbar region 12/26/2015  . Fever 06/05/2011    Past Surgical History:  Procedure Laterality Date  . ABDOMINAL AORTOGRAM W/LOWER EXTREMITY N/A 09/23/2017   Procedure: ABDOMINAL AORTOGRAM W/LOWER EXTREMITY;  Surgeon: Maeola Harman, MD;  Location: [facility] INVASIVE CV LAB;  Service: Cardiovascular;  Laterality: N/A;  . ABDOMINAL HYSTERECTOMY    . CHOLECYSTECTOMY    . ENDARTERECTOMY FEMORAL Left 04/30/2016   Procedure: Endarterectomy of Left popliteal, anterior tibial and tibio-peroneal trunk;  Surgeon: Pryor Ochoa, MD;  Location: [facility] OR;  Service: Vascular;  Laterality: Left;  . FEMORAL-POPLITEAL BYPASS GRAFT Left 04/30/2016   Procedure: Left Distal  Superfical Femoral to Tibio-peroneal trunk bypass using non-reversed saphenous vein graph Left Leg;  Surgeon: Pryor Ochoa, MD;  Location: [facility] OR;  Service: Vascular;  Laterality: Left;  . INTRAOPERATIVE ARTERIOGRAM Left 04/30/2016   Procedure: Left Leg INTRA OPERATIVE ARTERIOGRAM;  Surgeon: Pryor Ochoa, MD;  Location: [facility] OR;  Service: Vascular;  Laterality: Left;  . LUMBAR LAMINECTOMY/DECOMPRESSION MICRODISCECTOMY Left 12/26/2015   Procedure: LUMBAR LAMINECTOMY/DECOMPRESSION MICRODISCECTOMY 1 LEVEL L4-5 ON LEFT ;  Surgeon: Jene Every, MD;  Location: [facility] ORS;  Service: Orthopedics;  Laterality: Left;  . PERIPHERAL VASCULAR CATHETERIZATION N/A 03/25/2016   Procedure: Abdominal Aortogram w/Lower Extremity;  Surgeon: Nada Libman, MD;  Location: [facility] INVASIVE CV LAB;  Service: Cardiovascular;  Laterality: N/A;  . PERIPHERAL VASCULAR CATHETERIZATION N/A 08/20/2016   Procedure: Abdominal Aortogram w/Lower Extremity;  Surgeon: Maeola Harman, MD;  Location: [facility] INVASIVE CV LAB;  Service: Cardiovascular;  Laterality: N/A;  . PERIPHERAL VASCULAR CATHETERIZATION Left 08/20/2016   Procedure: Peripheral Vascular Balloon Angioplasty;  Surgeon: Maeola Harman, MD;  Location: [facility] INVASIVE CV LAB;  Service: Cardiovascular;  Laterality: Left;  Superficial femoral  . right ovary removal       OB History   No obstetric history on file.      Home Medications    Prior to Admission medications   Medication Sig Start Date End Date Taking? Authorizing Provider  acetaminophen (TYLENOL) 325 MG tablet Take 650 mg by mouth every 6 (six) hours as needed for mild pain  or moderate pain.    [provider]  amoxicillin-clavulanate (AUGMENTIN) 875-125 MG tablet Take 1 tablet by mouth every 12 (twelve) hours. 05/24/20   Raynie Steinhaus, Zachery Dakins, FNP  aspirin EC 81 MG tablet Take 81 mg by mouth daily.    [provider]  levocetirizine (XYZAL) 5 MG tablet Take 5 mg by mouth every evening.      [provider]  montelukast (SINGULAIR) 10 MG tablet Take 10 mg by mouth at bedtime.    [provider]  pantoprazole (PROTONIX) 40 MG tablet Take 40 mg by mouth daily before breakfast.     [provider]  predniSONE (DELTASONE) 10 MG tablet Take 2 tablets (20 mg total) by mouth daily for 5 days. 05/24/20 05/29/20  Durward Parcel, FNP    Family History Family History  Problem Relation Age of Onset  . Healthy Mother   . Healthy Father     Social History Social History   Tobacco Use  . Smoking status: Current Every Day Smoker    Packs/day: 1.00    Years: 45.00    Pack years: 45.00    Types: Cigarettes  . Smokeless tobacco: Never Used  . Tobacco comment: 3 per day  Substance Use Topics  . Alcohol use: No    Alcohol/week: 0.0 standard drinks  . Drug use: No     Allergies   Clarithromycin, Levofloxacin, and Red dye   Review of Systems Review of Systems  Constitutional: Negative.   HENT: Positive for sinus pressure and sinus pain.   Respiratory: Negative.   Cardiovascular: Negative.   Gastrointestinal: Negative.   All other systems reviewed and are negative.    Physical Exam Triage Vital Signs ED Triage Vitals [05/24/20 1046]  Enc Vitals Group     BP 122/79     Pulse Rate 97     Resp 18     Temp 98.2 F (36.8 C)     Temp Source Oral     SpO2 95 %     Weight 142 lb (64.4 kg)     Height 5\' 3"  (1.6 m)     Head Circumference      Peak Flow      Pain Score 0     Pain Loc      Pain Edu?      Excl. in GC?    No data found.  Updated Vital Signs BP 122/79 (BP Location: Right Arm)   Pulse 97   Temp 98.2 F (36.8 C) (Oral)   Resp 18   Ht 5\' 3"  (1.6 m)   Wt 142 lb (64.4 kg)   SpO2 95%   BMI 25.15 kg/m   Visual Acuity Right Eye Distance:   Left Eye Distance:   Bilateral Distance:    Right Eye Near:   Left Eye Near:    Bilateral Near:     Physical Exam Vitals and nursing note reviewed.  Constitutional:      General:  She is not in acute distress.    Appearance: Normal appearance. She is normal weight. She is not ill-appearing, toxic-appearing or diaphoretic.  HENT:     Head: Normocephalic and atraumatic.     Right Ear: Tympanic membrane, ear canal and external ear normal. There is no impacted cerumen.     Left Ear: Ear canal and external ear normal. A middle ear effusion is present. There is no impacted cerumen.     Nose: Congestion present. No rhinorrhea.  Right Sinus: Maxillary sinus tenderness present.     Left Sinus: Maxillary sinus tenderness present.     Mouth/Throat:     Mouth: Mucous membranes are moist.     Pharynx: Oropharynx is clear. No oropharyngeal exudate or posterior oropharyngeal erythema.  Cardiovascular:     Rate and Rhythm: Normal rate and regular rhythm.     Pulses: Normal pulses.     Heart sounds: Normal heart sounds. No murmur. No friction rub. No gallop.   Pulmonary:     Effort: Pulmonary effort is normal. No respiratory distress.     Breath sounds: Normal breath sounds. No stridor. No wheezing, rhonchi or rales.  Chest:     Chest wall: No tenderness.  Neurological:     Mental Status: She is alert.      UC Treatments / Results  Labs (all labs ordered are listed, but only abnormal results are displayed) Labs Reviewed - No data to display  EKG   Radiology No results found.  Procedures Procedures (including critical care time)  Medications Ordered in UC Medications  methylPREDNISolone sodium succinate (SOLU-MEDROL) 125 mg/2 mL injection 80 mg (has no administration in time range)    Initial Impression / Assessment and Plan / UC Course  I have reviewed the triage vital signs and the nursing notes.  Pertinent labs & imaging results that were available during my care of the patient were reviewed by me and considered in my medical decision making (see chart for details).     Her hx, symptoms, and PE are consistent with sinusitis.   Prescribed augmentin and  short-term prednisone.  Will follow up with PCP if symptoms persists.  Return and ER precautions given.    Final Clinical Impressions(s) / UC Diagnoses   Final diagnoses:  Acute non-recurrent maxillary sinusitis     Discharge Instructions     Rest and push fluids Continue to take Flonase and Sudafed as prescribed  Augmentin prescribed.  Take as directed and to completion Short-term prednisone was prescribed Continue with OTC ibuprofen/tylenol as needed for pain Follow up with PCP or [facility] if symptoms persists Return or go to the ED if you have any new or worsening symptoms such as fever, chills, worsening sinus pain/pressure, cough, sore throat, chest pain, shortness of breath, abdominal pain, changes in bowel or bladder habits, etc...     ED Prescriptions    Medication Sig Dispense Auth. Provider   amoxicillin-clavulanate (AUGMENTIN) 875-125 MG tablet Take 1 tablet by mouth every 12 (twelve) hours. 14 tablet Carly Sabo S, FNP   predniSONE (DELTASONE) 10 MG tablet Take 2 tablets (20 mg total) by mouth daily for 5 days. 10 tablet Latasha Puskas, Darrelyn Hillock, FNP     PDMP not reviewed this encounter.   Emerson Monte, FNP 05/24/20 1102

## 2020-05-24 NOTE — ED Triage Notes (Signed)
Sinus congestion and pain x 1 1/2 weeks.  Pt has tried over medication with no relief.

## 2020-05-24 NOTE — Discharge Instructions (Addendum)
Rest and push fluids Continue to take Flonase and Sudafed as prescribed  Augmentin prescribed.  Take as directed and to completion Short-term prednisone was prescribed Continue with OTC ibuprofen/tylenol as needed for pain Follow up with PCP or [facility] if symptoms persists Return or go to the ED if you have any new or worsening symptoms such as fever, chills, worsening sinus pain/pressure, cough, sore throat, chest pain, shortness of breath, abdominal pain, changes in bowel or bladder habits, etc..Marland Kitchen

## 2020-06-28 DIAGNOSIS — M5416 Radiculopathy, lumbar region: Secondary | ICD-10-CM | POA: Diagnosis not present

## 2020-06-28 DIAGNOSIS — G894 Chronic pain syndrome: Secondary | ICD-10-CM | POA: Diagnosis not present

## 2020-06-28 DIAGNOSIS — Z5181 Encounter for therapeutic drug level monitoring: Secondary | ICD-10-CM | POA: Diagnosis not present

## 2020-06-28 DIAGNOSIS — Z79899 Other long term (current) drug therapy: Secondary | ICD-10-CM | POA: Diagnosis not present

## 2020-08-06 DIAGNOSIS — Z6825 Body mass index (BMI) 25.0-25.9, adult: Secondary | ICD-10-CM | POA: Diagnosis not present

## 2020-08-06 DIAGNOSIS — E663 Overweight: Secondary | ICD-10-CM | POA: Diagnosis not present

## 2020-08-06 DIAGNOSIS — J019 Acute sinusitis, unspecified: Secondary | ICD-10-CM | POA: Diagnosis not present

## 2020-08-06 DIAGNOSIS — J301 Allergic rhinitis due to pollen: Secondary | ICD-10-CM | POA: Diagnosis not present

## 2020-09-14 DIAGNOSIS — Z299 Encounter for prophylactic measures, unspecified: Secondary | ICD-10-CM | POA: Diagnosis not present

## 2020-09-14 DIAGNOSIS — J0100 Acute maxillary sinusitis, unspecified: Secondary | ICD-10-CM | POA: Diagnosis not present

## 2020-09-27 DIAGNOSIS — K219 Gastro-esophageal reflux disease without esophagitis: Secondary | ICD-10-CM | POA: Diagnosis not present

## 2020-09-27 DIAGNOSIS — E7849 Other hyperlipidemia: Secondary | ICD-10-CM | POA: Diagnosis not present

## 2020-09-27 DIAGNOSIS — Z720 Tobacco use: Secondary | ICD-10-CM | POA: Diagnosis not present

## 2020-09-27 DIAGNOSIS — J449 Chronic obstructive pulmonary disease, unspecified: Secondary | ICD-10-CM | POA: Diagnosis not present

## 2020-10-04 DIAGNOSIS — Z6825 Body mass index (BMI) 25.0-25.9, adult: Secondary | ICD-10-CM | POA: Diagnosis not present

## 2020-10-04 DIAGNOSIS — J019 Acute sinusitis, unspecified: Secondary | ICD-10-CM | POA: Diagnosis not present

## 2020-10-04 DIAGNOSIS — E663 Overweight: Secondary | ICD-10-CM | POA: Diagnosis not present

## 2020-11-28 DIAGNOSIS — R0789 Other chest pain: Secondary | ICD-10-CM | POA: Diagnosis not present

## 2020-11-28 DIAGNOSIS — J019 Acute sinusitis, unspecified: Secondary | ICD-10-CM | POA: Diagnosis not present

## 2020-12-07 DIAGNOSIS — G894 Chronic pain syndrome: Secondary | ICD-10-CM | POA: Diagnosis not present

## 2021-01-24 DIAGNOSIS — F17290 Nicotine dependence, other tobacco product, uncomplicated: Secondary | ICD-10-CM | POA: Diagnosis not present

## 2021-01-24 DIAGNOSIS — J3089 Other allergic rhinitis: Secondary | ICD-10-CM | POA: Diagnosis not present

## 2021-01-24 DIAGNOSIS — R011 Cardiac murmur, unspecified: Secondary | ICD-10-CM | POA: Diagnosis not present

## 2021-01-24 DIAGNOSIS — Z681 Body mass index (BMI) 19 or less, adult: Secondary | ICD-10-CM | POA: Diagnosis not present

## 2021-03-25 DIAGNOSIS — M5416 Radiculopathy, lumbar region: Secondary | ICD-10-CM | POA: Diagnosis not present

## 2021-03-26 ENCOUNTER — Encounter: Payer: Self-pay | Admitting: Cardiology

## 2021-03-26 NOTE — Progress Notes (Signed)
Cardiology Office Note  Date: 03/27/2021   ID: Erin, Patel 05/20/1951, MRN [id number]  PCP:  Elfredia Nevins, MD  Cardiologist:  Nona Dell, MD Electrophysiologist:  None   Chief Complaint  Patient presents with  . Chest discomfort    History of Present Illness: [name] is a 70 y.o. female referred for cardiology consultation by Ms. Jean Rosenthal PA-C for evaluation of chest discomfort.  She states that about a month and a half ago she was having trouble with sinus infections.  During that time she experienced a recurring left arm discomfort and chest pain described as a "sharp" sensation.  Not necessarily provoked by activity.  She tells her that she was prescribed nitroglycerin by her PCP and only took it once.  Since then symptoms have resolved.  She has a history of longstanding tobacco abuse and PAD as discussed below.  Has followed with [facility].  She was also told that she has a heart murmur, this has not been recently investigated by echocardiogram.  I reviewed her medications which are outlined below.  I personally reviewed her ECG today which shows normal sinus rhythm.  We discussed smoking cessation today.  She states that she has cut back from 3 packs/day to 1 pack/day and does plan to pick a quit date. I also talked to her about nicotine replacement therapy when she stop smoking.  Past Medical History:  Diagnosis Date  . Allergic rhinitis   . GERD (gastroesophageal reflux disease)   . Heart murmur   . History of pneumonia   . Hyperlipidemia   . PAD (peripheral artery disease) (HCC) 2017   Status post left distal superfical femoral to tibio-peroneal trunk bypass - Dr Hart [place]    Past Surgical History:  Procedure Laterality Date  . ABDOMINAL AORTOGRAM W/LOWER EXTREMITY N/A 09/23/2017   Procedure: ABDOMINAL AORTOGRAM W/LOWER EXTREMITY;  Surgeon: Maeola Harman, MD;  Location: [facility] INVASIVE CV LAB;  Service: Cardiovascular;  Laterality: N/A;  .  ABDOMINAL HYSTERECTOMY    . CHOLECYSTECTOMY    . ENDARTERECTOMY FEMORAL Left 04/30/2016   Procedure: Endarterectomy of Left popliteal, anterior tibial and tibio-peroneal trunk;  Surgeon: Pryor Ochoa, MD;  Location: [facility] OR;  Service: Vascular;  Laterality: Left;  . FEMORAL-POPLITEAL BYPASS GRAFT Left 04/30/2016   Procedure: Left Distal Superfical Femoral to Tibio-peroneal trunk bypass using non-reversed saphenous vein graph Left Leg;  Surgeon: Pryor Ochoa, MD;  Location: [place] OR;  Service: Vascular;  Laterality: Left;  . INTRAOPERATIVE ARTERIOGRAM Left 04/30/2016   Procedure: Left Leg INTRA OPERATIVE ARTERIOGRAM;  Surgeon: Pryor Ochoa, MD;  Location: [facility] OR;  Service: Vascular;  Laterality: Left;  . LUMBAR LAMINECTOMY/DECOMPRESSION MICRODISCECTOMY Left 12/26/2015   Procedure: LUMBAR LAMINECTOMY/DECOMPRESSION MICRODISCECTOMY 1 LEVEL L4-5 ON LEFT ;  Surgeon: Jene Every, MD;  Location: [facility] ORS;  Service: Orthopedics;  Laterality: Left;  . OOPHORECTOMY Right   . PERIPHERAL VASCULAR CATHETERIZATION N/A 03/25/2016   Procedure: Abdominal Aortogram w/Lower Extremity;  Surgeon: Nada Libman, MD;  Location: [facility] INVASIVE CV LAB;  Service: Cardiovascular;  Laterality: N/A;  . PERIPHERAL VASCULAR CATHETERIZATION N/A 08/20/2016   Procedure: Abdominal Aortogram w/Lower Extremity;  Surgeon: Maeola Harman, MD;  Location: [facility] INVASIVE CV LAB;  Service: Cardiovascular;  Laterality: N/A;  . PERIPHERAL VASCULAR CATHETERIZATION Left 08/20/2016   Procedure: Peripheral Vascular Balloon Angioplasty;  Surgeon: Maeola Harman, MD;  Location: [facility] INVASIVE CV LAB;  Service: Cardiovascular;  Laterality: Left;  Superficial femoral    Current Outpatient Medications  Medication Sig Dispense Refill  . acetaminophen (TYLENOL) 325 MG tablet Take 650 mg by mouth every 6 (six) hours as needed for mild pain or moderate pain.    Marland Kitchen aspirin EC 81 MG tablet Take 81 mg by mouth daily.    Marland Kitchen levocetirizine (XYZAL) 5 MG  tablet Take 5 mg by mouth every evening.     . montelukast (SINGULAIR) 10 MG tablet Take 10 mg by mouth at bedtime.    . pantoprazole (PROTONIX) 40 MG tablet Take 40 mg by mouth daily before breakfast.     . pravastatin (PRAVACHOL) 40 MG tablet Take 40 mg by mouth daily.     No current facility-administered medications for this visit.   Allergies:  Clarithromycin, Levofloxacin, and Red dye   Social History: The patient  reports that she has been smoking cigarettes. She has a 45.00 pack-year smoking history. She has never used smokeless tobacco. She reports that she does not drink alcohol and does not use drugs.   Family History: The patient's family history includes CAD in her father; Heart attack in her father; Other in her mother.   ROS: No palpitations or syncope.  Physical Exam: VS:  BP 134/82   Pulse 88   Ht 5\' 3"  (1.6 m)   Wt 146 lb (66.2 kg)   SpO2 97%   BMI 25.86 kg/m , BMI Body mass index is 25.86 kg/m.  Wt Readings from Last 3 Encounters:  03/27/21 146 lb (66.2 kg)  05/24/20 142 lb (64.4 kg)  03/12/18 151 lb 14.4 oz (68.9 kg)    General: Patient appears comfortable at rest. HEENT: Conjunctiva and lids normal, wearing a mask. Neck: Supple, no elevated JVP or carotid bruits, no thyromegaly. Lungs: Clear to auscultation, nonlabored breathing at rest. Cardiac: Regular rate and rhythm, no S3, 2-3/6 systolic murmur, no pericardial rub. Abdomen: Soft, nontender, bowel sounds present. Extremities: No pitting edema, no distal ulcerations. Skin: Warm and dry. Musculoskeletal: No kyphosis. Neuropsychiatric: Alert and oriented x3, affect grossly appropriate.  ECG:  An ECG dated 09/23/2017 was personally reviewed today and demonstrated:  Sinus rhythm with possible left atrial enlargement.  Recent Labwork:  No recent lab work for review today.  Other Studies Reviewed Today:  ABIs 03/12/2018: Final Interpretation:  Right: Resting right ankle-brachial index indicates mild  right lower  extremity arterial disease. The right toe-brachial index is normal. RT  great toe pressure = 101 mmHg.  Left: Resting left ankle-brachial index is within normal range. No  evidence of significant left lower extremity arterial disease. The left  toe-brachial index is normal. LT Great toe pressure = 109 mmHg.   Lower extremity arterial Dopplers 03/12/2018: Final Interpretation:   Left Graft(s): Patent left superficial femoral artery to tibio-peroneal  trunk bypass graft with no evidence of restenosis.   Assessment and Plan:  1.  History of left arm and chest discomfort as outlined above in a 70 year old woman with longstanding history of tobacco abuse, PAD, and hyperlipidemia.  ECG reviewed.  Plan is to proceed with ischemic evaluation via Lexiscan Myoview.  Avoiding treadmill with PAD and claudication.   2.  Tobacco abuse. The patient was counseled on the dangers of tobacco use, and was advised to quit.  Reviewed strategies to maximize success, including pharmacotherapy (nicotine replacement after establishing appropriate quit date).  3.  Mixed hyperlipidemia, on Pravachol 40 mg daily.  She has followed with Dr. 66.  4.  Systolic murmur, aortic position, echocardiogram will be obtained for further evaluation.  5.  PAD status post left distal superficial femoral to tibio-peroneal trunk bypass in 2017.  She is interested in establishing with Dr. Arbie Cookey in the [facility] [place] office.  Medication Adjustments/Labs and Tests Ordered: Current medicines are reviewed at length with the patient today.  Concerns regarding medicines are outlined above.   Tests Ordered: Orders Placed This Encounter  Procedures  . NM Myocar Multi W/Spect W/Wall Motion / EF  . EKG 12-Lead  . ECHOCARDIOGRAM COMPLETE    Medication Changes: No orders of the defined types were placed in this encounter.   Disposition:  Follow up test results.  Signed, Jonelle Sidle, MD, [place] 03/27/2021 1:42 PM     [facility] [address] [place], [place] [address] Phone: [phone]; Fax: [phone]

## 2021-03-27 ENCOUNTER — Encounter: Payer: Self-pay | Admitting: Cardiology

## 2021-03-27 ENCOUNTER — Ambulatory Visit: Payer: PPO | Admitting: Cardiology

## 2021-03-27 ENCOUNTER — Other Ambulatory Visit: Payer: Self-pay

## 2021-03-27 ENCOUNTER — Encounter: Payer: Self-pay | Admitting: *Deleted

## 2021-03-27 VITALS — BP 134/82 | HR 88 | Ht 63.0 in | Wt 146.0 lb

## 2021-03-27 DIAGNOSIS — Z720 Tobacco use: Secondary | ICD-10-CM | POA: Diagnosis not present

## 2021-03-27 DIAGNOSIS — R079 Chest pain, unspecified: Secondary | ICD-10-CM

## 2021-03-27 DIAGNOSIS — Z87898 Personal history of other specified conditions: Secondary | ICD-10-CM | POA: Diagnosis not present

## 2021-03-27 DIAGNOSIS — R011 Cardiac murmur, unspecified: Secondary | ICD-10-CM

## 2021-03-27 DIAGNOSIS — I739 Peripheral vascular disease, unspecified: Secondary | ICD-10-CM

## 2021-03-27 DIAGNOSIS — E782 Mixed hyperlipidemia: Secondary | ICD-10-CM | POA: Diagnosis not present

## 2021-03-27 NOTE — Patient Instructions (Signed)
Medication Instructions:  Your physician recommends that you continue on your current medications as directed. Please refer to the Current Medication list given to you today.  *If you need a refill on your cardiac medications before your next appointment, please call your pharmacy*   Lab Work: NONE   If you have labs (blood work) drawn today and your tests are completely normal, you will receive your results only by: Marland Kitchen MyChart Message (if you have MyChart) OR . A paper copy in the mail If you have any lab test that is abnormal or we need to change your treatment, we will call you to review the results.   Testing/Procedures: Your physician has requested that you have an echocardiogram. Echocardiography is a painless test that uses sound waves to create images of your heart. It provides your doctor with information about the size and shape of your heart and how well your heart's chambers and valves are working. This procedure takes approximately one hour. There are no restrictions for this procedure.  Your physician has requested that you have a lexiscan myoview. For further information please visit [email or web address] Please follow instruction sheet, as given.   Follow-Up: At [facility], you and your health needs are our priority.  As part of our continuing mission to provide you with exceptional heart care, we have created designated Provider Care Teams.  These Care Teams include your primary Cardiologist (physician) and Advanced Practice Providers (APPs -  Physician Assistants and Nurse Practitioners) who all work together to provide you with the care you need, when you need it.  We recommend signing up for the patient portal called "MyChart".  Sign up information is provided on this After Visit Summary.  MyChart is used to connect with patients for Virtual Visits (Telemedicine).  Patients are able to view lab/test results, encounter notes, upcoming appointments, etc.  Non-urgent  messages can be sent to your provider as well.   To learn more about what you can do with MyChart, go to [email or web address].    Your next appointment:    Pending test results   The format for your next appointment:   In Person  Provider:   Nona Dell, MD   Other Instructions Thank you for choosing [facility]!  Dr. Arbie Cookey

## 2021-04-19 ENCOUNTER — Ambulatory Visit (HOSPITAL_COMMUNITY)
Admission: RE | Admit: 2021-04-19 | Discharge: 2021-04-19 | Disposition: A | Discharge status: Home / Self Care | Payer: PPO | Source: Ambulatory Visit | Attending: Cardiology | Admitting: Cardiology

## 2021-04-19 ENCOUNTER — Other Ambulatory Visit: Payer: Self-pay

## 2021-04-19 ENCOUNTER — Encounter (HOSPITAL_COMMUNITY)
Admission: RE | Admit: 2021-04-19 | Discharge: 2021-04-19 | Disposition: A | Discharge status: Home / Self Care | Payer: PPO | Source: Ambulatory Visit | Attending: Cardiology | Admitting: Cardiology

## 2021-04-19 ENCOUNTER — Encounter (HOSPITAL_COMMUNITY): Payer: Self-pay

## 2021-04-19 DIAGNOSIS — R011 Cardiac murmur, unspecified: Secondary | ICD-10-CM

## 2021-04-19 DIAGNOSIS — R079 Chest pain, unspecified: Secondary | ICD-10-CM | POA: Diagnosis not present

## 2021-04-19 LAB — NM MYOCAR MULTI W/SPECT W/WALL MOTION / EF
LV dias vol: 59 mL (ref 46–106)
LV sys vol: 18 mL
Peak HR: 103 {beats}/min
RATE: 0.35
Rest HR: 79 {beats}/min
SDS: 3
SRS: 1
SSS: 4
TID: 0.91

## 2021-04-19 LAB — ECHOCARDIOGRAM COMPLETE
Area-P 1/2: 3.19 cm2
S' Lateral: 2.3 cm

## 2021-04-19 MED ORDER — REGADENOSON 0.4 MG/5ML IV SOLN
INTRAVENOUS | Status: AC
  Administered 2021-04-19: 0.4 mg via INTRAVENOUS
  Filled 2021-04-19: qty 5

## 2021-04-19 MED ORDER — TECHNETIUM TC 99M TETROFOSMIN IV KIT
30.0000 | PACK | Freq: Once | INTRAVENOUS | Status: AC | PRN
  Administered 2021-04-19: 33 via INTRAVENOUS

## 2021-04-19 MED ORDER — SODIUM CHLORIDE FLUSH 0.9 % IV SOLN
INTRAVENOUS | Status: AC
  Administered 2021-04-19: 10 mL via INTRAVENOUS
  Filled 2021-04-19: qty 10

## 2021-04-19 MED ORDER — TECHNETIUM TC 99M TETROFOSMIN IV KIT
10.0000 | PACK | Freq: Once | INTRAVENOUS | Status: AC | PRN
  Administered 2021-04-19: 10.5 via INTRAVENOUS

## 2021-04-19 NOTE — Progress Notes (Signed)
*  PRELIMINARY RESULTS* Echocardiogram 2D Echocardiogram has been performed.  Stacey Drain 04/19/2021, 11:20 AM

## 2021-04-22 ENCOUNTER — Telehealth: Payer: Self-pay

## 2021-04-22 MED ORDER — METOPROLOL SUCCINATE ER 25 MG PO TB24: 12.5000 mg | ORAL_TABLET | Freq: Every day | ORAL | 3 refills | Status: DC

## 2021-04-22 NOTE — Telephone Encounter (Signed)
Patient returned call to [facility].

## 2021-04-22 NOTE — Telephone Encounter (Signed)
F/U Dr.McDowell 07/26/21 at 1020 am scheduled   Requested labs from pcp.    Toprol XL 12.5 mg e-scribed to [facility]   Left message for patient to return call.  I spoke with son, Leonette Most, and gave him test results and new medication instructions. I also gave him follow up apt information. He confirmed information given. PCP copied with test results   Jonelle Sidle, MD  Nori Riis, RN Thank you. Would start Toprol-XL 12.5 mg daily instead.

## 2021-04-22 NOTE — Telephone Encounter (Signed)
-----   Message from Jonelle Sidle, MD sent at 04/19/2021  4:47 PM EDT ----- Results reviewed.  Stress test does show an ischemic territory, possibly RCA distribution and LVEF is normal.  She reported resolution of symptoms at her recent visit, at this point would recommend medical medical therapy and follow-up.  She is already on aspirin and statin therapy.  Please add bisoprolol 5 mg daily and get her most recent lipid panel from PCP office.  Schedule a 35-month office visit.

## 2021-04-30 ENCOUNTER — Telehealth: Payer: Self-pay

## 2021-04-30 DIAGNOSIS — E782 Mixed hyperlipidemia: Secondary | ICD-10-CM

## 2021-04-30 NOTE — Telephone Encounter (Signed)
-----   Message from Jonelle Sidle, MD sent at 04/29/2021 12:18 PM EDT ----- Results reviewed.  Let's get a follow-up fasting lipid profile.  She has been on Pravachol per Dr. Sherwood Gambler, last LDL was 178.  In the setting of medical therapy for suspected ischemic heart disease based on recent Myoview, we should likely switch to a higher potency statin such as Crestor.  Let's see what her baseline numbers are now.

## 2021-04-30 NOTE — Telephone Encounter (Signed)
I spoke with patient.She states lab results are from a year ago. Lab for Lipids placed.Patient will be fasting tomorrow.

## 2021-05-01 ENCOUNTER — Telehealth: Payer: Self-pay

## 2021-05-01 ENCOUNTER — Other Ambulatory Visit (HOSPITAL_COMMUNITY)
Admission: RE | Admit: 2021-05-01 | Discharge: 2021-05-01 | Disposition: A | Discharge status: Home / Self Care | Payer: PPO | Source: Ambulatory Visit | Attending: Cardiology | Admitting: Cardiology

## 2021-05-01 ENCOUNTER — Other Ambulatory Visit: Payer: Self-pay

## 2021-05-01 DIAGNOSIS — E782 Mixed hyperlipidemia: Secondary | ICD-10-CM | POA: Insufficient documentation

## 2021-05-01 LAB — LIPID PANEL
Cholesterol: 208 mg/dL — ABNORMAL HIGH (ref 0–200)
HDL: 51 mg/dL (ref >40)
LDL Cholesterol: 141 mg/dL — ABNORMAL HIGH (ref 0–99)
Total CHOL/HDL Ratio: 4.1 RATIO
Triglycerides: 80 mg/dL (ref <150)
VLDL: 16 mg/dL (ref 0–40)

## 2021-05-01 MED ORDER — ROSUVASTATIN CALCIUM 20 MG PO TABS: 20.0000 mg | ORAL_TABLET | Freq: Every day | ORAL | 3 refills | Status: DC

## 2021-05-01 NOTE — Telephone Encounter (Signed)
-----   Message from Jonelle Sidle, MD sent at 05/01/2021 10:52 AM EDT ----- Results reviewed.  LDL 141, aiming for less than 70 in the setting of treatment for suspected ischemic heart disease.  Would have her stop Pravachol and switch to Crestor 20 mg daily if she is willing.

## 2021-05-01 NOTE — Telephone Encounter (Signed)
Pt was notified and voiced understanding/agreement. Medication changes are reflected in patients medication list. Pt had no questions or concerns at this time.

## 2021-05-22 DIAGNOSIS — J019 Acute sinusitis, unspecified: Secondary | ICD-10-CM | POA: Diagnosis not present

## 2021-05-22 DIAGNOSIS — Z681 Body mass index (BMI) 19 or less, adult: Secondary | ICD-10-CM | POA: Diagnosis not present

## 2021-05-22 DIAGNOSIS — J301 Allergic rhinitis due to pollen: Secondary | ICD-10-CM | POA: Diagnosis not present

## 2021-05-26 ENCOUNTER — Other Ambulatory Visit: Payer: Self-pay

## 2021-05-26 ENCOUNTER — Encounter: Payer: Self-pay | Admitting: Emergency Medicine

## 2021-05-26 ENCOUNTER — Ambulatory Visit
Admission: EM | Admit: 2021-05-26 | Discharge: 2021-05-26 | Disposition: A | Discharge status: Home / Self Care | Payer: PPO | Attending: Family Medicine | Admitting: Family Medicine

## 2021-05-26 DIAGNOSIS — J209 Acute bronchitis, unspecified: Secondary | ICD-10-CM

## 2021-05-26 DIAGNOSIS — J0140 Acute pansinusitis, unspecified: Secondary | ICD-10-CM

## 2021-05-26 MED ORDER — METHYLPREDNISOLONE SODIUM SUCC 125 MG IJ SOLR
125.0000 mg | Freq: Once | INTRAMUSCULAR | Status: AC
  Administered 2021-05-26: 125 mg via INTRAMUSCULAR

## 2021-05-26 MED ORDER — AMOXICILLIN-POT CLAVULANATE 875-125 MG PO TABS: 1.0000 | ORAL_TABLET | Freq: Two times a day (BID) | ORAL | 0 refills | Status: AC

## 2021-05-26 MED ORDER — BENZONATATE 200 MG PO CAPS: 200.0000 mg | ORAL_CAPSULE | Freq: Three times a day (TID) | ORAL | 0 refills | Status: DC | PRN

## 2021-05-26 NOTE — Discharge Instructions (Signed)
You received a steroid shot in the office today  I have sent in Augmentin for you to take twice a day for 7 days.  I have sent in tessalon perles for you to use one capsule every 8 hours as needed for cough.  Follow up with this office or with primary care if symptoms are persisting.  Follow up in the ER for high fever, trouble swallowing, trouble breathing, other concerning symptoms.

## 2021-05-26 NOTE — ED Provider Notes (Signed)
[facility]    CSN: [id number] Arrival date & time: 05/26/21  1002      History   Chief Complaint No chief complaint on file.   HPI [name] is a 70 y.o. female.   Reports that she has been having left ear pain, cough and nasal congestion for the last week and a half.  Reports that she is having sinus pain and pressure as well.  Negative COVID test at home.  Reports that her husband is sick as well.  Has negative history of COVID.  Has not completed COVID vaccines.  Has completed flu vaccine.  Has been taking OTC antihistamines as well as over-the-counter cough and cold with no relief.  Shortness of breath, abdominal pain, nausea, vomiting, diarrhea, rash, fever, other symptoms.  ROS per HPI  The history is provided by the patient.    Past Medical History:  Diagnosis Date  . Allergic rhinitis   . GERD (gastroesophageal reflux disease)   . Heart murmur   . History of pneumonia   . Hyperlipidemia   . PAD (peripheral artery disease) (HCC) 2017   Status post left distal superficial femoral to tibio-peroneal trunk bypass - Dr Hart [place]    Patient Active Problem List   Diagnosis Date Noted  . PAD (peripheral artery disease) (HCC) 03/11/2016  . Spinal stenosis of lumbar region 12/26/2015  . Fever 06/05/2011    Past Surgical History:  Procedure Laterality Date  . ABDOMINAL AORTOGRAM W/LOWER EXTREMITY N/A 09/23/2017   Procedure: ABDOMINAL AORTOGRAM W/LOWER EXTREMITY;  Surgeon: Maeola Harman, MD;  Location: [facility] INVASIVE CV LAB;  Service: Cardiovascular;  Laterality: N/A;  . ABDOMINAL HYSTERECTOMY    . CHOLECYSTECTOMY    . ENDARTERECTOMY FEMORAL Left 04/30/2016   Procedure: Endarterectomy of Left popliteal, anterior tibial and tibio-peroneal trunk;  Surgeon: Pryor Ochoa, MD;  Location: [facility] OR;  Service: Vascular;  Laterality: Left;  . FEMORAL-POPLITEAL BYPASS GRAFT Left 04/30/2016   Procedure: Left Distal Superfical Femoral to Tibio-peroneal trunk  bypass using non-reversed saphenous vein graph Left Leg;  Surgeon: Pryor Ochoa, MD;  Location: [facility] OR;  Service: Vascular;  Laterality: Left;  . INTRAOPERATIVE ARTERIOGRAM Left 04/30/2016   Procedure: Left Leg INTRA OPERATIVE ARTERIOGRAM;  Surgeon: Pryor Ochoa, MD;  Location: [facility] OR;  Service: Vascular;  Laterality: Left;  . LUMBAR LAMINECTOMY/DECOMPRESSION MICRODISCECTOMY Left 12/26/2015   Procedure: LUMBAR LAMINECTOMY/DECOMPRESSION MICRODISCECTOMY 1 LEVEL L4-5 ON LEFT ;  Surgeon: Jene Every, MD;  Location: [facility] ORS;  Service: Orthopedics;  Laterality: Left;  . OOPHORECTOMY Right   . PERIPHERAL VASCULAR CATHETERIZATION N/A 03/25/2016   Procedure: Abdominal Aortogram w/Lower Extremity;  Surgeon: Nada Libman, MD;  Location: [facility] INVASIVE CV LAB;  Service: Cardiovascular;  Laterality: N/A;  . PERIPHERAL VASCULAR CATHETERIZATION N/A 08/20/2016   Procedure: Abdominal Aortogram w/Lower Extremity;  Surgeon: Maeola Harman, MD;  Location: [facility] INVASIVE CV LAB;  Service: Cardiovascular;  Laterality: N/A;  . PERIPHERAL VASCULAR CATHETERIZATION Left 08/20/2016   Procedure: Peripheral Vascular Balloon Angioplasty;  Surgeon: Maeola Harman, MD;  Location: [facility] INVASIVE CV LAB;  Service: Cardiovascular;  Laterality: Left;  Superficial femoral    OB History   No obstetric history on file.      Home Medications    Prior to Admission medications   Medication Sig Start Date End Date Taking? Authorizing Provider  amoxicillin-clavulanate (AUGMENTIN) 875-125 MG tablet Take 1 tablet by mouth 2 (two) times daily for 7 days. 05/26/21 06/02/21 Yes Moshe Cipro, NP  benzonatate (  TESSALON) 200 MG capsule Take 1 capsule (200 mg total) by mouth 3 (three) times daily as needed for cough. 05/26/21  Yes Moshe Cipro, NP  acetaminophen (TYLENOL) 325 MG tablet Take 650 mg by mouth every 6 (six) hours as needed for mild pain or moderate pain.    [provider]  aspirin EC 81 MG tablet  Take 81 mg by mouth daily.    [provider]  levocetirizine (XYZAL) 5 MG tablet Take 5 mg by mouth every evening.     [provider]  metoprolol succinate (TOPROL XL) 25 MG 24 hr tablet Take 0.5 tablets (12.5 mg total) by mouth daily. 04/22/21   Jonelle Sidle, MD  montelukast (SINGULAIR) 10 MG tablet Take 10 mg by mouth at bedtime.    [provider]  pantoprazole (PROTONIX) 40 MG tablet Take 40 mg by mouth daily before breakfast.     [provider]  rosuvastatin (CRESTOR) 20 MG tablet Take 1 tablet (20 mg total) by mouth daily. 05/01/21   Jonelle Sidle, MD    Family History Family History  Problem Relation Age of Onset  . Other Mother        Brain tumor  . CAD Father   . Heart attack Father     Social History Social History   Tobacco Use  . Smoking status: Current Every Day Smoker    Packs/day: 1.00    Years: 45.00    Pack years: 45.00    Types: Cigarettes  . Smokeless tobacco: Never Used  . Tobacco comment: 3 per day  Vaping Use  . Vaping Use: Never used  Substance Use Topics  . Alcohol use: No    Alcohol/week: 0.0 standard drinks  . Drug use: No     Allergies   Clarithromycin, Levofloxacin, and Red dye   Review of Systems Review of Systems   Physical Exam Triage Vital Signs ED Triage Vitals [05/26/21 1007]  Enc Vitals Group     BP (!) 171/76     Pulse Rate 89     Resp 18     Temp 98.2 F (36.8 C)     Temp Source Oral     SpO2 94 %     Weight      Height      Head Circumference      Peak Flow      Pain Score 4     Pain Loc      Pain Edu?      Excl. in GC?    No data found.  Updated Vital Signs BP (!) 171/76 (BP Location: Right Arm)   Pulse 89   Temp 98.2 F (36.8 C) (Oral)   Resp 18   SpO2 94%       Physical Exam Vitals and nursing note reviewed.  Constitutional:      General: She is not in acute distress.    Appearance: She is well-developed.  HENT:     Head: Normocephalic and  atraumatic.     Right Ear: Tympanic membrane, ear canal and external ear normal.     Left Ear: Tympanic membrane, ear canal and external ear normal.     Nose: Congestion present.     Comments: Frontal and maxillary sinus tenderness    Mouth/Throat:     Mouth: Mucous membranes are moist.     Pharynx: Oropharynx is clear. Posterior oropharyngeal erythema present.     Comments: Cobblestoning present Eyes:     Extraocular  Movements: Extraocular movements intact.     Conjunctiva/sclera: Conjunctivae normal.     Pupils: Pupils are equal, round, and reactive to light.  Cardiovascular:     Rate and Rhythm: Normal rate and regular rhythm.     Heart sounds: Normal heart sounds. No murmur heard.   Pulmonary:     Effort: Pulmonary effort is normal. No respiratory distress.     Breath sounds: No stridor. No wheezing, rhonchi or rales.  Chest:     Chest wall: No tenderness.  Abdominal:     General: Bowel sounds are normal. There is no distension.     Palpations: Abdomen is soft. There is no mass.     Tenderness: There is no abdominal tenderness. There is no right CVA tenderness, guarding or rebound.     Hernia: No hernia is present.  Musculoskeletal:        General: Normal range of motion.     Cervical back: Normal range of motion and neck supple.  Lymphadenopathy:     Cervical: Cervical adenopathy present.  Skin:    General: Skin is warm and dry.     Capillary Refill: Capillary refill takes less than 2 seconds.  Neurological:     General: No focal deficit present.     Mental Status: She is alert and oriented to person, place, and time.  Psychiatric:        Mood and Affect: Mood normal.        Behavior: Behavior normal.        Thought Content: Thought content normal.      UC Treatments / Results  Labs (all labs ordered are listed, but only abnormal results are displayed) Labs Reviewed - No data to display  EKG   Radiology No results found.  Procedures Procedures  (including critical care time)  Medications Ordered in UC Medications  methylPREDNISolone sodium succinate (SOLU-MEDROL) 125 mg/2 mL injection 125 mg (125 mg Intramuscular Given 05/26/21 1028)    Initial Impression / Assessment and Plan / UC Course  I have reviewed the triage vital signs and the nursing notes.  Pertinent labs & imaging results that were available during my care of the patient were reviewed by me and considered in my medical decision making (see chart for details).    Acute pansinusitis Acute bronchitis  Solu-Medrol 125 mg IM given in office today Augmentin 875 mg twice daily x7 days prescribed Tessalon Perles prescribed.  Cough Declines COVID and flu testing today Follow up with this office or with primary care if symptoms are persisting.  Follow up in the ER for high fever, trouble swallowing, trouble breathing, other concerning symptoms.   Final Clinical Impressions(s) / UC Diagnoses   Final diagnoses:  Acute non-recurrent pansinusitis  Acute bronchitis, unspecified organism     Discharge Instructions     You received a steroid shot in the office today  I have sent in Augmentin for you to take twice a day for 7 days.  I have sent in tessalon perles for you to use one capsule every 8 hours as needed for cough.  Follow up with this office or with primary care if symptoms are persisting.  Follow up in the ER for high fever, trouble swallowing, trouble breathing, other concerning symptoms.     ED Prescriptions    Medication Sig Dispense Auth. Provider   amoxicillin-clavulanate (AUGMENTIN) 875-125 MG tablet Take 1 tablet by mouth 2 (two) times daily for 7 days. 14 tablet Moshe Cipro, NP   benzonatate (  TESSALON) 200 MG capsule Take 1 capsule (200 mg total) by mouth 3 (three) times daily as needed for cough. 20 capsule Moshe CiproMatthews, Charleene Callegari, NP     PDMP not reviewed this encounter.   Moshe CiproMatthews, Dontrel Smethers, NP 05/26/21 1029

## 2021-05-26 NOTE — ED Triage Notes (Addendum)
LT ear pain, cough and congestion  X 1/ 1/2 weeks.  Neg covid test.  Husband sick with same s/s

## 2021-06-08 ENCOUNTER — Other Ambulatory Visit: Payer: Self-pay

## 2021-06-08 ENCOUNTER — Ambulatory Visit
Admission: RE | Admit: 2021-06-08 | Discharge: 2021-06-08 | Disposition: A | Discharge status: Home / Self Care | Payer: PPO | Source: Ambulatory Visit | Attending: Family Medicine | Admitting: Family Medicine

## 2021-06-08 VITALS — BP 171/84 | HR 86 | Temp 98.4°F | Resp 16

## 2021-06-08 DIAGNOSIS — J329 Chronic sinusitis, unspecified: Secondary | ICD-10-CM

## 2021-06-08 MED ORDER — AMOXICILLIN 875 MG PO TABS
875.0000 mg | ORAL_TABLET | Freq: Two times a day (BID) | ORAL | 0 refills | Status: AC
Start: 2021-06-08 — End: 2021-06-15

## 2021-06-08 MED ORDER — DEXAMETHASONE SODIUM PHOSPHATE 10 MG/ML IJ SOLN
10.0000 mg | Freq: Once | INTRAMUSCULAR | Status: AC
  Administered 2021-06-08: 10 mg via INTRAMUSCULAR

## 2021-06-08 MED ORDER — BENZONATATE 100 MG PO CAPS: 100.0000 mg | ORAL_CAPSULE | Freq: Three times a day (TID) | ORAL | 0 refills | Status: DC

## 2021-06-08 NOTE — ED Triage Notes (Signed)
Facial pressure, congestion, cough, throat sore on left side x 4 days.  Hx of previous sinus infection 2 weeks ago.  States she had the carpet removed in her house this week and thinks this may have caused a flare up of her allergies.

## 2021-06-08 NOTE — Discharge Instructions (Addendum)
Steroid injection given in the office today to help open you up  I have sent in amoxicillin for you to take twice a day for 7 days  I have sent in tessalon perles for you to use one capsule every 8 hours as needed for cough.  Follow up with this office or with primary care if symptoms are persisting.  Follow up in the ER for high fever, trouble swallowing, trouble breathing, other concerning symptoms.

## 2021-06-08 NOTE — ED Provider Notes (Signed)
[facility]   [id number] 06/08/21 Arrival Time: 5885  OY:DXAJ THROAT  SUBJECTIVE: History from: patient.  [name] is a 70 y.o. female who presents with abrupt onset of nasal congestion, cough, sore throat, headache, sinus pain and pressure, fatigue for the last 4 days.  Denies sick exposure to Covid, strep, flu or mono, or precipitating event. Has negative history of Covid. Reports that she has been taking OTC antihistamines with little relief. States that she had carpets replaced at home and is Has not had Covid vaccines. There are no aggravating symptoms. Denies previous symptoms in the past.     Denies fever, chills, ear pain, SOB, wheezing, chest pain, nausea, rash, changes in bowel or bladder habits.     ROS: As per HPI.  All other pertinent ROS negative.     Past Medical History:  Diagnosis Date   Allergic rhinitis    GERD (gastroesophageal reflux disease)    Heart murmur    History of pneumonia    Hyperlipidemia    PAD (peripheral artery disease) (HCC) 2017   Status post left distal superficial femoral to tibio-peroneal trunk bypass - Dr Hart [place]   Past Surgical History:  Procedure Laterality Date   ABDOMINAL AORTOGRAM W/LOWER EXTREMITY N/A 09/23/2017   Procedure: ABDOMINAL AORTOGRAM W/LOWER EXTREMITY;  Surgeon: Maeola Harman, MD;  Location: [facility] INVASIVE CV LAB;  Service: Cardiovascular;  Laterality: N/A;   ABDOMINAL HYSTERECTOMY     CHOLECYSTECTOMY     ENDARTERECTOMY FEMORAL Left 04/30/2016   Procedure: Endarterectomy of Left popliteal, anterior tibial and tibio-peroneal trunk;  Surgeon: Pryor Ochoa, MD;  Location: [facility] OR;  Service: Vascular;  Laterality: Left;   FEMORAL-POPLITEAL BYPASS GRAFT Left 04/30/2016   Procedure: Left Distal Superfical Femoral to Tibio-peroneal trunk bypass using non-reversed saphenous vein graph Left Leg;  Surgeon: Pryor Ochoa, MD;  Location: [facility] OR;  Service: Vascular;  Laterality: Left;   INTRAOPERATIVE ARTERIOGRAM  Left 04/30/2016   Procedure: Left Leg INTRA OPERATIVE ARTERIOGRAM;  Surgeon: Pryor Ochoa, MD;  Location: [facility] OR;  Service: Vascular;  Laterality: Left;   LUMBAR LAMINECTOMY/DECOMPRESSION MICRODISCECTOMY Left 12/26/2015   Procedure: LUMBAR LAMINECTOMY/DECOMPRESSION MICRODISCECTOMY 1 LEVEL L4-5 ON LEFT ;  Surgeon: Jene Every, MD;  Location: [facility] ORS;  Service: Orthopedics;  Laterality: Left;   OOPHORECTOMY Right    PERIPHERAL VASCULAR CATHETERIZATION N/A 03/25/2016   Procedure: Abdominal Aortogram w/Lower Extremity;  Surgeon: Nada Libman, MD;  Location: [facility] INVASIVE CV LAB;  Service: Cardiovascular;  Laterality: N/A;   PERIPHERAL VASCULAR CATHETERIZATION N/A 08/20/2016   Procedure: Abdominal Aortogram w/Lower Extremity;  Surgeon: Maeola Harman, MD;  Location: [facility] INVASIVE CV LAB;  Service: Cardiovascular;  Laterality: N/A;   PERIPHERAL VASCULAR CATHETERIZATION Left 08/20/2016   Procedure: Peripheral Vascular Balloon Angioplasty;  Surgeon: Maeola Harman, MD;  Location: [facility] INVASIVE CV LAB;  Service: Cardiovascular;  Laterality: Left;  Superficial femoral   Allergies  Allergen Reactions   Red Dye Hives and Itching   No current facility-administered medications on file prior to encounter.   Current Outpatient Medications on File Prior to Encounter  Medication Sig Dispense Refill   acetaminophen (TYLENOL) 325 MG tablet Take 650 mg by mouth every 6 (six) hours as needed for mild pain or moderate pain.     aspirin EC 81 MG tablet Take 81 mg by mouth daily.     levocetirizine (XYZAL) 5 MG tablet Take 5 mg by mouth every evening.      montelukast (SINGULAIR) 10 MG tablet  Take 10 mg by mouth at bedtime.     pantoprazole (PROTONIX) 40 MG tablet Take 40 mg by mouth daily before breakfast.      rosuvastatin (CRESTOR) 20 MG tablet Take 1 tablet (20 mg total) by mouth daily. 90 tablet 3   Social History   Socioeconomic History   Marital status: Married    Spouse name: Not on file    Number of children: Not on file   Years of education: Not on file   Highest education level: Not on file  Occupational History   Not on file  Tobacco Use   Smoking status: Every Day    Packs/day: 1.00    Years: 45.00    Pack years: 45.00    Types: Cigarettes   Smokeless tobacco: Never   Tobacco comments:    3 per day  Vaping Use   Vaping Use: Never used  Substance and Sexual Activity   Alcohol use: No    Alcohol/week: 0.0 standard drinks   Drug use: No   Sexual activity: Not on file  Other Topics Concern   Not on file  Social History Narrative   Not on file   Social Determinants of Health   Financial Resource Strain: Not on file  Food Insecurity: Not on file  Transportation Needs: Not on file  Physical Activity: Not on file  Stress: Not on file  Social Connections: Not on file  Intimate Partner Violence: Not on file   Family History  Problem Relation Age of Onset   Other Mother        Brain tumor   CAD Father    Heart attack Father     OBJECTIVE:  Vitals:   06/08/21 0848  BP: (!) 171/84  Pulse: 86  Resp: 16  Temp: 98.4 F (36.9 C)  TempSrc: Oral  SpO2: 94%     General appearance: alert; appears fatigued, but nontoxic, speaking in full sentences and managing own secretions HEENT: NCAT; Ears: EACs clear, TMs pearly gray with visible cone of light, without erythema; Eyes: PERRL, EOMI grossly; Nose: no obvious rhinorrhea; Throat: oropharynx erythematous, tonsils 1+ and mildly erythematous without white tonsillar exudates, cobblestoning present, uvula midline; Sinuses: tender to palpation Neck: supple with LAD Lungs: CTA bilaterally without adventitious breath sounds; cough absent Heart: regular rate and rhythm.  Radial pulses 2+ symmetrical bilaterally Skin: warm and dry Psychological: alert and cooperative; normal mood and affect  LABS: No results found for this or any previous visit (from the past 24 hour(s)).   ASSESSMENT & PLAN:  1. Recurrent  sinusitis     Meds ordered this encounter  Medications   benzonatate (TESSALON) 100 MG capsule    Sig: Take 1 capsule (100 mg total) by mouth every 8 (eight) hours.    Dispense:  21 capsule    Refill:  0    Order Specific Question:   Supervising Provider    Answer:   Merrilee Jansky [[id number]]   amoxicillin (AMOXIL) 875 MG tablet    Sig: Take 1 tablet (875 mg total) by mouth 2 (two) times daily for 7 days.    Dispense:  14 tablet    Refill:  0    Order Specific Question:   Supervising Provider    Answer:   Merrilee Jansky [[id number]]   dexamethasone (DECADRON) injection 10 mg    Acute Sinusitis Push fluids and get rest Prescribed amoxicillin 875mg  twice daily for 10 days.   Take as directed and to completion.  Prescribed tessalon perles for cough prn Decadron 10mg  IM in office today Drink warm or cool liquids, use throat lozenges, or popsicles to help alleviate symptoms Take OTC ibuprofen or tylenol as needed for pain May use Zyrtec D and flonase to help alleviate symptoms Follow up with PCP if symptoms persist Return or go to ER if you have any new or worsening symptoms such as fever, chills, nausea, vomiting, worsening sore throat, cough, abdominal pain, chest pain, changes in bowel or bladder habits.   Reviewed expectations re: course of current medical issues. Questions answered. Outlined signs and symptoms indicating need for more acute intervention. Patient verbalized understanding. After Visit Summary given.           , NP 06/08/21 [phone]

## 2021-07-23 DIAGNOSIS — M5416 Radiculopathy, lumbar region: Secondary | ICD-10-CM | POA: Diagnosis not present

## 2021-07-25 NOTE — Progress Notes (Signed)
Cardiology Office Note  Date: 07/26/2021   ID: [name], DOB [birth year], MRN [id number]  PCP:  Elfredia Nevins, MD  Cardiologist:  Nona Dell, MD Electrophysiologist:  None   Chief Complaint  Patient presents with   Cardiac follow-up    History of Present Illness: [name] is a 70 y.o. female last seen in March.  She is here for a follow-up visit.  States that she has been doing well, no exertional chest pain or unusual shortness of breath.  We went over the results of her interval cardiac testing.  Echocardiogram and Lexiscan Myoview results are noted below.  LVEF vigorous at 65 to 70% with mild diastolic dysfunction.  Mitral and aortic annular calcification noted as well as aortic valve sclerosis, but no stenosis.  She did have evidence of RCA distribution ischemia which we have managed medically so far.  She was switched from Pravachol to Crestor in light of follow-up LDL 141.  She is tolerating aspirin and Toprol-XL.  We have discussed warning signs and symptoms that would prompt further evaluation.  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  Past Medical History:  Diagnosis Date   Allergic rhinitis    GERD (gastroesophageal reflux disease)    Heart murmur    History of pneumonia    Hyperlipidemia    PAD (peripheral artery disease) (HCC) 2017   Status post left distal superficial femoral to tibio-peroneal trunk bypass - Dr Hart [place]    Past Surgical History:  Procedure Laterality Date   ABDOMINAL AORTOGRAM W/LOWER EXTREMITY N/A 09/23/2017   Procedure: ABDOMINAL AORTOGRAM W/LOWER EXTREMITY;  Surgeon: Maeola Harman, MD;  Location: [facility] INVASIVE CV LAB;  Service: Cardiovascular;  Laterality: N/A;   ABDOMINAL HYSTERECTOMY     CHOLECYSTECTOMY     ENDARTERECTOMY FEMORAL Left 04/30/2016   Procedure: Endarterectomy of Left popliteal, anterior tibial and tibio-peroneal trunk;  Surgeon: Pryor Ochoa, MD;  Location: [facility] OR;  Service: Vascular;  Laterality: Left;   FEMORAL-POPLITEAL BYPASS GRAFT Left 04/30/2016   Procedure: Left Distal Superfical Femoral to Tibio-peroneal trunk bypass using non-reversed saphenous vein graph Left Leg;  Surgeon: Pryor Ochoa, MD;  Location: [facility] OR;  Service: Vascular;  Laterality: Left;   INTRAOPERATIVE ARTERIOGRAM Left 04/30/2016   Procedure: Left Leg INTRA OPERATIVE ARTERIOGRAM;  Surgeon: Pryor Ochoa, MD;  Location: [facility] OR;  Service: Vascular;  Laterality: Left;   LUMBAR LAMINECTOMY/DECOMPRESSION MICRODISCECTOMY Left 12/26/2015   Procedure: LUMBAR LAMINECTOMY/DECOMPRESSION MICRODISCECTOMY 1 LEVEL L4-5 ON LEFT ;  Surgeon: Jene Every, MD;  Location: [facility] ORS;  Service: Orthopedics;  Laterality: Left;   OOPHORECTOMY Right    PERIPHERAL VASCULAR CATHETERIZATION N/A 03/25/2016   Procedure: Abdominal Aortogram w/Lower Extremity;  Surgeon: Nada Libman, MD;  Location: [facility] INVASIVE CV LAB;  Service: Cardiovascular;  Laterality: N/A;   PERIPHERAL VASCULAR CATHETERIZATION N/A 08/20/2016   Procedure: Abdominal Aortogram w/Lower Extremity;  Surgeon: Maeola Harman, MD;  Location: [facility] INVASIVE CV LAB;  Service: Cardiovascular;  Laterality: N/A;   PERIPHERAL VASCULAR CATHETERIZATION Left 08/20/2016   Procedure: Peripheral Vascular Balloon Angioplasty;  Surgeon: Maeola Harman, MD;  Location: [facility] INVASIVE CV LAB;  Service: Cardiovascular;  Laterality: Left;  Superficial femoral    Current Outpatient Medications  Medication Sig Dispense Refill   acetaminophen (TYLENOL) 325 MG tablet Take 650 mg by mouth every 6 (six) hours as needed for mild pain or moderate pain.     aspirin EC  81 MG tablet Take 81 mg by mouth daily.     levocetirizine (XYZAL) 5 MG tablet Take 5 mg by mouth every evening.      metoprolol succinate (TOPROL-XL) 25 MG 24 hr tablet Take 12.5 mg by mouth daily.     montelukast (SINGULAIR) 10 MG tablet Take 10 mg by mouth at bedtime.      pantoprazole (PROTONIX) 40 MG tablet Take 40 mg by mouth daily before breakfast.      rosuvastatin (CRESTOR) 20 MG tablet Take 1 tablet (20 mg total) by mouth daily. 90 tablet 3   benzonatate (TESSALON) 100 MG capsule Take 1 capsule (100 mg total) by mouth every 8 (eight) hours. (Patient not taking: Reported on 07/26/2021) 21 capsule 0   No current facility-administered medications for this visit.   Allergies:  Red dye   ROS: No syncope.  Physical Exam: VS:  BP 112/62   Pulse 90   Ht 5\' 3"  (1.6 m)   Wt 146 lb 3.2 oz (66.3 kg)   SpO2 96%   BMI 25.90 kg/m , BMI Body mass index is 25.9 kg/m.  Wt Readings from Last 3 Encounters:  07/26/21 146 lb 3.2 oz (66.3 kg)  03/27/21 146 lb (66.2 kg)  05/24/20 142 lb (64.4 kg)    General: Patient appears comfortable at rest. HEENT: Conjunctiva and lids normal, wearing a mask. Neck: Supple, no elevated JVP or carotid bruits, no thyromegaly. Lungs: Clear to auscultation, nonlabored breathing at rest. Cardiac: Regular rate and rhythm, no S3, 2-3/6 systolic murmur, no pericardial rub. Extremities: No pitting edema.  ECG:  An ECG dated 03/27/2021 was personally reviewed today and demonstrated:  Sinus rhythm.  Recent Labwork:    Component Value Date/Time   CHOL 208 (H) 05/01/2021 1018   TRIG 80 05/01/2021 1018   HDL 51 05/01/2021 1018   CHOLHDL 4.1 05/01/2021 1018   VLDL 16 05/01/2021 1018   LDLCALC 141 (H) 05/01/2021 1018    Other Studies Reviewed Today:  Echocardiogram 04/19/2021:  1. Left ventricular ejection fraction, by estimation, is 65 to 70%. The  left ventricle has normal function. The left ventricle has no regional  wall motion abnormalities. Left ventricular diastolic parameters are  consistent with Grade I diastolic  dysfunction (impaired relaxation).   2. Right ventricular systolic function is normal. The right ventricular  size is normal. Tricuspid regurgitation signal is inadequate for assessing  PA pressure.   3. The  mitral valve is abnormal, mildly thickened and moderately  calcified. Trivial mitral valve regurgitation. Moderate mitral annular  calcification.   4. The aortic valve is tricuspid. There is moderate calcification of the  aortic valve. Aortic valve regurgitation is not visualized. Mild to  moderate aortic valve sclerosis/calcification is present, without any  evidence of aortic stenosis.   5. The inferior vena cava is normal in size with greater than 50%  respiratory variability, suggesting right atrial pressure of 3 mmHg.   Lexiscan Myoview 04/19/2021: No diagnostic ST segment changes indicate ischemia. Medium sized, moderate intensity, mid to basal inferoseptal defect that is partially reversible and consistent with ischemia. This is an intermediate risk study. Nuclear stress EF: 70%.  Assessment and Plan:  1.  Ischemic heart disease based on Myoview in April indicating potential RCA distribution ischemia.  LVEF vigorous.  She states that she has been doing well with no regular exertional chest pain or unusual shortness of breath with typical activities.  Plan is to continue aspirin, Toprol-XL, and Crestor.  We have discussed  warning signs and symptoms that would prompt further investigation.  2.  Mixed hyperlipidemia, recent LDL 141.  We have changed from Pravachol to Crestor.  Check FLP for next visit.  3.  History of tobacco abuse, smoking cessation has been discussed.  4.  Cardiac murmur.  Aortic valve is sclerotic but not stenotic by recent echocardiogram.  5.  PAD status post left distal superficial femoral to tibio-peroneal trunk bypass in 2017.  No active claudication.  Medication Adjustments/Labs and Tests Ordered: Current medicines are reviewed at length with the patient today.  Concerns regarding medicines are outlined above.   Tests Ordered: Orders Placed This Encounter  Procedures   Lipid Profile     Medication Changes: No orders of the defined types were placed  in this encounter.   Disposition:  Follow up  6 months, sooner if needed.  Signed, Jonelle Sidle, MD, [facility] 07/26/2021 10:35 AM    [facility] [address] [place], [place] [address] Phone: [phone]; Fax: [phone]

## 2021-07-26 ENCOUNTER — Other Ambulatory Visit: Payer: Self-pay

## 2021-07-26 ENCOUNTER — Encounter: Payer: Self-pay | Admitting: Cardiology

## 2021-07-26 ENCOUNTER — Ambulatory Visit: Payer: PPO | Admitting: Cardiology

## 2021-07-26 VITALS — BP 112/62 | HR 90 | Ht 63.0 in | Wt 146.2 lb

## 2021-07-26 DIAGNOSIS — I739 Peripheral vascular disease, unspecified: Secondary | ICD-10-CM

## 2021-07-26 DIAGNOSIS — I358 Other nonrheumatic aortic valve disorders: Secondary | ICD-10-CM

## 2021-07-26 DIAGNOSIS — E782 Mixed hyperlipidemia: Secondary | ICD-10-CM

## 2021-07-26 DIAGNOSIS — I259 Chronic ischemic heart disease, unspecified: Secondary | ICD-10-CM | POA: Diagnosis not present

## 2021-07-26 NOTE — Patient Instructions (Signed)
Medication Instructions:  Your physician recommends that you continue on your current medications as directed. Please refer to the Current Medication list given to you today.  *If you need a refill on your cardiac medications before your next appointment, please call your pharmacy*   Lab Work:  FASTING Lipids Just before next visit   If you have labs (blood work) drawn today and your tests are completely normal, you will receive your results only by: MyChart Message (if you have MyChart) OR A paper copy in the mail If you have any lab test that is abnormal or we need to change your treatment, we will call you to review the results.   Testing/Procedures: None today    Follow-Up: At [facility], you and your health needs are our priority.  As part of our continuing mission to provide you with exceptional heart care, we have created designated Provider Care Teams.  These Care Teams include your primary Cardiologist (physician) and Advanced Practice Providers (APPs -  Physician Assistants and Nurse Practitioners) who all work together to provide you with the care you need, when you need it.  We recommend signing up for the patient portal called "MyChart".  Sign up information is provided on this After Visit Summary.  MyChart is used to connect with patients for Virtual Visits (Telemedicine).  Patients are able to view lab/test results, encounter notes, upcoming appointments, etc.  Non-urgent messages can be sent to your provider as well.   To learn more about what you can do with MyChart, go to [email or web address].    Your next appointment:   6 month(s)  The format for your next appointment:   In Person  Provider:   Nona Dell, MD   Other Instructions None

## 2021-08-12 DIAGNOSIS — Z5181 Encounter for therapeutic drug level monitoring: Secondary | ICD-10-CM | POA: Diagnosis not present

## 2021-08-12 DIAGNOSIS — Z79899 Other long term (current) drug therapy: Secondary | ICD-10-CM | POA: Diagnosis not present

## 2021-08-12 DIAGNOSIS — M791 Myalgia, unspecified site: Secondary | ICD-10-CM | POA: Diagnosis not present

## 2021-08-27 ENCOUNTER — Ambulatory Visit: Payer: Self-pay

## 2021-08-27 ENCOUNTER — Ambulatory Visit
Admission: EM | Admit: 2021-08-27 | Discharge: 2021-08-27 | Disposition: A | Discharge status: Home / Self Care | Payer: PPO | Attending: Physician Assistant | Admitting: Physician Assistant

## 2021-08-27 ENCOUNTER — Other Ambulatory Visit: Payer: Self-pay

## 2021-08-27 DIAGNOSIS — J0120 Acute ethmoidal sinusitis, unspecified: Secondary | ICD-10-CM | POA: Diagnosis not present

## 2021-08-27 DIAGNOSIS — J329 Chronic sinusitis, unspecified: Secondary | ICD-10-CM

## 2021-08-27 MED ORDER — BENZONATATE 100 MG PO CAPS: 100.0000 mg | ORAL_CAPSULE | Freq: Four times a day (QID) | ORAL | 0 refills | Status: DC | PRN

## 2021-08-27 MED ORDER — DEXAMETHASONE SODIUM PHOSPHATE 10 MG/ML IJ SOLN
10.0000 mg | Freq: Once | INTRAMUSCULAR | Status: AC
  Administered 2021-08-27: 10 mg via INTRAMUSCULAR

## 2021-08-27 MED ORDER — AMOXICILLIN 500 MG PO CAPS: 500.0000 mg | ORAL_CAPSULE | Freq: Three times a day (TID) | ORAL | 0 refills | Status: DC

## 2021-08-27 NOTE — ED Provider Notes (Signed)
[facility]    CSN: [id number] Arrival date & time: 08/27/21  1537      History   Chief Complaint Chief Complaint  Patient presents with   Nasal Congestion   Appointment   Cough    HPI [name] is a 70 y.o. female.   The history is provided by the patient. No language interpreter was used.  Cough Cough characteristics:  Non-productive Sputum characteristics:  Nondescript Severity:  Moderate Onset quality:  Gradual Timing:  Constant Progression:  Worsening Chronicity:  New Relieved by:  Nothing Ineffective treatments:  None tried Associated symptoms: ear pain and sinus congestion   Associated symptoms: no sore throat   Pt has a history of sinus disease. Pt request an injuetion of decadron and amoxicillian  Past Medical History:  Diagnosis Date   Allergic rhinitis    GERD (gastroesophageal reflux disease)    Heart murmur    History of pneumonia    Hyperlipidemia    PAD (peripheral artery disease) (HCC) 2017   Status post left distal superficial femoral to tibio-peroneal trunk bypass - Dr Hart [place]    Patient Active Problem List   Diagnosis Date Noted   PAD (peripheral artery disease) (HCC) 03/11/2016   Spinal stenosis of lumbar region 12/26/2015   Fever 06/05/2011    Past Surgical History:  Procedure Laterality Date   ABDOMINAL AORTOGRAM W/LOWER EXTREMITY N/A 09/23/2017   Procedure: ABDOMINAL AORTOGRAM W/LOWER EXTREMITY;  Surgeon: Maeola Harman, MD;  Location: [facility] INVASIVE CV LAB;  Service: Cardiovascular;  Laterality: N/A;   ABDOMINAL HYSTERECTOMY     CHOLECYSTECTOMY     ENDARTERECTOMY FEMORAL Left 04/30/2016   Procedure: Endarterectomy of Left popliteal, anterior tibial and tibio-peroneal trunk;  Surgeon: Pryor Ochoa, MD;  Location: [facility] OR;  Service: Vascular;  Laterality: Left;   FEMORAL-POPLITEAL BYPASS GRAFT Left 04/30/2016   Procedure: Left Distal Superfical Femoral to Tibio-peroneal trunk bypass using non-reversed saphenous  vein graph Left Leg;  Surgeon: Pryor Ochoa, MD;  Location: [facility] OR;  Service: Vascular;  Laterality: Left;   INTRAOPERATIVE ARTERIOGRAM Left 04/30/2016   Procedure: Left Leg INTRA OPERATIVE ARTERIOGRAM;  Surgeon: Pryor Ochoa, MD;  Location: [facility] OR;  Service: Vascular;  Laterality: Left;   LUMBAR LAMINECTOMY/DECOMPRESSION MICRODISCECTOMY Left 12/26/2015   Procedure: LUMBAR LAMINECTOMY/DECOMPRESSION MICRODISCECTOMY 1 LEVEL L4-5 ON LEFT ;  Surgeon: Jene Every, MD;  Location: [facility] ORS;  Service: Orthopedics;  Laterality: Left;   OOPHORECTOMY Right    PERIPHERAL VASCULAR CATHETERIZATION N/A 03/25/2016   Procedure: Abdominal Aortogram w/Lower Extremity;  Surgeon: Nada Libman, MD;  Location: [facility] INVASIVE CV LAB;  Service: Cardiovascular;  Laterality: N/A;   PERIPHERAL VASCULAR CATHETERIZATION N/A 08/20/2016   Procedure: Abdominal Aortogram w/Lower Extremity;  Surgeon: Maeola Harman, MD;  Location: [facility] INVASIVE CV LAB;  Service: Cardiovascular;  Laterality: N/A;   PERIPHERAL VASCULAR CATHETERIZATION Left 08/20/2016   Procedure: Peripheral Vascular Balloon Angioplasty;  Surgeon: Maeola Harman, MD;  Location: [facility] INVASIVE CV LAB;  Service: Cardiovascular;  Laterality: Left;  Superficial femoral    OB History   No obstetric history on file.      Home Medications    Prior to Admission medications   Medication Sig Start Date End Date Taking? Authorizing Provider  amoxicillin (AMOXIL) 500 MG capsule Take 1 capsule (500 mg total) by mouth 3 (three) times daily. 08/27/21  Yes Elson Areas, PA-C  acetaminophen (TYLENOL) 325 MG tablet Take 650 mg by mouth every 6 (six) hours as needed for  mild pain or moderate pain.    [provider]  aspirin EC 81 MG tablet Take 81 mg by mouth daily.    [provider]  benzonatate (TESSALON) 100 MG capsule Take 1 capsule (100 mg total) by mouth every 8 (eight) hours. Patient not taking: Reported on 07/26/2021 06/08/21   Moshe Cipro, NP  levocetirizine (XYZAL) 5 MG tablet Take 5 mg by mouth every evening.     [provider]  metoprolol succinate (TOPROL-XL) 25 MG 24 hr tablet Take 12.5 mg by mouth daily. 07/17/21   [provider]  montelukast (SINGULAIR) 10 MG tablet Take 10 mg by mouth at bedtime.    [provider]  pantoprazole (PROTONIX) 40 MG tablet Take 40 mg by mouth daily before breakfast.     [provider]  rosuvastatin (CRESTOR) 20 MG tablet Take 1 tablet (20 mg total) by mouth daily. 05/01/21   Jonelle Sidle, MD    Family History Family History  Problem Relation Age of Onset   Other Mother        Brain tumor   CAD Father    Heart attack Father     Social History Social History   Tobacco Use   Smoking status: Every Day    Packs/day: 0.50    Years: 45.00    Pack years: 22.50    Types: Cigarettes   Smokeless tobacco: Never   Tobacco comments:    3 per day  Vaping Use   Vaping Use: Never used  Substance Use Topics   Alcohol use: No    Alcohol/week: 0.0 standard drinks   Drug use: No     Allergies   Red dye   Review of Systems Review of Systems  HENT:  Positive for ear pain. Negative for sore throat.   Respiratory:  Positive for cough.   All other systems reviewed and are negative.   Physical Exam Triage Vital Signs ED Triage Vitals  Enc Vitals Group     BP 08/27/21 1640 137/80     Pulse Rate 08/27/21 1640 74     Resp 08/27/21 1640 20     Temp 08/27/21 1640 98 F (36.7 C)     Temp src --      SpO2 08/27/21 1640 95 %     Weight --      Height --      Head Circumference --      Peak Flow --      Pain Score 08/27/21 1638 0     Pain Loc --      Pain Edu? --      Excl. in GC? --    No data found.  Updated Vital Signs BP 137/80   Pulse 74   Temp 98 F (36.7 C)   Resp 20   SpO2 95%   Visual Acuity Right Eye Distance:   Left Eye Distance:   Bilateral Distance:    Right Eye Near:   Left Eye Near:    Bilateral  Near:     Physical Exam Constitutional:      Appearance: She is well-developed.  HENT:     Head: Normocephalic and atraumatic.     Mouth/Throat:     Pharynx: Posterior oropharyngeal erythema present.  Eyes:     Conjunctiva/sclera: Conjunctivae normal.     Pupils: Pupils are equal, round, and reactive to light.  Pulmonary:     Effort: Pulmonary effort is normal.  Abdominal:  Palpations: Abdomen is soft.  Musculoskeletal:        General: Normal range of motion.     Cervical back: Normal range of motion and neck supple.  Skin:    General: Skin is warm and dry.  Neurological:     Mental Status: She is alert and oriented to person, place, and time.     UC Treatments / Results  Labs (all labs ordered are listed, but only abnormal results are displayed) Labs Reviewed - No data to display  EKG   Radiology No results found.  Procedures Procedures (including critical care time)  Medications Ordered in UC Medications  dexamethasone (DECADRON) injection 10 mg (has no administration in time range)    Initial Impression / Assessment and Plan / UC Course  I have reviewed the triage vital signs and the nursing notes.  Pertinent labs & imaging results that were available during my care of the patient were reviewed by me and considered in my medical decision making (see chart for details).     MDM:  Pt counseled on mangement of sinus infections  Final Clinical Impressions(s) / UC Diagnoses   Final diagnoses:  Acute ethmoidal sinusitis, recurrence not specified   Discharge Instructions   None    ED Prescriptions     Medication Sig Dispense Auth. Provider   amoxicillin (AMOXIL) 500 MG capsule Take 1 capsule (500 mg total) by mouth 3 (three) times daily. 30 capsule Elson Areas, [place]      PDMP not reviewed this encounter. An After Visit Summary was printed and given to the patient.    Elson Areas, [place] 08/27/21 1742

## 2021-08-27 NOTE — ED Triage Notes (Signed)
Pt has report of nasal congestion and cough, not concerned for covid, states she has chronic sinusitis

## 2021-09-05 DIAGNOSIS — J209 Acute bronchitis, unspecified: Secondary | ICD-10-CM | POA: Diagnosis not present

## 2021-09-05 DIAGNOSIS — Z681 Body mass index (BMI) 19 or less, adult: Secondary | ICD-10-CM | POA: Diagnosis not present

## 2021-09-05 DIAGNOSIS — J329 Chronic sinusitis, unspecified: Secondary | ICD-10-CM | POA: Diagnosis not present

## 2021-09-05 DIAGNOSIS — J449 Chronic obstructive pulmonary disease, unspecified: Secondary | ICD-10-CM | POA: Diagnosis not present

## 2021-10-22 DIAGNOSIS — M791 Myalgia, unspecified site: Secondary | ICD-10-CM | POA: Diagnosis not present

## 2021-12-18 DIAGNOSIS — R52 Pain, unspecified: Secondary | ICD-10-CM | POA: Diagnosis not present

## 2021-12-18 DIAGNOSIS — M533 Sacrococcygeal disorders, not elsewhere classified: Secondary | ICD-10-CM | POA: Diagnosis not present

## 2022-01-15 ENCOUNTER — Telehealth: Payer: Self-pay

## 2022-01-15 NOTE — Telephone Encounter (Signed)
error 

## 2022-02-06 ENCOUNTER — Other Ambulatory Visit: Payer: Self-pay

## 2022-02-06 ENCOUNTER — Ambulatory Visit
Admission: EM | Admit: 2022-02-06 | Discharge: 2022-02-06 | Disposition: A | Discharge status: Home / Self Care | Payer: PPO | Attending: Urgent Care | Admitting: Urgent Care

## 2022-02-06 ENCOUNTER — Ambulatory Visit: Payer: Self-pay

## 2022-02-06 DIAGNOSIS — J0181 Other acute recurrent sinusitis: Secondary | ICD-10-CM

## 2022-02-06 DIAGNOSIS — J3089 Other allergic rhinitis: Secondary | ICD-10-CM | POA: Diagnosis not present

## 2022-02-06 MED ORDER — PREDNISONE 10 MG PO TABS: 30.0000 mg | ORAL_TABLET | Freq: Every day | ORAL | 0 refills | Status: DC

## 2022-02-06 MED ORDER — AMOXICILLIN 875 MG PO TABS: 875.0000 mg | ORAL_TABLET | Freq: Two times a day (BID) | ORAL | 0 refills | Status: DC

## 2022-02-06 NOTE — ED Triage Notes (Signed)
Pt reports cough, nasal congestion and sinus pressure x 2 weeks.

## 2022-02-06 NOTE — ED Provider Notes (Signed)
[facility]   MRN: [id number] DOB: 1951/06/04  Subjective:   [name] is a 71 y.o. female presenting for 2-week history of recurrent sinus pressure, sinus congestion, coughing.  Patient has a history of persistent allergic rhinitis and takes Xyzal and Singulair for this.  She is very consistent with these medications.  Denies any chest pain, shortness of breath or wheezing.  She is a smoker as well.   No current facility-administered medications for this encounter.  Current Outpatient Medications:    acetaminophen (TYLENOL) 325 MG tablet, Take 650 mg by mouth every 6 (six) hours as needed for mild pain or moderate pain., Disp: , Rfl:    aspirin EC 81 MG tablet, Take 81 mg by mouth daily., Disp: , Rfl:    levocetirizine (XYZAL) 5 MG tablet, Take 5 mg by mouth every evening. , Disp: , Rfl:    metoprolol succinate (TOPROL-XL) 25 MG 24 hr tablet, Take 12.5 mg by mouth daily., Disp: , Rfl:    montelukast (SINGULAIR) 10 MG tablet, Take 10 mg by mouth at bedtime., Disp: , Rfl:    pantoprazole (PROTONIX) 40 MG tablet, Take 40 mg by mouth daily before breakfast. , Disp: , Rfl:    rosuvastatin (CRESTOR) 20 MG tablet, Take 1 tablet (20 mg total) by mouth daily., Disp: 90 tablet, Rfl: 3   Allergies  Allergen Reactions   Red Dye Hives and Itching    Past Medical History:  Diagnosis Date   Allergic rhinitis    GERD (gastroesophageal reflux disease)    Heart murmur    History of pneumonia    Hyperlipidemia    PAD (peripheral artery disease) (HCC) 2017   Status post left distal superficial femoral to tibio-peroneal trunk bypass - Dr Hart [place]     Past Surgical History:  Procedure Laterality Date   ABDOMINAL AORTOGRAM W/LOWER EXTREMITY N/A 09/23/2017   Procedure: ABDOMINAL AORTOGRAM W/LOWER EXTREMITY;  Surgeon: Maeola Harman, MD;  [facility]: [facility] INVASIVE CV LAB;  Service: Cardiovascular;  Laterality: N/A;   ABDOMINAL HYSTERECTOMY     CHOLECYSTECTOMY      ENDARTERECTOMY FEMORAL Left 04/30/2016   Procedure: Endarterectomy of Left popliteal, anterior tibial and tibio-peroneal trunk;  Surgeon: Pryor Ochoa, MD;  Location: [facility] OR;  Service: Vascular;  Laterality: Left;   FEMORAL-POPLITEAL BYPASS GRAFT Left 04/30/2016   Procedure: Left Distal Superfical Femoral to Tibio-peroneal trunk bypass using non-reversed saphenous vein graph Left Leg;  Surgeon: Pryor Ochoa, MD;  Location: [facility] OR;  Service: Vascular;  Laterality: Left;   INTRAOPERATIVE ARTERIOGRAM Left 04/30/2016   Procedure: Left Leg INTRA OPERATIVE ARTERIOGRAM;  Surgeon: Pryor Ochoa, MD;  Location: [facility] OR;  Service: Vascular;  Laterality: Left;   LUMBAR LAMINECTOMY/DECOMPRESSION MICRODISCECTOMY Left 12/26/2015   Procedure: LUMBAR LAMINECTOMY/DECOMPRESSION MICRODISCECTOMY 1 LEVEL L4-5 ON LEFT ;  Surgeon: Jene Every, MD;  Location: [facility] ORS;  Service: Orthopedics;  Laterality: Left;   OOPHORECTOMY Right    PERIPHERAL VASCULAR CATHETERIZATION N/A 03/25/2016   Procedure: Abdominal Aortogram w/Lower Extremity;  Surgeon: Nada Libman, MD;  Location: [facility] INVASIVE CV LAB;  Service: Cardiovascular;  Laterality: N/A;   PERIPHERAL VASCULAR CATHETERIZATION N/A 08/20/2016   Procedure: Abdominal Aortogram w/Lower Extremity;  Surgeon: Maeola Harman, MD;  Location: [facility] INVASIVE CV LAB;  Service: Cardiovascular;  Laterality: N/A;   PERIPHERAL VASCULAR CATHETERIZATION Left 08/20/2016   Procedure: Peripheral Vascular Balloon Angioplasty;  Surgeon: Maeola Harman, MD;  Location: [facility] INVASIVE CV LAB;  Service: Cardiovascular;  Laterality: Left;  Superficial  femoral    Family History  Problem Relation Age of Onset   Other Mother        Brain tumor   CAD Father    Heart attack Father     Social History   Tobacco Use   Smoking status: Every Day    Packs/day: 0.50    Years: 45.00    Pack years: 22.50    Types: Cigarettes   Smokeless tobacco: Never   Tobacco comments:    3 per day   Vaping Use   Vaping Use: Never used  Substance Use Topics   Alcohol use: No    Alcohol/week: 0.0 standard drinks   Drug use: No    ROS   Objective:   Vitals: BP 136/77 (BP Location: Right Arm)    Pulse 96    Temp 98.1 F (36.7 C) (Oral)    Resp 20    SpO2 95%   Physical Exam Constitutional:      General: She is not in acute distress.    Appearance: Normal appearance. She is well-developed and normal weight. She is not ill-appearing, toxic-appearing or diaphoretic.  HENT:     Head: Normocephalic and atraumatic.     Right Ear: Tympanic membrane, ear canal and external ear normal. No drainage or tenderness. No middle ear effusion. There is no impacted cerumen. Tympanic membrane is not erythematous.     Left Ear: Tympanic membrane, ear canal and external ear normal. No drainage or tenderness.  No middle ear effusion. There is no impacted cerumen. Tympanic membrane is not erythematous.     Nose: Congestion present. No rhinorrhea.     Comments: Nasal mucosa boggy and edematous.    Mouth/Throat:     Mouth: Mucous membranes are moist. No oral lesions.     Pharynx: No pharyngeal swelling, oropharyngeal exudate, posterior oropharyngeal erythema or uvula swelling.     Tonsils: No tonsillar exudate or tonsillar abscesses.  Eyes:     General: No scleral icterus.       Right eye: No discharge.        Left eye: No discharge.     Extraocular Movements: Extraocular movements intact.     Right eye: Normal extraocular motion.     Left eye: Normal extraocular motion.     Conjunctiva/sclera: Conjunctivae normal.  Cardiovascular:     Rate and Rhythm: Normal rate.     Heart sounds: No murmur heard.   No friction rub. No gallop.  Pulmonary:     Effort: Pulmonary effort is normal. No respiratory distress.     Breath sounds: No stridor. No wheezing, rhonchi or rales.  Chest:     Chest wall: No tenderness.  Musculoskeletal:     Cervical back: Normal range of motion and neck supple.   Lymphadenopathy:     Cervical: No cervical adenopathy.  Skin:    General: Skin is warm and dry.  Neurological:     General: No focal deficit present.     Mental Status: She is alert and oriented to person, place, and time.  Psychiatric:        Mood and Affect: Mood normal.        Behavior: Behavior normal.    Assessment and Plan :   PDMP not reviewed this encounter.  1. Other acute recurrent sinusitis   2. Allergic rhinitis due to other allergic trigger, unspecified seasonality    Will start empiric treatment with amoxicillin for recurrent sinusitis.  Recommended supportive care otherwise.  However, we  will be using an oral steroid course for her underlying allergic rhinitis and history of smoking.  Maintain allergy medications otherwise.  Counseled patient on potential for adverse effects with medications prescribed/recommended today, ER and return-to-clinic precautions discussed, patient verbalized understanding.    Wallis Bamberg, PA-C 02/06/22 1054

## 2022-03-06 ENCOUNTER — Other Ambulatory Visit: Payer: Self-pay

## 2022-03-06 ENCOUNTER — Ambulatory Visit: Payer: PPO | Admitting: Cardiology

## 2022-03-06 ENCOUNTER — Encounter: Payer: Self-pay | Admitting: Cardiology

## 2022-03-06 VITALS — BP 130/76 | HR 77 | Ht 62.0 in | Wt 153.0 lb

## 2022-03-06 DIAGNOSIS — I259 Chronic ischemic heart disease, unspecified: Secondary | ICD-10-CM

## 2022-03-06 DIAGNOSIS — E782 Mixed hyperlipidemia: Secondary | ICD-10-CM

## 2022-03-06 DIAGNOSIS — I358 Other nonrheumatic aortic valve disorders: Secondary | ICD-10-CM | POA: Diagnosis not present

## 2022-03-06 MED ORDER — METOPROLOL SUCCINATE ER 25 MG PO TB24: 25.0000 mg | ORAL_TABLET | Freq: Every day | ORAL | 3 refills | Status: DC

## 2022-03-06 NOTE — Progress Notes (Signed)
? ? ?Cardiology Office Note ? ?Date: 03/06/2022  ? ?[name], DOB 05-Jun-1951, MRN [id number] ? ?PCP:  Redmond School, MD  ?Cardiologist:  Rozann Lesches, MD ?Electrophysiologist:  None  ? ?Chief Complaint  ?Patient presents with  ? Cardiac follow-up  ? ? ?History of Present Illness: ?[name] is a 72 y.o. female last seen in July 2022.  She is here today with her husband for a follow-up visit.  Reports no clear-cut angina symptoms with activity during the daytime.  She does get a twinging sensation in the left side of her chest sometimes when she goes to bed at nighttime.  Does not wake up with symptoms.  Has not had to take any nitroglycerin. ? ?She is due for follow-up FLP.  She reports compliance with Crestor.  We went over the remainder of her medications with plan to increase Toprol-XL to 25 mg daily.  I personally reviewed her ECG which shows normal sinus rhythm. ? ?Past Medical History:  ?Diagnosis Date  ? Allergic rhinitis   ? GERD (gastroesophageal reflux disease)   ? Heart murmur   ? History of pneumonia   ? Hyperlipidemia   ? PAD (peripheral artery disease) ([place]) 2017  ? Status post left distal superficial femoral to tibio-peroneal trunk bypass - Dr Kellie Simmering  ? ? ?Past Surgical History:  ?Procedure Laterality Date  ? ABDOMINAL AORTOGRAM W/LOWER EXTREMITY N/A 09/23/2017  ? Procedure: ABDOMINAL AORTOGRAM W/LOWER EXTREMITY;  Surgeon: Waynetta Sandy, MD;  Location: [place] CV LAB;  Service: Cardiovascular;  Laterality: N/A;  ? ABDOMINAL HYSTERECTOMY    ? CHOLECYSTECTOMY    ? ENDARTERECTOMY FEMORAL Left 04/30/2016  ? Procedure: Endarterectomy of Left popliteal, anterior tibial and tibio-peroneal trunk;  Surgeon: Mal Misty, MD;  Location: [place];  Service: Vascular;  Laterality: Left;  ? FEMORAL-POPLITEAL BYPASS GRAFT Left 04/30/2016  ? Procedure: Left Distal Superfical Femoral to Tibio-peroneal trunk bypass using non-reversed saphenous vein graph Left Leg;  Surgeon: Mal Misty,  MD;  Location: [place];  Service: Vascular;  Laterality: Left;  ? INTRAOPERATIVE ARTERIOGRAM Left 04/30/2016  ? Procedure: Left Leg INTRA OPERATIVE ARTERIOGRAM;  Surgeon: Mal Misty, MD;  Location: [place];  Service: Vascular;  Laterality: Left;  ? LUMBAR LAMINECTOMY/DECOMPRESSION MICRODISCECTOMY Left 12/26/2015  ? Procedure: LUMBAR LAMINECTOMY/DECOMPRESSION MICRODISCECTOMY 1 LEVEL L4-5 ON LEFT ;  Surgeon: Susa Day, MD;  Location: [facility] ORS;  Service: Orthopedics;  Laterality: Left;  ? OOPHORECTOMY Right   ? PERIPHERAL VASCULAR CATHETERIZATION N/A 03/25/2016  ? Procedure: Abdominal Aortogram w/Lower Extremity;  Surgeon: Serafina Mitchell, MD;  Location: [place] CV LAB;  Service: Cardiovascular;  Laterality: N/A;  ? PERIPHERAL VASCULAR CATHETERIZATION N/A 08/20/2016  ? Procedure: Abdominal Aortogram w/Lower Extremity;  Surgeon: Waynetta Sandy, MD;  Location: [place] CV LAB;  Service: Cardiovascular;  Laterality: N/A;  ? PERIPHERAL VASCULAR CATHETERIZATION Left 08/20/2016  ? Procedure: Peripheral Vascular Balloon Angioplasty;  Surgeon: Waynetta Sandy, MD;  Location: [place] CV LAB;  Service: Cardiovascular;  Laterality: Left;  Superficial femoral  ? ? ?Current Outpatient Medications  ?Medication Sig Dispense Refill  ? acetaminophen (TYLENOL) 325 MG tablet Take 650 mg by mouth every 6 (six) hours as needed for mild pain or moderate pain.    ? aspirin EC 81 MG tablet Take 81 mg by mouth daily.    ? levocetirizine (XYZAL) 5 MG tablet Take 5 mg by mouth every evening.     ? metoprolol succinate (TOPROL XL) 25 MG 24  hr tablet Take 1 tablet (25 mg total) by mouth daily. 90 tablet 3  ? montelukast (SINGULAIR) 10 MG tablet Take 10 mg by mouth at bedtime.    ? pantoprazole (PROTONIX) 40 MG tablet Take 40 mg by mouth daily before breakfast.     ? rosuvastatin (CRESTOR) 20 MG tablet Take 1 tablet (20 mg total) by mouth daily. 90 tablet 3  ? amoxicillin (AMOXIL) 875 MG tablet Take 1 tablet (875 mg  total) by mouth 2 (two) times daily. (Patient not taking: Reported on 03/06/2022) 14 tablet 0  ? predniSONE (DELTASONE) 10 MG tablet Take 3 tablets (30 mg total) by mouth daily with breakfast. (Patient not taking: Reported on 03/06/2022) 15 tablet 0  ? ?No current facility-administered medications for this visit.  ? ?Allergies:  Red dye  ? ?ROS: No palpitations or syncope. ? ?Physical Exam: ?VS:  BP 130/76   Pulse 77   Ht 5\' 2"  (1.575 m)   Wt 153 lb (69.4 kg)   SpO2 95%   BMI 27.98 kg/m? , BMI Body mass index is 27.98 kg/m?. ? ?Wt Readings from Last 3 Encounters:  ?03/06/22 153 lb (69.4 kg)  ?07/26/21 146 lb 3.2 oz (66.3 kg)  ?03/27/21 146 lb (66.2 kg)  ?  ?General: Patient appears comfortable at rest. ?HEENT: Conjunctiva and lids normal, wearing a mask. ?Neck: Supple, no elevated JVP or carotid bruits, no thyromegaly. ?Lungs: Clear to auscultation, nonlabored breathing at rest. ?Cardiac: Regular rate and rhythm, no S3, 2/6 systolic murmur, no pericardial rub. ?Extremities: No pitting edema. ? ?ECG:  An ECG dated 03/27/2021 was personally reviewed today and demonstrated:  Sinus rhythm. ? ?Recent Labwork: ?   ?Component Value Date/Time  ? CHOL 208 (H) 05/01/2021 1018  ? TRIG 80 05/01/2021 1018  ? HDL 51 05/01/2021 1018  ? CHOLHDL 4.1 05/01/2021 1018  ? VLDL 16 05/01/2021 1018  ? LDLCALC 141 (H) 05/01/2021 1018  ? ? ?Other Studies Reviewed Today: ? ?Echocardiogram 04/19/2021: ? 1. Left ventricular ejection fraction, by estimation, is 65 to 70%. The  ?left ventricle has normal function. The left ventricle has no regional  ?wall motion abnormalities. Left ventricular diastolic parameters are  ?consistent with Grade I diastolic  ?dysfunction (impaired relaxation).  ? 2. Right ventricular systolic function is normal. The right ventricular  ?size is normal. Tricuspid regurgitation signal is inadequate for assessing  ?PA pressure.  ? 3. The mitral valve is abnormal, mildly thickened and moderately  ?calcified. Trivial mitral  valve regurgitation. Moderate mitral annular  ?calcification.  ? 4. The aortic valve is tricuspid. There is moderate calcification of the  ?aortic valve. Aortic valve regurgitation is not visualized. Mild to  ?moderate aortic valve sclerosis/calcification is present, without any  ?evidence of aortic stenosis.  ? 5. The inferior vena cava is normal in size with greater than 50%  ?respiratory variability, suggesting right atrial pressure of 3 mmHg.  ?  ?Lexiscan Myoview 04/19/2021: ?No diagnostic ST segment changes indicate ischemia. ?Medium sized, moderate intensity, mid to basal inferoseptal defect that is partially reversible and consistent with ischemia. ?This is an intermediate risk study. ?Nuclear stress EF: 70%. ? ?Assessment and Plan: ? ?1.  Ischemic heart disease based on Myoview in April 2022, plan to continue medical therapy per discussion.  She does not report any typical, progressive angina symptoms or nitroglycerin use.  Continue aspirin, increase Toprol-XL to 25 mg daily, and continue Crestor.  I have asked her to let me know if symptoms escalate. ? ?2.  Mixed hyperlipidemia, LDL 141 in May of last year.  She has continued on Crestor 20 mg daily.  Recheck FLP. ? ?3.  Systolic murmur with sclerotic aortic valve by echocardiogram last year, no aortic stenosis. ? ?Medication Adjustments/Labs and Tests Ordered: ?Current medicines are reviewed at length with the patient today.  Concerns regarding medicines are outlined above.  ? ?Tests Ordered: ?Orders Placed This Encounter  ?Procedures  ? Lipid Profile  ? EKG 12-Lead  ? ? ?Medication Changes: ?Meds ordered this encounter  ?Medications  ? metoprolol succinate (TOPROL XL) 25 MG 24 hr tablet  ?  Sig: Take 1 tablet (25 mg total) by mouth daily.  ?  Dispense:  90 tablet  ?  Refill:  3  ?  03/06/22 dose increased to 25 mg qd  ? ? ?Disposition:  Follow up  6 months. ? ?Signed, ?Satira Sark, MD, [facility] ?03/06/2022 4:15 PM    ?[place] at  [facility] ?[address] [place], [place] [address] ?Phone: [phone]; Fax: [phone]  ?

## 2022-03-06 NOTE — Patient Instructions (Signed)
Medication Instructions:  ?INCREASE Toprol to 25 mg daily ? ? ?Labwork: ?Fasting Lipids ? ?Testing/Procedures: ?None today ? ?Follow-Up: ?6 months ? ?Any Other Special Instructions Will Be Listed Below (If Applicable). ? ?If you need a refill on your cardiac medications before your next appointment, please call your pharmacy. ? ?

## 2022-03-07 ENCOUNTER — Other Ambulatory Visit (HOSPITAL_COMMUNITY)
Admission: RE | Admit: 2022-03-07 | Discharge: 2022-03-07 | Disposition: A | Discharge status: Home / Self Care | Payer: PPO | Source: Ambulatory Visit | Attending: Cardiology | Admitting: Cardiology

## 2022-03-07 ENCOUNTER — Telehealth: Payer: Self-pay

## 2022-03-07 DIAGNOSIS — E782 Mixed hyperlipidemia: Secondary | ICD-10-CM | POA: Diagnosis present

## 2022-03-07 LAB — LIPID PANEL
Cholesterol: 159 mg/dL (ref 0–200)
HDL: 43 mg/dL (ref >40)
LDL Cholesterol: 103 mg/dL — ABNORMAL HIGH (ref 0–99)
Total CHOL/HDL Ratio: 3.7 RATIO
Triglycerides: 63 mg/dL (ref <150)
VLDL: 13 mg/dL (ref 0–40)

## 2022-03-07 MED ORDER — ROSUVASTATIN CALCIUM 40 MG PO TABS: 40.0000 mg | ORAL_TABLET | Freq: Every day | ORAL | 3 refills | Status: DC

## 2022-03-07 NOTE — Telephone Encounter (Signed)
I spoke with patient and she will increase Crestor 40 mg daily. E-scribed to [facility] pharmacy ?

## 2022-03-07 NOTE — Telephone Encounter (Signed)
-----   Message from Jonelle Sidle, MD sent at 03/07/2022 11:15 AM EST ----- ?Results reviewed.  LDL has come down to 103 from 141 on Crestor 20 mg daily.  Suggest increase to 40 mg daily. ?

## 2022-03-20 ENCOUNTER — Other Ambulatory Visit: Payer: Self-pay | Admitting: Cardiology

## 2022-03-20 ENCOUNTER — Telehealth: Payer: Self-pay

## 2022-03-20 MED ORDER — METOPROLOL SUCCINATE ER 25 MG PO TB24: 25.0000 mg | ORAL_TABLET | Freq: Every day | ORAL | 3 refills | Status: DC

## 2022-03-20 NOTE — Telephone Encounter (Signed)
Received refill request from pharmacy for Toprol XL 25 mg tablet 1/2 tablet. Sent in correct prescription  ?

## 2022-04-04 ENCOUNTER — Ambulatory Visit
Admission: EM | Admit: 2022-04-04 | Discharge: 2022-04-04 | Disposition: A | Discharge status: Home / Self Care | Payer: PPO | Attending: Family Medicine | Admitting: Family Medicine

## 2022-04-04 ENCOUNTER — Ambulatory Visit: Payer: Self-pay

## 2022-04-04 DIAGNOSIS — Y939 Activity, unspecified: Secondary | ICD-10-CM | POA: Diagnosis not present

## 2022-04-04 DIAGNOSIS — N390 Urinary tract infection, site not specified: Secondary | ICD-10-CM

## 2022-04-04 DIAGNOSIS — W19XXXA Unspecified fall, initial encounter: Secondary | ICD-10-CM

## 2022-04-04 DIAGNOSIS — M25562 Pain in left knee: Secondary | ICD-10-CM | POA: Diagnosis not present

## 2022-04-04 DIAGNOSIS — S80212A Abrasion, left knee, initial encounter: Secondary | ICD-10-CM

## 2022-04-04 LAB — POCT URINALYSIS DIP (MANUAL ENTRY)
Bilirubin, UA: NEGATIVE
Glucose, UA: NEGATIVE mg/dL
Ketones, POC UA: NEGATIVE mg/dL
Leukocytes, UA: NEGATIVE
Nitrite, UA: POSITIVE — AB
Protein Ur, POC: NEGATIVE mg/dL
Spec Grav, UA: 1.01 (ref 1.010–1.025)
Urobilinogen, UA: 0.2 E.U./dL
pH, UA: 5.5 (ref 5.0–8.0)

## 2022-04-04 MED ORDER — CIPROFLOXACIN HCL 500 MG PO TABS: 500.0000 mg | ORAL_TABLET | Freq: Two times a day (BID) | ORAL | 0 refills | Status: DC

## 2022-04-04 NOTE — ED Provider Notes (Signed)
?[facility] ? ? ? ?CSN: [id number] ?Arrival date & time: 04/04/22  1330 ? ? ?  ? ?History   ?Chief Complaint ?Chief Complaint  ?Patient presents with  ? Foot Pain  ?  UTI and foot pain  ? ? ?HPI ?[name] is a 71 y.o. female.  ? ?Presenting today with 4-day history of dysuria, urinary urgency.  Denies fever, chills, nausea, vomiting, abdominal pain, pelvic pain, hematuria.  Not trying anything over-the-counter for symptoms.  History of urinary tract infections that feel similar.  Also states that she has left knee pain and an abrasion after tripping over her dog this morning going to the bathroom.  Certain angles that she lands her foot seem to shoot pains up into the leg but good range of motion, strength and no numbness or tingling.  Has been treating the abrasion with home wound care and bandages. ? ? ?Past Medical History:  ?Diagnosis Date  ? Allergic rhinitis   ? GERD (gastroesophageal reflux disease)   ? Heart murmur   ? History of pneumonia   ? Hyperlipidemia   ? PAD (peripheral artery disease) ([place]) 2017  ? Status post left distal superficial femoral to tibio-peroneal trunk bypass - Dr Kellie Simmering  ? ? ?Patient Active Problem List  ? Diagnosis Date Noted  ? PAD (peripheral artery disease) ([place]) 03/11/2016  ? Spinal stenosis of lumbar region 12/26/2015  ? Fever 06/05/2011  ? ? ?Past Surgical History:  ?Procedure Laterality Date  ? ABDOMINAL AORTOGRAM W/LOWER EXTREMITY N/A 09/23/2017  ? Procedure: ABDOMINAL AORTOGRAM W/LOWER EXTREMITY;  Surgeon: Waynetta Sandy, MD;  Location: [place] CV LAB;  Service: Cardiovascular;  Laterality: N/A;  ? ABDOMINAL HYSTERECTOMY    ? CHOLECYSTECTOMY    ? ENDARTERECTOMY FEMORAL Left 04/30/2016  ? Procedure: Endarterectomy of Left popliteal, anterior tibial and tibio-peroneal trunk;  Surgeon: Mal Misty, MD;  Location: [place];  Service: Vascular;  Laterality: Left;  ? FEMORAL-POPLITEAL BYPASS GRAFT Left 04/30/2016  ? Procedure: Left Distal Superfical  Femoral to Tibio-peroneal trunk bypass using non-reversed saphenous vein graph Left Leg;  Surgeon: Mal Misty, MD;  Location: [place];  Service: Vascular;  Laterality: Left;  ? INTRAOPERATIVE ARTERIOGRAM Left 04/30/2016  ? Procedure: Left Leg INTRA OPERATIVE ARTERIOGRAM;  Surgeon: Mal Misty, MD;  Location: [place];  Service: Vascular;  Laterality: Left;  ? LUMBAR LAMINECTOMY/DECOMPRESSION MICRODISCECTOMY Left 12/26/2015  ? Procedure: LUMBAR LAMINECTOMY/DECOMPRESSION MICRODISCECTOMY 1 LEVEL L4-5 ON LEFT ;  Surgeon: Susa Day, MD;  Location: [facility];  Service: Orthopedics;  Laterality: Left;  ? OOPHORECTOMY Right   ? PERIPHERAL VASCULAR CATHETERIZATION N/A 03/25/2016  ? Procedure: Abdominal Aortogram w/Lower Extremity;  Surgeon: Serafina Mitchell, MD;  Location: [place] CV LAB;  Service: Cardiovascular;  Laterality: N/A;  ? PERIPHERAL VASCULAR CATHETERIZATION N/A 08/20/2016  ? Procedure: Abdominal Aortogram w/Lower Extremity;  Surgeon: Waynetta Sandy, MD;  Location: [place] CV LAB;  Service: Cardiovascular;  Laterality: N/A;  ? PERIPHERAL VASCULAR CATHETERIZATION Left 08/20/2016  ? Procedure: Peripheral Vascular Balloon Angioplasty;  Surgeon: Waynetta Sandy, MD;  Location: [place] CV LAB;  Service: Cardiovascular;  Laterality: Left;  Superficial femoral  ? ? ?OB History   ?No obstetric history on file. ?  ? ? ? ?Home Medications   ? ?Prior to Admission medications   ?Medication Sig Start Date End Date Taking? Authorizing Provider  ?ciprofloxacin (CIPRO) 500 MG tablet Take 1 tablet (500 mg total) by mouth 2 (two) times daily. 04/04/22  Yes  Volney American, PA-C  ?acetaminophen (TYLENOL) 325 MG tablet Take 650 mg by mouth every 6 (six) hours as needed for mild pain or moderate pain.    [provider]  ?amoxicillin (AMOXIL) 875 MG tablet Take 1 tablet (875 mg total) by mouth 2 (two) times daily. ?Patient not taking: Reported on 03/06/2022 02/06/22   Jaynee Eagles, PA-C  ?aspirin  EC 81 MG tablet Take 81 mg by mouth daily.    [provider]  ?levocetirizine (XYZAL) 5 MG tablet Take 5 mg by mouth every evening.     [provider]  ?metoprolol succinate (TOPROL XL) 25 MG 24 hr tablet Take 1 tablet (25 mg total) by mouth daily. 03/20/22   Satira Sark, MD  ?montelukast (SINGULAIR) 10 MG tablet Take 10 mg by mouth at bedtime.    [provider]  ?pantoprazole (PROTONIX) 40 MG tablet Take 40 mg by mouth daily before breakfast.     [provider]  ?predniSONE (DELTASONE) 10 MG tablet Take 3 tablets (30 mg total) by mouth daily with breakfast. ?Patient not taking: Reported on 03/06/2022 02/06/22   Jaynee Eagles, PA-C  ?rosuvastatin (CRESTOR) 40 MG tablet Take 1 tablet (40 mg total) by mouth daily. 03/07/22 06/05/22  Satira Sark, MD  ? ? ?Family History ?Family History  ?Problem Relation Age of Onset  ? Other Mother   ?     Brain tumor  ? CAD Father   ? Heart attack Father   ? ? ?Social History ?Social History  ? ?Tobacco Use  ? Smoking status: Every Day  ?  Packs/day: 0.50  ?  Years: 45.00  ?  Pack years: 22.50  ?  Types: Cigarettes  ?  Passive exposure: Never  ? Smokeless tobacco: Never  ? Tobacco comments:  ?  3 per day  ?Vaping Use  ? Vaping Use: Never used  ?Substance Use Topics  ? Alcohol use: No  ?  Alcohol/week: 0.0 standard drinks  ? Drug use: No  ? ? ? ?Allergies   ?Red dye ? ? ?Review of Systems ?Review of Systems ?Per HPI ? ?Physical Exam ?Triage Vital Signs ?ED Triage Vitals [04/04/22 1405]  ?Enc Vitals Group  ?   BP (!) 146/81  ?   Pulse Rate 90  ?   Resp 18  ?   Temp 97.9 ?F (36.6 ?C)  ?   Temp Source Oral  ?   SpO2 92 %  ?   Weight   ?   Height   ?   Head Circumference   ?   Peak Flow   ?   Pain Score 10  ?   Pain Loc   ?   Pain Edu?   ?   Excl. in [place]?   ? ?No data found. ? ?Updated Vital Signs ?BP (!) 146/81 (BP Location: Right Arm)   Pulse 90   Temp 97.9 ?F (36.6 ?C) (Oral)   Resp 18   SpO2 92%  ? ?Visual Acuity ?Right Eye Distance:    ?Left Eye Distance:   ?Bilateral Distance:   ? ?Right Eye Near:   ?Left Eye Near:    ?Bilateral Near:    ? ?Physical Exam ?Vitals and nursing note reviewed.  ?Constitutional:   ?   Appearance: Normal appearance. She is not ill-appearing.  ?HENT:  ?   Head: Atraumatic.  ?Eyes:  ?   Extraocular Movements: Extraocular movements intact.  ?   Conjunctiva/sclera: Conjunctivae normal.  ?Cardiovascular:  ?  Rate and Rhythm: Normal rate and regular rhythm.  ?   Heart sounds: Normal heart sounds.  ?Pulmonary:  ?   Effort: Pulmonary effort is normal.  ?   Breath sounds: Normal breath sounds.  ?Abdominal:  ?   General: Bowel sounds are normal. There is no distension.  ?   Palpations: Abdomen is soft.  ?   Tenderness: There is no abdominal tenderness. There is no right CVA tenderness, left CVA tenderness or guarding.  ?Musculoskeletal:     ?   General: Tenderness and signs of injury present. No swelling or deformity. Normal range of motion.  ?   Cervical back: Normal range of motion and neck supple.  ?   Comments: Medial and anterior left knee tender to palpation, no deformity, range of motion full and intact, strength full and intact, gait normal  ?Skin: ?   General: Skin is warm and dry.  ?   Comments: Superficial abrasion to anterior left knee  ?Neurological:  ?   Mental Status: She is alert and oriented to person, place, and time.  ?   Comments: Left lower extremity neurovascularly intact  ?Psychiatric:     ?   Mood and Affect: Mood normal.     ?   Thought Content: Thought content normal.     ?   Judgment: Judgment normal.  ? ? ? ?UC Treatments / Results  ?Labs ?(all labs ordered are listed, but only abnormal results are displayed) ?Labs Reviewed  ?POCT URINALYSIS DIP (MANUAL ENTRY) - Abnormal; Notable for the following components:  ?    Result Value  ? Blood, UA small (*)   ? Nitrite, UA Positive (*)   ? All other components within normal limits  ?URINE CULTURE  ? ? ?EKG ? ? ?Radiology ?No results  found. ? ?Procedures ?Procedures (including critical care time) ? ?Medications Ordered in UC ?Medications - No data to display ? ?Initial Impression / Assessment and Plan / UC Course  ?I have reviewed the triage vital signs and the nurs

## 2022-04-04 NOTE — ED Triage Notes (Signed)
Pt states she has been fighting a UTI for about 4 days  ? ?Pt states that this morning she fell over her dog and injured her left leg from the knee down to her left foot ? ?Denies Fever ? ? ?

## 2022-04-06 LAB — URINE CULTURE: Culture: <10000 — AB

## 2022-04-15 DIAGNOSIS — M7918 Myalgia, other site: Secondary | ICD-10-CM | POA: Insufficient documentation

## 2022-04-15 DIAGNOSIS — G5702 Lesion of sciatic nerve, left lower limb: Secondary | ICD-10-CM | POA: Insufficient documentation

## 2022-05-24 ENCOUNTER — Ambulatory Visit
Admission: EM | Admit: 2022-05-24 | Discharge: 2022-05-24 | Disposition: A | Discharge status: Home / Self Care | Payer: PPO

## 2022-05-24 DIAGNOSIS — J0191 Acute recurrent sinusitis, unspecified: Secondary | ICD-10-CM

## 2022-05-24 DIAGNOSIS — H6593 Unspecified nonsuppurative otitis media, bilateral: Secondary | ICD-10-CM

## 2022-05-24 DIAGNOSIS — J309 Allergic rhinitis, unspecified: Secondary | ICD-10-CM

## 2022-05-24 MED ORDER — BENZONATATE 100 MG PO CAPS: 100.0000 mg | ORAL_CAPSULE | Freq: Three times a day (TID) | ORAL | 0 refills | Status: DC | PRN

## 2022-05-24 MED ORDER — PREDNISONE 10 MG PO TABS: 30.0000 mg | ORAL_TABLET | Freq: Every day | ORAL | 0 refills | Status: AC

## 2022-05-24 MED ORDER — AMOXICILLIN-POT CLAVULANATE 875-125 MG PO TABS: 1.0000 | ORAL_TABLET | Freq: Two times a day (BID) | ORAL | 0 refills | Status: DC

## 2022-05-24 NOTE — ED Triage Notes (Signed)
Pt reports nasal congestion and sinus pressure x 1 week. Sudafed and Flonase gives some relief.

## 2022-05-24 NOTE — Discharge Instructions (Addendum)
Take medication as prescribed. Continue your current allergy medications. Recommend using normal saline nasal spray for awhile instead of Flonase to prevent worsening nosebleeds. Increase fluids and allow for plenty of rest. May take Ibuprofen or Tylenol as needed for pain or fever. Follow-up if symptoms do not improve.

## 2022-05-24 NOTE — ED Provider Notes (Signed)
[facility]    CSN: [id number] Arrival date & time: 05/24/22  0807      History   Chief Complaint Chief Complaint  Patient presents with   Nasal Congestion    HPI [name] is a 71 y.o. female.   [name] is a 71 y.o. female presenting for 1-week history of recurrent sinus pressure, sinus congestion, bilateral ear fullness, and coughing.  Patient has a history of persistent allergic rhinitis. She has been taking Sudafed, singulair and Flonase for her symptoms. Patient reports history of chronic sinusitis.  Denies fever, chills, headaches, sore throat, chest pain, shortness of breath, wheezing or GI symptoms. She is a current smoker.   The history is provided by the patient.   Past Medical History:  Diagnosis Date   Allergic rhinitis    GERD (gastroesophageal reflux disease)    Heart murmur    History of pneumonia    Hyperlipidemia    PAD (peripheral artery disease) (HCC) 2017   Status post left distal superficial femoral to tibio-peroneal trunk bypass - Dr Hart [place]    Patient Active Problem List   Diagnosis Date Noted   PAD (peripheral artery disease) (HCC) 03/11/2016   Spinal stenosis of lumbar region 12/26/2015   Fever 06/05/2011    Past Surgical History:  Procedure Laterality Date   ABDOMINAL AORTOGRAM W/LOWER EXTREMITY N/A 09/23/2017   Procedure: ABDOMINAL AORTOGRAM W/LOWER EXTREMITY;  Surgeon: Maeola Harman, MD;  Location: [facility] INVASIVE CV LAB;  Service: Cardiovascular;  Laterality: N/A;   ABDOMINAL HYSTERECTOMY     CHOLECYSTECTOMY     ENDARTERECTOMY FEMORAL Left 04/30/2016   Procedure: Endarterectomy of Left popliteal, anterior tibial and tibio-peroneal trunk;  Surgeon: Pryor Ochoa, MD;  Location: [facility] OR;  Service: Vascular;  Laterality: Left;   FEMORAL-POPLITEAL BYPASS GRAFT Left 04/30/2016   Procedure: Left Distal Superfical Femoral to Tibio-peroneal trunk bypass using non-reversed saphenous vein graph Left Leg;  Surgeon: Pryor Ochoa, MD;  Location: [facility] OR;  Service: Vascular;  Laterality: Left;   INTRAOPERATIVE ARTERIOGRAM Left 04/30/2016   Procedure: Left Leg INTRA OPERATIVE ARTERIOGRAM;  Surgeon: Pryor Ochoa, MD;  Location: [facility] OR;  Service: Vascular;  Laterality: Left;   LUMBAR LAMINECTOMY/DECOMPRESSION MICRODISCECTOMY Left 12/26/2015   Procedure: LUMBAR LAMINECTOMY/DECOMPRESSION MICRODISCECTOMY 1 LEVEL L4-5 ON LEFT ;  Surgeon: Jene Every, MD;  Location: [facility] ORS;  Service: Orthopedics;  Laterality: Left;   OOPHORECTOMY Right    PERIPHERAL VASCULAR CATHETERIZATION N/A 03/25/2016   Procedure: Abdominal Aortogram w/Lower Extremity;  Surgeon: Nada Libman, MD;  Location: [facility] INVASIVE CV LAB;  Service: Cardiovascular;  Laterality: N/A;   PERIPHERAL VASCULAR CATHETERIZATION N/A 08/20/2016   Procedure: Abdominal Aortogram w/Lower Extremity;  Surgeon: Maeola Harman, MD;  Location: [facility] INVASIVE CV LAB;  Service: Cardiovascular;  Laterality: N/A;   PERIPHERAL VASCULAR CATHETERIZATION Left 08/20/2016   Procedure: Peripheral Vascular Balloon Angioplasty;  Surgeon: Maeola Harman, MD;  Location: [facility] INVASIVE CV LAB;  Service: Cardiovascular;  Laterality: Left;  Superficial femoral    OB History   No obstetric history on file.      Home Medications    Prior to Admission medications   Medication Sig Start Date End Date Taking? Authorizing Provider  amoxicillin-clavulanate (AUGMENTIN) 875-125 MG tablet Take 1 tablet by mouth every 12 (twelve) hours. 05/24/22  Yes Clotilde Loth-Warren, Sadie Haber, NP  benzonatate (TESSALON PERLES) 100 MG capsule Take 1 capsule (100 mg total) by mouth 3 (three) times daily as needed for cough. 05/24/22  Yes Nirvaan Frett-Warren, Sadie Haberhristie J, NP  fluticasone (FLONASE) 50 MCG/ACT nasal spray Place into both nostrils daily.   Yes [provider]  predniSONE (DELTASONE) 10 MG tablet Take 3 tablets (30 mg total) by mouth daily with breakfast for 5 days. 05/24/22 05/29/22 Yes Laqueena Hinchey-Warren,  Sadie Haberhristie J, NP  pseudoephedrine (SUDAFED) 60 MG tablet Take 60 mg by mouth every 4 (four) hours as needed for congestion.   Yes [provider]  acetaminophen (TYLENOL) 325 MG tablet Take 650 mg by mouth every 6 (six) hours as needed for mild pain or moderate pain.    [provider]  amoxicillin (AMOXIL) 875 MG tablet Take 1 tablet (875 mg total) by mouth 2 (two) times daily. Patient not taking: Reported on 03/06/2022 02/06/22   Wallis BambergMani, Mario, PA-C  aspirin EC 81 MG tablet Take 81 mg by mouth daily.    [provider]  ciprofloxacin (CIPRO) 500 MG tablet Take 1 tablet (500 mg total) by mouth 2 (two) times daily. 04/04/22   Particia NearingLane, Rachel Elizabeth, PA-C  levocetirizine (XYZAL) 5 MG tablet Take 5 mg by mouth every evening.     [provider]  metoprolol succinate (TOPROL XL) 25 MG 24 hr tablet Take 1 tablet (25 mg total) by mouth daily. 03/20/22   Jonelle SidleMcDowell, Samuel G, MD  montelukast (SINGULAIR) 10 MG tablet Take 10 mg by mouth at bedtime.    [provider]  pantoprazole (PROTONIX) 40 MG tablet Take 40 mg by mouth daily before breakfast.     [provider]  rosuvastatin (CRESTOR) 40 MG tablet Take 1 tablet (40 mg total) by mouth daily. 03/07/22 06/05/22  Jonelle SidleMcDowell, Samuel G, MD    Family History Family History  Problem Relation Age of Onset   Other Mother        Brain tumor   CAD Father    Heart attack Father     Social History Social History   Tobacco Use   Smoking status: Every Day    Packs/day: 0.50    Years: 45.00    Pack years: 22.50    Types: Cigarettes    Passive exposure: Never   Smokeless tobacco: Never   Tobacco comments:    3 per day  Vaping Use   Vaping Use: Never used  Substance Use Topics   Alcohol use: No    Alcohol/week: 0.0 standard drinks   Drug use: No     Allergies   Red dye   Review of Systems Review of Systems PER HPI  Physical Exam Triage Vital Signs ED Triage Vitals [05/24/22 0815]  Enc Vitals  Group     BP (!) 160/79     Pulse Rate 86     Resp 18     Temp 98.1 F (36.7 C)     Temp Source Oral     SpO2 96 %     Weight      Height      Head Circumference      Peak Flow      Pain Score 0     Pain Loc      Pain Edu?      Excl. in GC?    No data found.  Updated Vital Signs BP (!) 160/79 (BP Location: Right Arm)   Pulse 86   Temp 98.1 F (36.7 C) (Oral)   Resp 18   SpO2 96%   Visual Acuity Right Eye Distance:   Left Eye Distance:   Bilateral Distance:  Right Eye Near:   Left Eye Near:    Bilateral Near:     Physical Exam Constitutional:      General: She is not in acute distress.    Appearance: Normal appearance. She is well-developed.  HENT:     Head: Normocephalic and atraumatic.     Right Ear: Ear canal and external ear normal. A middle ear effusion is present.     Left Ear: Ear canal and external ear normal. A middle ear effusion is present.     Nose: Congestion present.     Right Turbinates: Enlarged and swollen.     Left Turbinates: Enlarged and swollen.     Right Sinus: No maxillary sinus tenderness or frontal sinus tenderness.     Left Sinus: No maxillary sinus tenderness or frontal sinus tenderness.     Mouth/Throat:     Mouth: Mucous membranes are moist.  Eyes:     Conjunctiva/sclera: Conjunctivae normal.     Pupils: Pupils are equal, round, and reactive to light.  Neck:     Thyroid: No thyromegaly.     Trachea: No tracheal deviation.  Cardiovascular:     Rate and Rhythm: Normal rate and regular rhythm.     Heart sounds: Normal heart sounds.  Pulmonary:     Effort: Pulmonary effort is normal.     Breath sounds: Normal breath sounds.  Abdominal:     General: Bowel sounds are normal. There is no distension.     Palpations: Abdomen is soft.     Tenderness: There is no abdominal tenderness.  Musculoskeletal:     Cervical back: Normal range of motion and neck supple.  Skin:    General: Skin is warm and dry.  Neurological:      General: No focal deficit present.     Mental Status: She is alert and oriented to person, place, and time.  Psychiatric:        Mood and Affect: Mood normal.        Behavior: Behavior normal.        Thought Content: Thought content normal.        Judgment: Judgment normal.     UC Treatments / Results  Labs (all labs ordered are listed, but only abnormal results are displayed) Labs Reviewed - No data to display  EKG   Radiology No results found.  Procedures Procedures (including critical care time)  Medications Ordered in UC Medications - No data to display  Initial Impression / Assessment and Plan / UC Course  I have reviewed the triage vital signs and the nursing notes.  Pertinent labs & imaging results that were available during my care of the patient were reviewed by me and considered in my medical decision making (see chart for details).  Will start empiric treatment with augmentin. Will also prescribe a short course of Prednisone for her persistent rhinitis and middle ear effusions. Patient advised to continued  current allergy medications otherwise. Recommend normal saline nasal spray to avoid future nosebleeds. Counseled patient on potential for adverse effects with medications prescribed/recommended today, ER and return-to-clinic precautions discussed, patient verbalized understanding.  Final Clinical Impressions(s) / UC Diagnoses   Final diagnoses:  Acute recurrent sinusitis, unspecified location  Allergic rhinitis, unspecified seasonality, unspecified trigger  Middle ear effusion, bilateral     Discharge Instructions      Take medication as prescribed. Continue your current allergy medications. Recommend using normal saline nasal spray for awhile instead of Flonase to prevent worsening nosebleeds. Increase  fluids and allow for plenty of rest. May take Ibuprofen or Tylenol as needed for pain or fever. Follow-up if symptoms do not improve.      ED  Prescriptions     Medication Sig Dispense Auth. Provider   amoxicillin-clavulanate (AUGMENTIN) 875-125 MG tablet Take 1 tablet by mouth every 12 (twelve) hours. 14 tablet Treyon Wymore-Warren, Sadie Haber, NP   predniSONE (DELTASONE) 10 MG tablet Take 3 tablets (30 mg total) by mouth daily with breakfast for 5 days. 15 tablet Natalyah Cummiskey-Warren, Sadie Haber, NP   benzonatate (TESSALON PERLES) 100 MG capsule Take 1 capsule (100 mg total) by mouth 3 (three) times daily as needed for cough. 30 capsule Alphonsus Doyel-Warren, Sadie Haber, NP      PDMP not reviewed this encounter.   Abran Cantor, NP 05/24/22 [phone]

## 2022-07-29 ENCOUNTER — Ambulatory Visit: Payer: Self-pay

## 2022-07-29 ENCOUNTER — Ambulatory Visit
Admission: EM | Admit: 2022-07-29 | Discharge: 2022-07-29 | Disposition: A | Discharge status: Home / Self Care | Payer: PPO | Attending: Nurse Practitioner | Admitting: Nurse Practitioner

## 2022-07-29 DIAGNOSIS — J0140 Acute pansinusitis, unspecified: Secondary | ICD-10-CM | POA: Diagnosis not present

## 2022-07-29 MED ORDER — IPRATROPIUM BROMIDE 0.03 % NA SOLN: 2.0000 | Freq: Two times a day (BID) | NASAL | 0 refills | Status: DC

## 2022-07-29 MED ORDER — CETIRIZINE HCL 10 MG PO TABS: 10.0000 mg | ORAL_TABLET | Freq: Every day | ORAL | 0 refills | Status: DC

## 2022-07-29 MED ORDER — DOXYCYCLINE HYCLATE 100 MG PO CAPS: 100.0000 mg | ORAL_CAPSULE | Freq: Two times a day (BID) | ORAL | 0 refills | Status: DC

## 2022-07-29 NOTE — Discharge Instructions (Addendum)
Take medication as directed. Continue Singulair at this time. Increase fluids and get plenty of rest. May take over-the-counter ibuprofen or Tylenol as needed for pain, fever, or general discomfort. Recommend normal saline nasal spray to help with nasal congestion throughout the day. For your cough, it may be helpful to use a humidifier at bedtime during sleep. If your symptoms fail to improve within the next 7 to 10 days, please follow-up in our clinic.  If you continue to have recurrent sinus symptoms, please consider follow-up with ear nose and throat for further evaluation.  May also consider follow-up with an allergist.

## 2022-07-29 NOTE — ED Triage Notes (Signed)
Pt reports nasal congestion x 1 week; headache x 3 days. Sudafed and Tylenol gives no relief.

## 2022-07-29 NOTE — ED Provider Notes (Addendum)
[facility]    CSN: [id number] Arrival date & time: 07/29/22  0905      History   Chief Complaint Chief Complaint  Patient presents with   Nasal Congestion        Headache    HPI [name] is a 71 y.o. female.   The history is provided by the patient.   [name] is a 71 y.o. female presenting for 1-week history of recurrent sinus pressure, sinus congestion, and headache. Patient  has a history of persistent allergic rhinitis, she was recently treated for sinusitis approximately 2 months ago. She has been taking Sudafed, singulair and Nasacort for her symptoms.  Denies fever, chills, sore throat, chest pain, shortness of breath, wheezing or GI symptoms. She is a current smoker.   Past Medical History:  Diagnosis Date   Allergic rhinitis    GERD (gastroesophageal reflux disease)    Heart murmur    History of pneumonia    Hyperlipidemia    PAD (peripheral artery disease) (HCC) 2017   Status post left distal superficial femoral to tibio-peroneal trunk bypass - Dr Hart [place]    Patient Active Problem List   Diagnosis Date Noted   PAD (peripheral artery disease) (HCC) 03/11/2016   Spinal stenosis of lumbar region 12/26/2015   Fever 06/05/2011    Past Surgical History:  Procedure Laterality Date   ABDOMINAL AORTOGRAM W/LOWER EXTREMITY N/A 09/23/2017   Procedure: ABDOMINAL AORTOGRAM W/LOWER EXTREMITY;  Surgeon: Maeola Harman, MD;  Location: [facility] INVASIVE CV LAB;  Service: Cardiovascular;  Laterality: N/A;   ABDOMINAL HYSTERECTOMY     CHOLECYSTECTOMY     ENDARTERECTOMY FEMORAL Left 04/30/2016   Procedure: Endarterectomy of Left popliteal, anterior tibial and tibio-peroneal trunk;  Surgeon: Pryor Ochoa, MD;  Location: [facility] OR;  Service: Vascular;  Laterality: Left;   FEMORAL-POPLITEAL BYPASS GRAFT Left 04/30/2016   Procedure: Left Distal Superfical Femoral to Tibio-peroneal trunk bypass using non-reversed saphenous vein graph Left Leg;  Surgeon:  Pryor Ochoa, MD;  Location: [facility] OR;  Service: Vascular;  Laterality: Left;   INTRAOPERATIVE ARTERIOGRAM Left 04/30/2016   Procedure: Left Leg INTRA OPERATIVE ARTERIOGRAM;  Surgeon: Pryor Ochoa, MD;  Location: [facility] OR;  Service: Vascular;  Laterality: Left;   LUMBAR LAMINECTOMY/DECOMPRESSION MICRODISCECTOMY Left 12/26/2015   Procedure: LUMBAR LAMINECTOMY/DECOMPRESSION MICRODISCECTOMY 1 LEVEL L4-5 ON LEFT ;  Surgeon: Jene Every, MD;  Location: [facility] ORS;  Service: Orthopedics;  Laterality: Left;   OOPHORECTOMY Right    PERIPHERAL VASCULAR CATHETERIZATION N/A 03/25/2016   Procedure: Abdominal Aortogram w/Lower Extremity;  Surgeon: Nada Libman, MD;  Location: [facility] INVASIVE CV LAB;  Service: Cardiovascular;  Laterality: N/A;   PERIPHERAL VASCULAR CATHETERIZATION N/A 08/20/2016   Procedure: Abdominal Aortogram w/Lower Extremity;  Surgeon: Maeola Harman, MD;  Location: [facility] INVASIVE CV LAB;  Service: Cardiovascular;  Laterality: N/A;   PERIPHERAL VASCULAR CATHETERIZATION Left 08/20/2016   Procedure: Peripheral Vascular Balloon Angioplasty;  Surgeon: Maeola Harman, MD;  Location: [facility] INVASIVE CV LAB;  Service: Cardiovascular;  Laterality: Left;  Superficial femoral    OB History   No obstetric history on file.      Home Medications    Prior to Admission medications   Medication Sig Start Date End Date Taking? Authorizing Provider  cetirizine (ZYRTEC) 10 MG tablet Take 1 tablet (10 mg total) by mouth daily. 07/29/22  Yes Shamia Uppal-Warren, Sadie Haber, NP  doxycycline (VIBRAMYCIN) 100 MG capsule Take 1 capsule (100 mg total) by mouth 2 (two) times  daily. 07/29/22  Yes Zerick Prevette-Warren, Alda Lea, NP  ipratropium (ATROVENT) 0.03 % nasal spray Place 2 sprays into both nostrils every 12 (twelve) hours. 07/29/22  Yes Daysha Ashmore-Warren, Alda Lea, NP  acetaminophen (TYLENOL) 325 MG tablet Take 650 mg by mouth every 6 (six) hours as needed for mild pain or moderate pain.    [provider]   amoxicillin (AMOXIL) 875 MG tablet Take 1 tablet (875 mg total) by mouth 2 (two) times daily. Patient not taking: Reported on 03/06/2022 02/06/22   Jaynee Eagles, PA-C  amoxicillin-clavulanate (AUGMENTIN) 875-125 MG tablet Take 1 tablet by mouth every 12 (twelve) hours. 05/24/22   Nichola Warren-Warren, Alda Lea, NP  aspirin EC 81 MG tablet Take 81 mg by mouth daily.    [provider]  benzonatate (TESSALON PERLES) 100 MG capsule Take 1 capsule (100 mg total) by mouth 3 (three) times daily as needed for cough. 05/24/22   Rosalind Guido-Warren, Alda Lea, NP  ciprofloxacin (CIPRO) 500 MG tablet Take 1 tablet (500 mg total) by mouth 2 (two) times daily. 04/04/22   Volney American, PA-C  fluticasone [facility]) 50 MCG/ACT nasal spray Place into both nostrils daily.    [provider]  levocetirizine (XYZAL) 5 MG tablet Take 5 mg by mouth every evening.     [provider]  metoprolol succinate (TOPROL XL) 25 MG 24 hr tablet Take 1 tablet (25 mg total) by mouth daily. 03/20/22   Satira Sark, MD  montelukast (SINGULAIR) 10 MG tablet Take 10 mg by mouth at bedtime.    [provider]  pantoprazole (PROTONIX) 40 MG tablet Take 40 mg by mouth daily before breakfast.     [provider]  pseudoephedrine (SUDAFED) 60 MG tablet Take 60 mg by mouth every 4 (four) hours as needed for congestion.    [provider]  rosuvastatin (CRESTOR) 40 MG tablet Take 1 tablet (40 mg total) by mouth daily. 03/07/22 06/05/22  Satira Sark, MD    Family History Family History  Problem Relation Age of Onset   Other Mother        Brain tumor   CAD Father    Heart attack Father     Social History Social History   Tobacco Use   Smoking status: Every Day    Packs/day: 0.50    Years: 45.00    Total pack years: 22.50    Types: Cigarettes    Passive exposure: Never   Smokeless tobacco: Never   Tobacco comments:    3 per day  Vaping Use   Vaping Use: Never used   Substance Use Topics   Alcohol use: No    Alcohol/week: 0.0 standard drinks of alcohol   Drug use: No     Allergies   Dust mite extract, Grass pollen([id number] vern), Mixed grasses, and Red dye   Review of Systems Review of Systems Per HPI  Physical Exam Triage Vital Signs ED Triage Vitals  Enc Vitals Group     BP 07/29/22 0940 127/72     Pulse Rate 07/29/22 0940 71     Resp 07/29/22 0940 16     Temp 07/29/22 0940 98.1 F (36.7 C)     Temp Source 07/29/22 0940 Oral     SpO2 07/29/22 0940 95 %     Weight --      Height --      Head Circumference --      Peak Flow --      Pain Score 07/29/22  4854 7     Pain Loc --      Pain Edu? --      Excl. in GC? --    No data found.  Updated Vital Signs BP 127/72 (BP Location: Right Arm)   Pulse 71   Temp 98.1 F (36.7 C) (Oral)   Resp 16   SpO2 95%   Visual Acuity Right Eye Distance:   Left Eye Distance:   Bilateral Distance:    Right Eye Near:   Left Eye Near:    Bilateral Near:     Physical Exam Vitals and nursing note reviewed.  Constitutional:      General: She is not in acute distress.    Appearance: She is well-developed.  HENT:     Head: Normocephalic.     Right Ear: Tympanic membrane, ear canal and external ear normal.     Left Ear: Tympanic membrane, ear canal and external ear normal.     Nose: Congestion present.     Right Turbinates: Enlarged and swollen.     Left Turbinates: Enlarged and swollen.     Right Sinus: Maxillary sinus tenderness and frontal sinus tenderness present.     Left Sinus: Maxillary sinus tenderness and frontal sinus tenderness present.     Mouth/Throat:     Lips: Pink.     Mouth: Mucous membranes are moist.     Pharynx: Oropharynx is clear. Uvula midline.     Tonsils: No tonsillar exudate.  Eyes:     Extraocular Movements: Extraocular movements intact.     Pupils: Pupils are equal, round, and reactive to light.  Cardiovascular:     Rate and Rhythm: Normal rate and  regular rhythm.  Pulmonary:     Effort: Pulmonary effort is normal.     Breath sounds: Normal breath sounds.  Abdominal:     General: Bowel sounds are normal.     Palpations: Abdomen is soft.  Musculoskeletal:     Cervical back: Normal range of motion.  Lymphadenopathy:     Cervical: No cervical adenopathy.  Skin:    General: Skin is warm and dry.  Neurological:     General: No focal deficit present.     Mental Status: She is alert and oriented to person, place, and time.     GCS: GCS eye subscore is 4. GCS verbal subscore is 5. GCS motor subscore is 6.  Psychiatric:        Mood and Affect: Mood normal.        Speech: Speech normal.        Behavior: Behavior normal.      UC Treatments / Results  Labs (all labs ordered are listed, but only abnormal results are displayed) Labs Reviewed - No data to display  EKG   Radiology No results found.  Procedures Procedures (including critical care time)  Medications Ordered in UC Medications - No data to display  Initial Impression / Assessment and Plan / UC Course  I have reviewed the triage vital signs and the nursing notes.  Pertinent labs & imaging results that were available during my care of the patient were reviewed by me and considered in my medical decision making (see chart for details).  Patient with moderate sinus tenderness and pressure on exam.  Symptoms are consistent with acute pansinusitis.  Given the duration of her symptoms and physical findings on exam, will start patient on doxycycline.  COVID/flu testing is not indicated based on the patient's duration of symptoms, nor  will this change the plan of treatment at this time.  Discussion with patient regarding recurrent sinusitis.  Advised patient that she may need to consider changing her allergy medication regimen to a daily dosing versus as needed.  Will prescribe ipratropium bromide nasal spray to help with her nasal congestion and cetirizine for daily use.   Patient was advised to continue use of the Singulair that she is currently taking.  Also recommend normal saline nasal spray to avoid future nosebleeds. Counseled patient on potential for adverse effects with medications prescribed/recommended today, ER and return-to-clinic precautions discussed, patient verbalized understanding.  Final Clinical Impressions(s) / UC Diagnoses   Final diagnoses:  Acute pansinusitis, recurrence not specified     Discharge Instructions      Take medication as directed. Continue Singulair at this time. Increase fluids and get plenty of rest. May take over-the-counter ibuprofen or Tylenol as needed for pain, fever, or general discomfort. Recommend normal saline nasal spray to help with nasal congestion throughout the day. For your cough, it may be helpful to use a humidifier at bedtime during sleep. If your symptoms fail to improve within the next 7 to 10 days, please follow-up in our clinic.  If you continue to have recurrent sinus symptoms, please consider follow-up with ear nose and throat for further evaluation.  May also consider follow-up with an allergist.     ED Prescriptions     Medication Sig Dispense Auth. Provider   doxycycline (VIBRAMYCIN) 100 MG capsule Take 1 capsule (100 mg total) by mouth 2 (two) times daily. 20 capsule Chester Sibert-Warren, Alda Lea, NP   ipratropium (ATROVENT) 0.03 % nasal spray Place 2 sprays into both nostrils every 12 (twelve) hours. 30 mL Tyquisha Sharps-Warren, Alda Lea, NP   cetirizine (ZYRTEC) 10 MG tablet Take 1 tablet (10 mg total) by mouth daily. 30 tablet Joud Ingwersen-Warren, Alda Lea, NP      PDMP not reviewed this encounter.   Tish Men, NP 07/29/22 1013    Rashanda Magloire-Warren, Alda Lea, NP 07/29/22 1015

## 2022-08-14 DIAGNOSIS — G90529 Complex regional pain syndrome I of unspecified lower limb: Secondary | ICD-10-CM | POA: Insufficient documentation

## 2022-09-14 NOTE — Progress Notes (Unsigned)
Cardiology Office Note  Date: 09/15/2022   ID: [name], DOB 01-21-1951, MRN [id number]  PCP:  Elfredia Nevins, MD  Cardiologist:  Nona Dell, MD Electrophysiologist:  None   Chief Complaint  Patient presents with   Cardiac follow-up    History of Present Illness: [name] is a 71 y.o. female last seen in March.  She is here today for a follow-up visit.  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  She does not report any recurring chest pain, no palpitations or dizziness.  I reviewed her medications which are outlined below.  We have uptitrated Crestor to 40 mg daily for more aggressive LDL control.  She is due for a follow-up fasting lipid profile.  Past Medical History:  Diagnosis Date   Allergic rhinitis    GERD (gastroesophageal reflux disease)    Heart murmur    History of pneumonia    Hyperlipidemia    PAD (peripheral artery disease) (HCC) 2017   Status post left distal superficial femoral to tibio-peroneal trunk bypass - Dr Hart Rochester    Past Surgical History:  Procedure Laterality Date   ABDOMINAL AORTOGRAM W/LOWER EXTREMITY N/A 09/23/2017   Procedure: ABDOMINAL AORTOGRAM W/LOWER EXTREMITY;  Surgeon: Maeola Harman, MD;  Location: [facility] INVASIVE CV LAB;  Service: Cardiovascular;  Laterality: N/A;   ABDOMINAL HYSTERECTOMY     CHOLECYSTECTOMY     ENDARTERECTOMY FEMORAL Left 04/30/2016   Procedure: Endarterectomy of Left popliteal, anterior tibial and tibio-peroneal trunk;  Surgeon: Pryor Ochoa, MD;  Location: [facility] OR;  Service: Vascular;  Laterality: Left;   FEMORAL-POPLITEAL BYPASS GRAFT Left 04/30/2016   Procedure: Left Distal Superfical Femoral to Tibio-peroneal trunk bypass using non-reversed saphenous vein graph Left Leg;  Surgeon: Pryor Ochoa, MD;  Location: [facility] OR;  Service: Vascular;  Laterality: Left;   INTRAOPERATIVE ARTERIOGRAM Left 04/30/2016   Procedure: Left Leg INTRA OPERATIVE  ARTERIOGRAM;  Surgeon: Pryor Ochoa, MD;  Location: [facility] OR;  Service: Vascular;  Laterality: Left;   LUMBAR LAMINECTOMY/DECOMPRESSION MICRODISCECTOMY Left 12/26/2015   Procedure: LUMBAR LAMINECTOMY/DECOMPRESSION MICRODISCECTOMY 1 LEVEL L4-5 ON LEFT ;  Surgeon: Jene Every, MD;  Location: [facility] ORS;  Service: Orthopedics;  Laterality: Left;   OOPHORECTOMY Right    PERIPHERAL VASCULAR CATHETERIZATION N/A 03/25/2016   Procedure: Abdominal Aortogram w/Lower Extremity;  Surgeon: Nada Libman, MD;  Location: [facility] INVASIVE CV LAB;  Service: Cardiovascular;  Laterality: N/A;   PERIPHERAL VASCULAR CATHETERIZATION N/A 08/20/2016   Procedure: Abdominal Aortogram w/Lower Extremity;  Surgeon: Maeola Harman, MD;  Location: [facility] INVASIVE CV LAB;  Service: Cardiovascular;  Laterality: N/A;   PERIPHERAL VASCULAR CATHETERIZATION Left 08/20/2016   Procedure: Peripheral Vascular Balloon Angioplasty;  Surgeon: Maeola Harman, MD;  Location: [facility] INVASIVE CV LAB;  Service: Cardiovascular;  Laterality: Left;  Superficial femoral    Current Outpatient Medications  Medication Sig Dispense Refill   acetaminophen (TYLENOL) 325 MG tablet Take 650 mg by mouth every 6 (six) hours as needed for mild pain or moderate pain.     amoxicillin (AMOXIL) 875 MG tablet Take 1 tablet (875 mg total) by mouth 2 (two) times daily. 14 tablet 0   amoxicillin-clavulanate (AUGMENTIN) 875-125 MG tablet Take 1 tablet by mouth every 12 (twelve) hours. 14 tablet 0   aspirin EC 81 MG tablet Take 81 mg by mouth daily.     benzonatate (TESSALON PERLES) 100 MG capsule Take 1 capsule (100 mg total) by mouth 3 (three) times  daily as needed for cough. 30 capsule 0   cetirizine (ZYRTEC) 10 MG tablet Take 1 tablet (10 mg total) by mouth daily. 30 tablet 0   ciprofloxacin (CIPRO) 500 MG tablet Take 1 tablet (500 mg total) by mouth 2 (two) times daily. 10 tablet 0   doxycycline (VIBRAMYCIN) 100 MG capsule Take 1 capsule (100 mg total) by  mouth 2 (two) times daily. 20 capsule 0   fluticasone (FLONASE) 50 MCG/ACT nasal spray Place into both nostrils daily.     ipratropium (ATROVENT) 0.03 % nasal spray Place 2 sprays into both nostrils every 12 (twelve) hours. 30 mL 0   levocetirizine (XYZAL) 5 MG tablet Take 5 mg by mouth every evening.      metoprolol succinate (TOPROL XL) 25 MG 24 hr tablet Take 1 tablet (25 mg total) by mouth daily. 90 tablet 3   montelukast (SINGULAIR) 10 MG tablet Take 10 mg by mouth at bedtime.     pantoprazole (PROTONIX) 40 MG tablet Take 40 mg by mouth daily before breakfast.      pseudoephedrine (SUDAFED) 60 MG tablet Take 60 mg by mouth every 4 (four) hours as needed for congestion.     rosuvastatin (CRESTOR) 40 MG tablet Take 1 tablet (40 mg total) by mouth daily. 90 tablet 3   No current facility-administered medications for this visit.   Allergies:  Dust mite extract, Grass pollen([id number] vern), Mixed grasses, and Red dye   ROS: No orthopnea or PND.  Physical Exam: VS:  BP (!) 144/80   Pulse 80   Ht 5\' 3"  (1.6 m)   Wt 142 lb 6.4 oz (64.6 kg)   SpO2 99%   BMI 25.23 kg/m , BMI Body mass index is 25.23 kg/m.  Wt Readings from Last 3 Encounters:  09/15/22 142 lb 6.4 oz (64.6 kg)  03/06/22 153 lb (69.4 kg)  07/26/21 146 lb 3.2 oz (66.3 kg)    General: Patient appears comfortable at rest. HEENT: Conjunctiva and lids normal. Neck: Supple, no elevated JVP or carotid bruits, no thyromegaly. Lungs: Clear to auscultation, nonlabored breathing at rest. Cardiac: Regular rate and rhythm, no S3, 2/6 systolic murmur. Extremities: No pitting edema.  ECG:  An ECG dated 03/06/2022 was personally reviewed today and demonstrated:  Sinus rhythm.  Recent Labwork:    Component Value Date/Time   CHOL 159 03/07/2022 0946   TRIG 63 03/07/2022 0946   HDL 43 03/07/2022 0946   CHOLHDL 3.7 03/07/2022 0946   VLDL 13 03/07/2022 0946   LDLCALC 103 (H) 03/07/2022 0946    Other Studies Reviewed  Today:  Echocardiogram 04/19/2021:  1. Left ventricular ejection fraction, by estimation, is 65 to 70%. The  left ventricle has normal function. The left ventricle has no regional  wall motion abnormalities. Left ventricular diastolic parameters are  consistent with Grade I diastolic  dysfunction (impaired relaxation).   2. Right ventricular systolic function is normal. The right ventricular  size is normal. Tricuspid regurgitation signal is inadequate for assessing  PA pressure.   3. The mitral valve is abnormal, mildly thickened and moderately  calcified. Trivial mitral valve regurgitation. Moderate mitral annular  calcification.   4. The aortic valve is tricuspid. There is moderate calcification of the  aortic valve. Aortic valve regurgitation is not visualized. Mild to  moderate aortic valve sclerosis/calcification is present, without any  evidence of aortic stenosis.   5. The inferior vena cava is normal in size with greater than 50%  respiratory variability, suggesting right atrial  pressure of 3 mmHg.    Lexiscan Myoview 04/19/2021: No diagnostic ST segment changes indicate ischemia. Medium sized, moderate intensity, mid to basal inferoseptal defect that is partially reversible and consistent with ischemia. This is an intermediate risk study. Nuclear stress EF: 70%.  Assessment and Plan:  1.  Ischemic heart disease based on Myoview from April 2022.  No active angina symptoms and plan to continue medical therapy and observation for now.  She is on aspirin, Toprol-XL, and Crestor.  We have discussed further evaluation if symptoms escalate.  2.  Mixed hyperlipidemia, now on Crestor 40 mg daily.  Recheck FLP.  3.  Aortic valve sclerosis without stenosis by echocardiogram in April 2022.  Medication Adjustments/Labs and Tests Ordered: Current medicines are reviewed at length with the patient today.  Concerns regarding medicines are outlined above.   Tests Ordered: Orders Placed  This Encounter  Procedures   Lipid panel    Medication Changes: No orders of the defined types were placed in this encounter.   Disposition:  Follow up  6 months.  Signed, Satira Sark, MD, [facility] 09/15/2022 12:05 PM    [facility] [place] [facility] [place], [place], [place] [address] Phone: [phone]; Fax: [phone]

## 2022-09-15 ENCOUNTER — Ambulatory Visit: Payer: PPO | Attending: Cardiology | Admitting: Cardiology

## 2022-09-15 ENCOUNTER — Encounter: Payer: Self-pay | Admitting: Cardiology

## 2022-09-15 VITALS — BP 144/80 | HR 80 | Ht 63.0 in | Wt 142.4 lb

## 2022-09-15 DIAGNOSIS — E782 Mixed hyperlipidemia: Secondary | ICD-10-CM

## 2022-09-15 DIAGNOSIS — I259 Chronic ischemic heart disease, unspecified: Secondary | ICD-10-CM | POA: Diagnosis not present

## 2022-09-15 NOTE — Patient Instructions (Addendum)
Medication Instructions:  Your physician recommends that you continue on your current medications as directed. Please refer to the Current Medication list given to you today.   Labwork: Fasting lipid panel (2 weeks [facility])  Testing/Procedures: none  Follow-Up:  Your physician recommends that you schedule a follow-up appointment in: 6 months  Any Other Special Instructions Will Be Listed Below (If Applicable).  If you need a refill on your cardiac medications before your next appointment, please call your pharmacy.

## 2022-09-16 ENCOUNTER — Other Ambulatory Visit (HOSPITAL_COMMUNITY)
Admission: RE | Admit: 2022-09-16 | Discharge: 2022-09-16 | Disposition: A | Discharge status: Home / Self Care | Payer: PPO | Source: Ambulatory Visit | Attending: Cardiology | Admitting: Cardiology

## 2022-09-16 DIAGNOSIS — E782 Mixed hyperlipidemia: Secondary | ICD-10-CM | POA: Diagnosis present

## 2022-09-16 LAB — LIPID PANEL
Cholesterol: 157 mg/dL (ref 0–200)
HDL: 43 mg/dL (ref >40)
LDL Cholesterol: 94 mg/dL (ref 0–99)
Total CHOL/HDL Ratio: 3.7 RATIO
Triglycerides: 100 mg/dL (ref <150)
VLDL: 20 mg/dL (ref 0–40)

## 2023-03-19 ENCOUNTER — Ambulatory Visit: Payer: PPO | Attending: Cardiology | Admitting: Cardiology

## 2023-03-19 ENCOUNTER — Encounter: Payer: Self-pay | Admitting: Cardiology

## 2023-03-19 VITALS — BP 132/76 | HR 77 | Ht 62.0 in | Wt 152.0 lb

## 2023-03-19 DIAGNOSIS — I739 Peripheral vascular disease, unspecified: Secondary | ICD-10-CM

## 2023-03-19 DIAGNOSIS — E782 Mixed hyperlipidemia: Secondary | ICD-10-CM | POA: Diagnosis not present

## 2023-03-19 DIAGNOSIS — Z79899 Other long term (current) drug therapy: Secondary | ICD-10-CM

## 2023-03-19 DIAGNOSIS — I259 Chronic ischemic heart disease, unspecified: Secondary | ICD-10-CM

## 2023-03-19 NOTE — Progress Notes (Signed)
    Cardiology Office Note  Date: 03/19/2023   ID: [name], DOB [birth year], MRN [id number]  History of Present Illness: [name] is a 72 y.o. female last seen in September 2023.  She is here for a routine follow-up visit.  Reports no exertional chest pain or breathlessness beyond NYHA class II with typical activities.  No palpitations or syncope.  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  I reviewed her medications.  She has tolerated Crestor which we increased to 40 mg daily.  Plan to follow-up FLP.  Her last LDL had come down to 94.  She not had any regular follow-up with [facility], previously saw Dr. Donzetta Matters.  We discussed getting her set up for evaluation with Dr. Donnetta Hutching in [place].  She does report stable claudication.  Has not undergone ABIs since 2019.  ECG today shows normal sinus rhythm.  Physical Exam: VS:  BP 132/76   Pulse 77   Ht 5\' 2"  (1.575 m)   Wt 152 lb (68.9 kg)   SpO2 97%   BMI 27.80 kg/m , BMI Body mass index is 27.8 kg/m.  Wt Readings from Last 3 Encounters:  03/19/23 152 lb (68.9 kg)  09/15/22 142 lb 6.4 oz (64.6 kg)  03/06/22 153 lb (69.4 kg)    General: Patient appears comfortable at rest. HEENT: Conjunctiva and lids normal. Neck: Supple, no elevated JVP or carotid bruits. Lungs: Clear to auscultation, nonlabored breathing at rest. Cardiac: Regular rate and rhythm, no S3, 1/6 systolic murmur. Extremities: No pitting edema.  ECG:  An ECG dated 03/06/2022 was personally reviewed today and demonstrated:  Sinus rhythm.  Labwork:    Component Value Date/Time   CHOL 157 09/16/2022 0922   TRIG 100 09/16/2022 0922   HDL 43 09/16/2022 0922   CHOLHDL 3.7 09/16/2022 0922   VLDL 20 09/16/2022 0922   LDLCALC 94 09/16/2022 0922  December 2023: Hemoglobin 14, platelets 333, BUN 12, creatinine 0.6, potassium 4.9, AST 19, ALT 13, TSH 1.41  Other Studies Reviewed Today:  No interval cardiac testing for review today.  Assessment  and Plan:  1.  Ischemic heart disease based on Lexiscan Myoview from April 2022 showing mid to basal inferoseptal ischemia.  LVEF 65 to 70% by echocardiography at that time.  She has been managed medically in the absence of angina.  Reports no change in symptomatology since last visit.  ECG reviewed and stable.  Continue aspirin, Toprol-XL, and Crestor.  2.  Mixed hyperlipidemia, last LDL down to 94 in September 2023 on Crestor 40 mg daily.  Plan to recheck FLP as she may need further treatment considerations.  3.  PAD status post left distal superficial femoral to tibio-peroneal trunk bypass in 2017.  Get her reestablished with [facility], to see Dr. Donnetta Hutching in the [place] office.  4.  Aortic valve sclerosis without stenosis, last assessed by echocardiogram in April 2022.  No significant change in murmur.  Disposition:  Follow up  6 months.  Signed, Satira Sark, M.D., F.A.C.C.

## 2023-03-19 NOTE — Patient Instructions (Addendum)
Medication Instructions:  Your physician recommends that you continue on your current medications as directed. Please refer to the Current Medication list given to you today.  Labwork: Your physician recommends that you return for a FASTING lipid profile: Please do not eat or drink for at least 8 hours when you have this done. You may take your medications that morning with a sip of water. [facility]  Testing/Procedures: none  Follow-Up: Your physician recommends that you schedule a follow-up appointment in: 6 months  Any Other Special Instructions Will Be Listed Below (If Applicable). You have been referred to Vein and Vascular  If you need a refill on your cardiac medications before your next appointment, please call your pharmacy.

## 2023-03-21 ENCOUNTER — Other Ambulatory Visit: Payer: Self-pay | Admitting: Cardiology

## 2023-03-24 ENCOUNTER — Other Ambulatory Visit (HOSPITAL_COMMUNITY)
Admission: RE | Admit: 2023-03-24 | Discharge: 2023-03-24 | Disposition: A | Discharge status: Home / Self Care | Payer: PPO | Source: Ambulatory Visit | Attending: Cardiology | Admitting: Cardiology

## 2023-03-24 DIAGNOSIS — Z79899 Other long term (current) drug therapy: Secondary | ICD-10-CM | POA: Diagnosis present

## 2023-03-24 DIAGNOSIS — E782 Mixed hyperlipidemia: Secondary | ICD-10-CM | POA: Insufficient documentation

## 2023-03-24 LAB — LIPID PANEL
Cholesterol: 159 mg/dL (ref 0–200)
HDL: 50 mg/dL (ref >40)
LDL Cholesterol: 92 mg/dL (ref 0–99)
Total CHOL/HDL Ratio: 3.2 RATIO
Triglycerides: 85 mg/dL (ref <150)
VLDL: 17 mg/dL (ref 0–40)

## 2023-03-26 ENCOUNTER — Telehealth: Payer: Self-pay

## 2023-03-26 MED ORDER — EZETIMIBE 10 MG PO TABS: 10.0000 mg | ORAL_TABLET | Freq: Every day | ORAL | 1 refills | Status: DC

## 2023-03-26 NOTE — Telephone Encounter (Signed)
-----   Message from Satira Sark, MD sent at 03/24/2023 10:22 AM EDT ----- Results reviewed.  LDL relatively stable at 92, down from 94.  She is on high-dose Crestor already.  Would add Zetia 10 mg daily for now.

## 2023-03-30 ENCOUNTER — Other Ambulatory Visit: Payer: Self-pay | Admitting: Cardiology

## 2023-04-03 DIAGNOSIS — M542 Cervicalgia: Secondary | ICD-10-CM | POA: Insufficient documentation

## 2023-04-22 ENCOUNTER — Ambulatory Visit: Payer: PPO

## 2023-04-22 ENCOUNTER — Ambulatory Visit (INDEPENDENT_AMBULATORY_CARE_PROVIDER_SITE_OTHER): Payer: PPO

## 2023-04-22 ENCOUNTER — Other Ambulatory Visit: Payer: Self-pay

## 2023-04-22 ENCOUNTER — Encounter: Payer: Self-pay | Admitting: Vascular Surgery

## 2023-04-22 ENCOUNTER — Ambulatory Visit (INDEPENDENT_AMBULATORY_CARE_PROVIDER_SITE_OTHER): Payer: PPO | Admitting: Vascular Surgery

## 2023-04-22 VITALS — BP 158/88 | HR 78 | Temp 97.3°F | Ht 62.0 in | Wt 152.4 lb

## 2023-04-22 DIAGNOSIS — I739 Peripheral vascular disease, unspecified: Secondary | ICD-10-CM

## 2023-04-22 DIAGNOSIS — Z95828 Presence of other vascular implants and grafts: Secondary | ICD-10-CM | POA: Diagnosis not present

## 2023-04-22 LAB — VAS US ABI WITH/WO TBI
Left ABI: 0.85
Right ABI: 0.53

## 2023-04-22 NOTE — Progress Notes (Signed)
Vascular and Vein Specialist of   Patient name: [name] MRN: [id number] DOB: 26-Sep-1951 Sex: female  REASON FOR CONSULT: Follow-up known peripheral vascular occlusive disease  HPI: [name] is a 72 y.o. female, who is here today for follow-up.  She is here today with her husband.  She had undergone a left distal superficial femoral artery to tibioperoneal trunk bypass with Dr. Hart Rochester on 04/30/2016.  Had angioplasty of her distal SFA above this.  He had done quite well following this.  Noninvasive studies in September 2018 suggested that she may have restenosis of her bypass.  She underwent arteriography showing wide patency of her bypass.  She denies any claudication symptoms on the left.  She does not have any claudication on the right.  She reports that she does have severe osteoarthritis in her right knee.  She remains active.  Past Medical History:  Diagnosis Date   Allergic rhinitis    GERD (gastroesophageal reflux disease)    Heart murmur    History of pneumonia    Hyperlipidemia    PAD (peripheral artery disease) 2017   Status post left distal superficial femoral to tibio-peroneal trunk bypass - Dr Hart Rochester    Family History  Problem Relation Age of Onset   Other Mother        Brain tumor   CAD Father    Heart attack Father     SOCIAL HISTORY: Social History   Socioeconomic History   Marital status: Married    Spouse name: Not on file   Number of children: Not on file   Years of education: Not on file   Highest education level: Not on file  Occupational History   Not on file  Tobacco Use   Smoking status: Every Day    Packs/day: 0.50    Years: 45.00    Additional pack years: 0.00    Total pack years: 22.50    Types: Cigarettes    Passive exposure: Never   Smokeless tobacco: Never   Tobacco comments:    3 per day  Vaping Use   Vaping Use: Never used  Substance and Sexual Activity   Alcohol use: No     Alcohol/week: 0.0 standard drinks of alcohol   Drug use: No   Sexual activity: Not Currently    Birth control/protection: None  Other Topics Concern   Not on file  Social History Narrative   Not on file   Social Determinants of Health   Financial Resource Strain: Not on file  Food Insecurity: Not on file  Transportation Needs: Not on file  Physical Activity: Not on file  Stress: Not on file  Social Connections: Not on file  Intimate Partner Violence: Not on file    Allergies  Allergen Reactions   Dust Mite Extract    Grass Pollen(K-O-R-T-Swt Vern)    Mixed Grasses    Red Dye Hives and Itching    Current Outpatient Medications  Medication Sig Dispense Refill   acetaminophen (TYLENOL) 325 MG tablet Take 650 mg by mouth every 6 (six) hours as needed for mild pain or moderate pain.     aspirin EC 81 MG tablet Take 81 mg by mouth daily.     ezetimibe (ZETIA) 10 MG tablet Take 1 tablet (10 mg total) by mouth daily. 90 tablet 1   levocetirizine (XYZAL) 5 MG tablet Take 5 mg by mouth every evening.      metoprolol succinate (TOPROL-XL) 25 MG 24 hr tablet  Take 1 tablet by mouth once daily 90 tablet 1   montelukast (SINGULAIR) 10 MG tablet Take 10 mg by mouth at bedtime.     pantoprazole (PROTONIX) 40 MG tablet Take 40 mg by mouth daily before breakfast.      pseudoephedrine (SUDAFED) 60 MG tablet Take 60 mg by mouth every 4 (four) hours as needed for congestion.     rosuvastatin (CRESTOR) 40 MG tablet Take 1 tablet by mouth once daily 90 tablet 1   No current facility-administered medications for this visit.    REVIEW OF SYSTEMS:  [X]  denotes positive finding, [ ]  denotes negative finding Cardiac  Comments:  Chest pain or chest pressure:    Shortness of breath upon exertion:    Short of breath when lying flat:    Irregular heart rhythm:        Vascular    Pain in calf, thigh, or hip brought on by ambulation:    Pain in feet at night that wakes you up from your sleep:      Blood clot in your veins:    Leg swelling:         Pulmonary    Oxygen at home:    Productive cough:     Wheezing:         Neurologic    Sudden weakness in arms or legs:     Sudden numbness in arms or legs:     Sudden onset of difficulty speaking or slurred speech:    Temporary loss of vision in one eye:     Problems with dizziness:         Gastrointestinal    Blood in stool:     Vomited blood:         Genitourinary    Burning when urinating:     Blood in urine:        Psychiatric    Major depression:         Hematologic    Bleeding problems:    Problems with blood clotting too easily:        Skin    Rashes or ulcers:        Constitutional    Fever or chills:      PHYSICAL EXAM: Vitals:   04/22/23 1358  BP: (!) 158/88  Pulse: 78  Temp: (!) 97.3 F (36.3 C)  SpO2: 96%  Weight: 152 lb 6.4 oz (69.1 kg)  Height: 5\' 2"  (1.575 m)    GENERAL: The patient is a well-nourished female, in no acute distress. The vital signs are documented above. CARDIOVASCULAR: Left right femoral pulse.  I do not palpate right popliteal or distal pulses.  On the left she has 2+ left femoral pulse and easily palpable popliteal and dorsalis pedis pulse. PULMONARY: There is good air exchange  MUSCULOSKELETAL: There are no major deformities or cyanosis. NEUROLOGIC: No focal weakness or paresthesias are detected. SKIN: There are no ulcers or rashes noted. PSYCHIATRIC: The patient has a normal affect.  DATA:  Noninvasive studies today reveal ankle arm index of 0.53 on the right and 0.83 on the left.  Imaging of her bypass reveals patency with some diminished velocities  MEDICAL ISSUES: Stable 7 years status post popliteal to tibioperoneal trunk bypass.  I would encourage her to continue her walking program as she is doing.  I explained symptoms of graft occlusion with her.  She knows to seek attention immediately should this occur.  Otherwise we will see her on an as-needed  basis   Larina Earthly, MD [facility] V[facility] and Vein Specialists of [facility] [phone] Pager [phone]  Note: Portions of this report may have been transcribed using voice recognition software.  Every effort has been made to ensure accuracy; however, inadvertent computerized transcription errors may still be present.

## 2023-09-14 ENCOUNTER — Other Ambulatory Visit: Payer: Self-pay | Admitting: Cardiology

## 2023-09-16 ENCOUNTER — Other Ambulatory Visit: Payer: Self-pay | Admitting: Cardiology

## 2023-09-20 ENCOUNTER — Other Ambulatory Visit: Payer: Self-pay | Admitting: Cardiology

## 2023-11-03 ENCOUNTER — Ambulatory Visit: Payer: PPO | Attending: Cardiology

## 2023-11-03 ENCOUNTER — Encounter: Payer: Self-pay | Admitting: Cardiology

## 2023-11-03 ENCOUNTER — Telehealth: Payer: Self-pay | Admitting: Cardiology

## 2023-11-03 ENCOUNTER — Other Ambulatory Visit: Payer: Self-pay | Admitting: Cardiology

## 2023-11-03 ENCOUNTER — Ambulatory Visit: Payer: PPO | Attending: Cardiology | Admitting: Cardiology

## 2023-11-03 VITALS — BP 124/70 | HR 71 | Ht 62.0 in | Wt 152.6 lb

## 2023-11-03 DIAGNOSIS — I259 Chronic ischemic heart disease, unspecified: Secondary | ICD-10-CM | POA: Diagnosis not present

## 2023-11-03 DIAGNOSIS — E782 Mixed hyperlipidemia: Secondary | ICD-10-CM | POA: Diagnosis not present

## 2023-11-03 DIAGNOSIS — I739 Peripheral vascular disease, unspecified: Secondary | ICD-10-CM

## 2023-11-03 DIAGNOSIS — R002 Palpitations: Secondary | ICD-10-CM

## 2023-11-03 NOTE — Progress Notes (Signed)
    Cardiology Office Note  Date: 11/03/2023   ID: [name], DOB [birth year], MRN [id number]  History of Present Illness: [name] is a 72 y.o. female last seen in March.  She is here today for a follow-up visit.  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  She does report intermittent sense of chest "fluttering" has been most noticeable at rest over the last month.  Generally brief episodes, no associated syncope.  She did have follow-up with [facility] in April.  Right ABI 0.53 and TBI 0.39, left ABI 0.85 and TBI 0.61.  Duplex imaging indicated patent graft on the left.  No ncreasing claudication.  I reviewed her medications.  Current cardiac regimen includes aspirin, Crestor, Zetia, and Toprol-XL.  She reports compliance with therapy.  Zetia was added after her last lipid panel with LDL 92.  Physical Exam: VS:  BP 124/70   Pulse 71   Ht 5\' 2"  (1.575 m)   Wt 152 lb 9.6 oz (69.2 kg)   SpO2 96%   BMI 27.91 kg/m , BMI Body mass index is 27.91 kg/m.  Wt Readings from Last 3 Encounters:  11/03/23 152 lb 9.6 oz (69.2 kg)  04/22/23 152 lb 6.4 oz (69.1 kg)  03/19/23 152 lb (68.9 kg)    General: Patient appears comfortable at rest. HEENT: Conjunctiva and lids normal. Neck: Supple, no elevated JVP or carotid bruits. Lungs: Clear to auscultation, nonlabored breathing at rest. Cardiac: Regular rate and rhythm, no S3, 1/6 systolic murmur. Extremities: No pitting edema.  ECG:  An ECG dated 03/19/2023 was personally reviewed today and demonstrated:  Sinus rhythm.  Labwork: December 2023: Hemoglobin 14, platelets 333, BUN 12, creatinine 0.6, potassium 4.9, AST 19, ALT 13, TSH 1.41, cholesterol 157, triglycerides 100, HDL 43, LDL 94    Component Value Date/Time   CHOL 159 03/24/2023 0911   TRIG 85 03/24/2023 0911   HDL 50 03/24/2023 0911   CHOLHDL 3.2 03/24/2023 0911   VLDL 17 03/24/2023 0911   LDLCALC 92 03/24/2023 0911   Other Studies Reviewed Today:  No  interval cardiac testing for review today.  Assessment and Plan:  1.  Ischemic heart disease based on Lexiscan Myoview from April 2022 showing mid to basal inferoseptal ischemia.  LVEF 65 to 70% by echocardiography at that time.  Plan to continue medical therapy at this time, currently includes aspirin, Toprol-XL, Crestor, and Zetia.  2.  Intermittent palpitations as discussed above.  Plan to obtain a 7-day ZIO monitor for further investigation.   3.  Mixed hyperlipidemia, LDL 92 in March.  Zetia was added at that time.  Continue Crestor.   4.  PAD status post left distal superficial femoral to tibio-peroneal trunk bypass in 2017.  Follow-up vascular studies from April are noted above.  She saw [facility] at that time.   5.  Aortic valve sclerosis without stenosis, last assessed by echocardiogram in April 2022.  Disposition:  Follow up  6 months.  Signed, Jonelle Sidle, M.D., F.A.C.C. [facility]

## 2023-11-03 NOTE — Patient Instructions (Addendum)
Medication Instructions:  Your physician recommends that you continue on your current medications as directed. Please refer to the Current Medication list given to you today.  Labwork: none  Testing/Procedures: 7 Day ZIO XT  Follow-Up: Your physician recommends that you schedule a follow-up appointment in: 6 months  Any Other Special Instructions Will Be Listed Below (If Applicable).  If you need a refill on your cardiac medications before your next appointment, please call your pharmacy.

## 2023-11-03 NOTE — Telephone Encounter (Signed)
Checking percert on the following   7 Day ZIO XT dx: palpitations

## 2023-11-23 ENCOUNTER — Telehealth: Payer: Self-pay | Admitting: Cardiology

## 2023-11-23 NOTE — Telephone Encounter (Signed)
-----   Message from Nona Dell sent at 11/16/2023  4:39 PM EST ----- Results reviewed.  Cardiac monitor did not show any atrial fibrillation which was my main concern.  She has rare atrial and ventricular ectopy as well as a few brief episodes of SVT.  The "fluttering" sensation is likely related to the SVT.  If this is significantly bothersome, could try and increase Toprol-XL to 37.5 mg daily.  Otherwise would continue with observation.

## 2023-11-23 NOTE — Telephone Encounter (Signed)
Patient returned call for her monitor results.

## 2023-11-23 NOTE — Telephone Encounter (Signed)
Patient is returning RN's call. Please advise. 

## 2023-11-24 NOTE — Telephone Encounter (Signed)
Reports symptom isn't significantly bothersome at this time. Patient informed and verbalized understanding. Copy sent to PCP

## 2023-12-12 ENCOUNTER — Other Ambulatory Visit: Payer: Self-pay | Admitting: Cardiology

## 2023-12-19 ENCOUNTER — Other Ambulatory Visit: Payer: Self-pay | Admitting: Cardiology

## 2023-12-31 ENCOUNTER — Ambulatory Visit
Admission: EM | Admit: 2023-12-31 | Discharge: 2023-12-31 | Disposition: A | Discharge status: Home / Self Care | Payer: PPO | Attending: Family Medicine | Admitting: Family Medicine

## 2023-12-31 DIAGNOSIS — N390 Urinary tract infection, site not specified: Secondary | ICD-10-CM | POA: Diagnosis not present

## 2023-12-31 LAB — POCT URINALYSIS DIP (MANUAL ENTRY)
Glucose, UA: 250 mg/dL — AB
Nitrite, UA: POSITIVE — AB
Protein Ur, POC: >=300 mg/dL — AB
Spec Grav, UA: <=1.005 — AB (ref 1.010–1.025)
Urobilinogen, UA: >=8 U/dL — AB
pH, UA: 5 (ref 5.0–8.0)

## 2023-12-31 MED ORDER — SULFAMETHOXAZOLE-TRIMETHOPRIM 800-160 MG PO TABS
1.0000 | ORAL_TABLET | Freq: Two times a day (BID) | ORAL | 0 refills | Status: AC
Start: 2023-12-31 — End: 2024-01-03

## 2023-12-31 NOTE — ED Provider Notes (Signed)
 [facility]    CSN: [id number] Arrival date & time: 12/31/23  1002      History   Chief Complaint No chief complaint on file.   HPI [name] is a 73 y.o. female.   Patient presenting today with about a week of waxing and waning dysuria, urinary urgency and frequency.  Denies fever, chills, flank pain, nausea, vomiting, abdominal pain.  So far trying Azo with mild temporary benefit.  History of UTIs.    Past Medical History:  Diagnosis Date   Allergic rhinitis    GERD (gastroesophageal reflux disease)    Heart murmur    History of pneumonia    Hyperlipidemia    PAD (peripheral artery disease) (HCC) 2017   Status post left distal superficial femoral to tibio-peroneal trunk bypass - Dr Gerlean    Patient Active Problem List   Diagnosis Date Noted   PAD (peripheral artery disease) (HCC) 03/11/2016   Spinal stenosis of lumbar region 12/26/2015   Fever 06/05/2011    Past Surgical History:  Procedure Laterality Date   ABDOMINAL AORTOGRAM W/LOWER EXTREMITY N/A 09/23/2017   Procedure: ABDOMINAL AORTOGRAM W/LOWER EXTREMITY;  Surgeon: Sheree Penne Bruckner, MD;  Location: [facility] INVASIVE CV LAB;  Service: Cardiovascular;  Laterality: N/A;   ABDOMINAL HYSTERECTOMY     CHOLECYSTECTOMY     ENDARTERECTOMY FEMORAL Left 04/30/2016   Procedure: Endarterectomy of Left popliteal, anterior tibial and tibio-peroneal trunk;  Surgeon: Lynwood JONETTA Gerlean, MD;  Location: [facility] OR;  Service: Vascular;  Laterality: Left;   FEMORAL-POPLITEAL BYPASS GRAFT Left 04/30/2016   Procedure: Left Distal Superfical Femoral to Tibio-peroneal trunk bypass using non-reversed saphenous vein graph Left Leg;  Surgeon: Lynwood JONETTA Gerlean, MD;  Location: [facility] OR;  Service: Vascular;  Laterality: Left;   INTRAOPERATIVE ARTERIOGRAM Left 04/30/2016   Procedure: Left Leg INTRA OPERATIVE ARTERIOGRAM;  Surgeon: Lynwood JONETTA Gerlean, MD;  Location: [facility] OR;  Service: Vascular;  Laterality: Left;   LUMBAR LAMINECTOMY/DECOMPRESSION  MICRODISCECTOMY Left 12/26/2015   Procedure: LUMBAR LAMINECTOMY/DECOMPRESSION MICRODISCECTOMY 1 LEVEL L4-5 ON LEFT ;  Surgeon: Reyes Billing, MD;  Location: [facility] ORS;  Service: Orthopedics;  Laterality: Left;   OOPHORECTOMY Right    PERIPHERAL VASCULAR CATHETERIZATION N/A 03/25/2016   Procedure: Abdominal Aortogram w/Lower Extremity;  Surgeon: Gaile LELON New, MD;  Location: [facility] INVASIVE CV LAB;  Service: Cardiovascular;  Laterality: N/A;   PERIPHERAL VASCULAR CATHETERIZATION N/A 08/20/2016   Procedure: Abdominal Aortogram w/Lower Extremity;  Surgeon: Penne Bruckner Sheree, MD;  Location: [facility] INVASIVE CV LAB;  Service: Cardiovascular;  Laterality: N/A;   PERIPHERAL VASCULAR CATHETERIZATION Left 08/20/2016   Procedure: Peripheral Vascular Balloon Angioplasty;  Surgeon: Penne Bruckner Sheree, MD;  Location: [facility] INVASIVE CV LAB;  Service: Cardiovascular;  Laterality: Left;  Superficial femoral    OB History   No obstetric history on file.      Home Medications    Prior to Admission medications   Medication Sig Start Date End Date Taking? Authorizing Provider  sulfamethoxazole -trimethoprim  (BACTRIM  DS) 800-160 MG tablet Take 1 tablet by mouth 2 (two) times daily for 3 days. 12/31/23 01/03/24 Yes Stuart Vernell Norris, PA-C  acetaminophen  (TYLENOL ) 325 MG tablet Take 650 mg by mouth every 6 (six) hours as needed for mild pain or moderate pain.    [provider]  aspirin  EC 81 MG tablet Take 81 mg by mouth daily.    [provider]  ezetimibe  (ZETIA ) 10 MG tablet Take 1 tablet (10 mg total) by mouth daily. 03/26/23 11/02/24  Debera,  Jayson MATSU, MD  levocetirizine (XYZAL ) 5 MG tablet Take 5 mg by mouth every evening.     [provider]  metoprolol  succinate (TOPROL -XL) 25 MG 24 hr tablet Take 1 tablet by mouth once daily 12/14/23   Debera Jayson MATSU, MD  montelukast  (SINGULAIR ) 10 MG tablet Take 10 mg by mouth at bedtime.    [provider]  pantoprazole   (PROTONIX ) 40 MG tablet Take 40 mg by mouth daily before breakfast.     [provider]  pseudoephedrine (SUDAFED) 60 MG tablet Take 60 mg by mouth every 4 (four) hours as needed for congestion.    [provider]  rosuvastatin  (CRESTOR ) 40 MG tablet Take 1 tablet by mouth once daily 12/21/23   Debera Jayson MATSU, MD    Family History Family History  Problem Relation Age of Onset   Other Mother        Brain tumor   CAD Father    Heart attack Father     Social History Social History   Tobacco Use   Smoking status: Every Day    Current packs/day: 0.50    Average packs/day: 0.5 packs/day for 45.0 years (22.5 ttl pk-yrs)    Types: Cigarettes    Passive exposure: Never   Smokeless tobacco: Never   Tobacco comments:    3 per day  Vaping Use   Vaping status: Never Used  Substance Use Topics   Alcohol use: No    Alcohol/week: 0.0 standard drinks of alcohol   Drug use: No     Allergies   Dust mite extract, Grass pollen([id number]-swt vern), Mixed grasses, and Red dye #40 (allura red)   Review of Systems Review of Systems PER HPI  Physical Exam Triage Vital Signs ED Triage Vitals  Encounter Vitals Group     BP 12/31/23 1014 119/70     Systolic BP Percentile --      Diastolic BP Percentile --      Pulse Rate 12/31/23 1014 79     Resp 12/31/23 1014 16     Temp 12/31/23 1014 98.7 F (37.1 C)     Temp Source 12/31/23 1014 Oral     SpO2 12/31/23 1014 94 %     Weight --      Height --      Head Circumference --      Peak Flow --      Pain Score 12/31/23 1017 6     Pain Loc --      Pain Education --      Exclude from Growth Chart --    No data found.  Updated Vital Signs BP 119/70 (BP Location: Right Arm)   Pulse 79   Temp 98.7 F (37.1 C) (Oral)   Resp 16   SpO2 94%   Visual Acuity Right Eye Distance:   Left Eye Distance:   Bilateral Distance:    Right Eye Near:   Left Eye Near:    Bilateral Near:     Physical Exam Vitals and nursing  note reviewed.  Constitutional:      Appearance: Normal appearance. She is not ill-appearing.  HENT:     Head: Atraumatic.  Eyes:     Extraocular Movements: Extraocular movements intact.     Conjunctiva/sclera: Conjunctivae normal.  Cardiovascular:     Rate and Rhythm: Normal rate and regular rhythm.     Heart sounds: Normal heart sounds.  Pulmonary:     Effort: Pulmonary effort is normal.  Breath sounds: Normal breath sounds.  Abdominal:     General: Bowel sounds are normal. There is no distension.     Palpations: Abdomen is soft.     Tenderness: There is no abdominal tenderness. There is no right CVA tenderness, left CVA tenderness or guarding.  Musculoskeletal:        General: Normal range of motion.     Cervical back: Normal range of motion and neck supple.  Skin:    General: Skin is warm and dry.  Neurological:     Mental Status: She is alert and oriented to person, place, and time.  Psychiatric:        Mood and Affect: Mood normal.        Thought Content: Thought content normal.        Judgment: Judgment normal.      UC Treatments / Results  Labs (all labs ordered are listed, but only abnormal results are displayed) Labs Reviewed  POCT URINALYSIS DIP (MANUAL ENTRY) - Abnormal; Notable for the following components:      Result Value   Color, UA orange (*)    Glucose, UA =250 (*)    Bilirubin, UA moderate (*)    Ketones, POC UA small (15) (*)    Spec Grav, UA <=1.005 (*)    Blood, UA trace-intact (*)    Protein Ur, POC >=300 (*)    Urobilinogen, UA >=8.0 (*)    Nitrite, UA Positive (*)    Leukocytes, UA Large (3+) (*)    All other components within normal limits  URINE CULTURE    EKG   Radiology No results found.  Procedures Procedures (including critical care time)  Medications Ordered in UC Medications - No data to display  Initial Impression / Assessment and Plan / UC Course  I have reviewed the triage vital signs and the nursing  notes.  Pertinent labs & imaging results that were available during my care of the patient were reviewed by me and considered in my medical decision making (see chart for details).     U/A with evidence of UTI. Treat with bactrim , AZO prn, fluids. Culture pending, adjust treatment if warranted. Return for worsening sxs.  Final Clinical Impressions(s) / UC Diagnoses   Final diagnoses:  Acute lower UTI   Discharge Instructions   None    ED Prescriptions     Medication Sig Dispense Auth. Provider   sulfamethoxazole -trimethoprim  (BACTRIM  DS) 800-160 MG tablet Take 1 tablet by mouth 2 (two) times daily for 3 days. 6 tablet Stuart Vernell Norris, [place]      PDMP not reviewed this encounter.   Stuart Vernell Norris, [place] 12/31/23 1122

## 2023-12-31 NOTE — ED Triage Notes (Signed)
 Pt reports UTI sx's, shaking with urination, pressure with urination, burning with urination, urinary frequency and urgency. X 1 week has been using AZO to relieve sx's states it has helped but when it wears off sx's return.

## 2024-01-01 LAB — URINE CULTURE: Culture: NO GROWTH

## 2024-02-01 DIAGNOSIS — H5212 Myopia, left eye: Secondary | ICD-10-CM | POA: Diagnosis not present

## 2024-02-01 DIAGNOSIS — H35373 Puckering of macula, bilateral: Secondary | ICD-10-CM | POA: Diagnosis not present

## 2024-03-17 ENCOUNTER — Other Ambulatory Visit: Payer: Self-pay | Admitting: Cardiology

## 2024-04-04 DIAGNOSIS — M542 Cervicalgia: Secondary | ICD-10-CM | POA: Diagnosis not present

## 2024-04-28 DIAGNOSIS — M542 Cervicalgia: Secondary | ICD-10-CM | POA: Diagnosis not present

## 2024-05-05 DIAGNOSIS — M4800 Spinal stenosis, site unspecified: Secondary | ICD-10-CM | POA: Diagnosis not present

## 2024-05-05 DIAGNOSIS — M5412 Radiculopathy, cervical region: Secondary | ICD-10-CM | POA: Diagnosis not present

## 2024-05-05 DIAGNOSIS — M4802 Spinal stenosis, cervical region: Secondary | ICD-10-CM | POA: Diagnosis not present

## 2024-05-17 DIAGNOSIS — M5412 Radiculopathy, cervical region: Secondary | ICD-10-CM | POA: Diagnosis not present

## 2024-05-23 ENCOUNTER — Ambulatory Visit
Admission: EM | Admit: 2024-05-23 | Discharge: 2024-05-23 | Disposition: A | Discharge status: Home / Self Care | Attending: Family Medicine | Admitting: Family Medicine

## 2024-05-23 DIAGNOSIS — R062 Wheezing: Secondary | ICD-10-CM

## 2024-05-23 DIAGNOSIS — J0100 Acute maxillary sinusitis, unspecified: Secondary | ICD-10-CM | POA: Diagnosis not present

## 2024-05-23 MED ORDER — AMOXICILLIN-POT CLAVULANATE 875-125 MG PO TABS: 1.0000 | ORAL_TABLET | Freq: Two times a day (BID) | ORAL | 0 refills | Status: DC

## 2024-05-23 MED ORDER — AZELASTINE HCL 0.1 % NA SOLN: 1.0000 | Freq: Two times a day (BID) | NASAL | 0 refills | Status: DC

## 2024-05-23 MED ORDER — ALBUTEROL SULFATE HFA 108 (90 BASE) MCG/ACT IN AERS: 2.0000 | INHALATION_SPRAY | RESPIRATORY_TRACT | 0 refills | Status: DC | PRN

## 2024-05-23 NOTE — Discharge Instructions (Signed)
 I have sent over course of antibiotics, a new nasal spray to start daily in addition to your allergy medication, and albuterol  to help with wheezing and chest tightness.  Continue the over-the-counter supportive medications as well.  Return for worsening symptoms.

## 2024-05-23 NOTE — ED Provider Notes (Signed)
 [facility]    CSN: [id number] Arrival date & time: 05/23/24  0804      History   Chief Complaint Chief Complaint  Patient presents with   Cough   Facial Pain    HPI [name] is a 73 y.o. female.   Patient presenting today with 1.5-week history of nasal congestion, facial pain and pressure, headache, cough, fatigue, wheezing.  Denies chest pain, shortness of breath, abdominal pain, vomiting, diarrhea.  So far trying Mucinex, Sudafed, Tylenol , Flonase, Delsym with minimal relief.  No known history of chronic pulmonary disease per patient.    Past Medical History:  Diagnosis Date   Allergic rhinitis    GERD (gastroesophageal reflux disease)    Heart murmur    History of pneumonia    Hyperlipidemia    PAD (peripheral artery disease) (HCC) 2017   Status post left distal superficial femoral to tibio-peroneal trunk bypass - Dr Timm Foot    Patient Active Problem List   Diagnosis Date Noted   PAD (peripheral artery disease) (HCC) 03/11/2016   Spinal stenosis of lumbar region 12/26/2015   Fever 06/05/2011    Past Surgical History:  Procedure Laterality Date   ABDOMINAL AORTOGRAM W/LOWER EXTREMITY N/A 09/23/2017   Procedure: ABDOMINAL AORTOGRAM W/LOWER EXTREMITY;  Surgeon: Adine Hoof, MD;  Location: [facility] INVASIVE CV LAB;  Service: Cardiovascular;  Laterality: N/A;   ABDOMINAL HYSTERECTOMY     CHOLECYSTECTOMY     ENDARTERECTOMY FEMORAL Left 04/30/2016   Procedure: Endarterectomy of Left popliteal, anterior tibial and tibio-peroneal trunk;  Surgeon: Palma Bob, MD;  Location: [facility] OR;  Service: Vascular;  Laterality: Left;   FEMORAL-POPLITEAL BYPASS GRAFT Left 04/30/2016   Procedure: Left Distal Superfical Femoral to Tibio-peroneal trunk bypass using non-reversed saphenous vein graph Left Leg;  Surgeon: Palma Bob, MD;  Location: [facility] OR;  Service: Vascular;  Laterality: Left;   INTRAOPERATIVE ARTERIOGRAM Left 04/30/2016   Procedure: Left Leg INTRA  OPERATIVE ARTERIOGRAM;  Surgeon: Palma Bob, MD;  Location: [facility] OR;  Service: Vascular;  Laterality: Left;   LUMBAR LAMINECTOMY/DECOMPRESSION MICRODISCECTOMY Left 12/26/2015   Procedure: LUMBAR LAMINECTOMY/DECOMPRESSION MICRODISCECTOMY 1 LEVEL L4-5 ON LEFT ;  Surgeon: Orvan Blanch, MD;  Location: [facility] ORS;  Service: Orthopedics;  Laterality: Left;   OOPHORECTOMY Right    PERIPHERAL VASCULAR CATHETERIZATION N/A 03/25/2016   Procedure: Abdominal Aortogram w/Lower Extremity;  Surgeon: Margherita Shell, MD;  Location: [facility] INVASIVE CV LAB;  Service: Cardiovascular;  Laterality: N/A;   PERIPHERAL VASCULAR CATHETERIZATION N/A 08/20/2016   Procedure: Abdominal Aortogram w/Lower Extremity;  Surgeon: Adine Hoof, MD;  Location: [facility] INVASIVE CV LAB;  Service: Cardiovascular;  Laterality: N/A;   PERIPHERAL VASCULAR CATHETERIZATION Left 08/20/2016   Procedure: Peripheral Vascular Balloon Angioplasty;  Surgeon: Adine Hoof, MD;  Location: [facility] INVASIVE CV LAB;  Service: Cardiovascular;  Laterality: Left;  Superficial femoral    OB History   No obstetric history on file.      Home Medications    Prior to Admission medications   Medication Sig Start Date End Date Taking? Authorizing Provider  acetaminophen  (TYLENOL ) 325 MG tablet Take 650 mg by mouth every 6 (six) hours as needed for mild pain or moderate pain.   Yes [provider]  albuterol  (VENTOLIN  HFA) 108 (90 Base) MCG/ACT inhaler Inhale 2 puffs into the lungs every 4 (four) hours as needed. 05/23/24  Yes Corbin Dess, PA-C  amoxicillin -clavulanate (AUGMENTIN ) 875-125 MG tablet Take 1 tablet by mouth every 12 (twelve) hours.  05/23/24  Yes Corbin Dess, PA-C  aspirin  EC 81 MG tablet Take 81 mg by mouth daily.   Yes [provider]  azelastine (ASTELIN) 0.1 % nasal spray Place 1 spray into both nostrils 2 (two) times daily. Use in each nostril as directed 05/23/24  Yes Corbin Dess, PA-C   ezetimibe  (ZETIA ) 10 MG tablet Take 1 tablet (10 mg total) by mouth daily. 03/26/23 11/02/24 Yes Gerard Knight, MD  levocetirizine (XYZAL ) 5 MG tablet Take 5 mg by mouth every evening.    Yes [provider]  metoprolol  succinate (TOPROL -XL) 25 MG 24 hr tablet Take 1 tablet by mouth once daily 12/14/23  Yes Gerard Knight, MD  montelukast  (SINGULAIR ) 10 MG tablet Take 10 mg by mouth at bedtime.   Yes [provider]  pantoprazole  (PROTONIX ) 40 MG tablet Take 40 mg by mouth daily before breakfast.    Yes [provider]  pseudoephedrine (SUDAFED) 60 MG tablet Take 60 mg by mouth every 4 (four) hours as needed for congestion.   Yes [provider]  rosuvastatin  (CRESTOR ) 40 MG tablet Take 1 tablet by mouth once daily 03/17/24  Yes Gerard Knight, MD    Family History Family History  Problem Relation Age of Onset   Other Mother        Brain tumor   CAD Father    Heart attack Father     Social History Social History   Tobacco Use   Smoking status: Every Day    Current packs/day: 0.50    Average packs/day: 0.5 packs/day for 45.0 years (22.5 ttl pk-yrs)    Types: Cigarettes    Passive exposure: Never   Smokeless tobacco: Never   Tobacco comments:    3 per day  Vaping Use   Vaping status: Never Used  Substance Use Topics   Alcohol use: No    Alcohol/week: 0.0 standard drinks of alcohol   Drug use: No     Allergies   Dust mite extract, Grass pollen(k-o-r-t-swt vern), Mixed grasses, and Red dye #40 (allura red)   Review of Systems Review of Systems Per HPI  Physical Exam Triage Vital Signs ED Triage Vitals  Encounter Vitals Group     BP 05/23/24 0818 135/73     Systolic BP Percentile --      Diastolic BP Percentile --      Pulse Rate 05/23/24 0818 94     Resp 05/23/24 0818 18     Temp 05/23/24 0818 98 F (36.7 C)     Temp Source 05/23/24 0818 Oral     SpO2 05/23/24 0818 92 %     Weight --      Height --      Head  Circumference --      Peak Flow --      Pain Score 05/23/24 0819 8     Pain Loc --      Pain Education --      Exclude from Growth Chart --    No data found.  Updated Vital Signs BP 135/73 (BP Location: Right Arm)   Pulse 94   Temp 98 F (36.7 C) (Oral)   Resp 18   SpO2 92%   Visual Acuity Right Eye Distance:   Left Eye Distance:   Bilateral Distance:    Right Eye Near:   Left Eye Near:    Bilateral Near:     Physical Exam Vitals and nursing note reviewed.  Constitutional:  Appearance: Normal appearance.  HENT:     Head: Atraumatic.     Right Ear: Tympanic membrane and external ear normal.     Left Ear: Tympanic membrane and external ear normal.     Nose: Congestion present.     Mouth/Throat:     Mouth: Mucous membranes are moist.     Pharynx: Posterior oropharyngeal erythema present.  Eyes:     Extraocular Movements: Extraocular movements intact.     Conjunctiva/sclera: Conjunctivae normal.  Cardiovascular:     Rate and Rhythm: Normal rate and regular rhythm.     Heart sounds: Normal heart sounds.  Pulmonary:     Effort: Pulmonary effort is normal.     Breath sounds: Wheezing present. No rales.  Musculoskeletal:        General: Normal range of motion.     Cervical back: Normal range of motion and neck supple.  Skin:    General: Skin is warm and dry.  Neurological:     Mental Status: She is alert and oriented to person, place, and time.  Psychiatric:        Mood and Affect: Mood normal.        Thought Content: Thought content normal.      UC Treatments / Results  Labs (all labs ordered are listed, but only abnormal results are displayed) Labs Reviewed - No data to display  EKG   Radiology No results found.  Procedures Procedures (including critical care time)  Medications Ordered in UC Medications - No data to display  Initial Impression / Assessment and Plan / UC Course  I have reviewed the triage vital signs and the nursing  notes.  Pertinent labs & imaging results that were available during my care of the patient were reviewed by me and considered in my medical decision making (see chart for details).     Vital signs within normal limits today, given duration and worsening course of symptoms will treat for sinus infection with Augmentin , Astelin, supportive over-the-counter medications and home care and provide albuterol  inhaler for wheezes.  Discussed supportive remedies and return precautions.  Final Clinical Impressions(s) / UC Diagnoses   Final diagnoses:  Acute non-recurrent maxillary sinusitis  Wheezing     Discharge Instructions      I have sent over course of antibiotics, a new nasal spray to start daily in addition to your allergy medication, and albuterol  to help with wheezing and chest tightness.  Continue the over-the-counter supportive medications as well.  Return for worsening symptoms.  ED Prescriptions     Medication Sig Dispense Auth. Provider   amoxicillin -clavulanate (AUGMENTIN ) 875-125 MG tablet Take 1 tablet by mouth every 12 (twelve) hours. 14 tablet Corbin Dess, PA-C   azelastine (ASTELIN) 0.1 % nasal spray Place 1 spray into both nostrils 2 (two) times daily. Use in each nostril as directed 30 mL Corbin Dess, PA-C   albuterol  (VENTOLIN  HFA) 108 (90 Base) MCG/ACT inhaler Inhale 2 puffs into the lungs every 4 (four) hours as needed. 18 g Corbin Dess, [place]      PDMP not reviewed this encounter.   Corbin Dess, [place] 05/23/24 1410

## 2024-05-23 NOTE — ED Triage Notes (Signed)
 Sinus pain and pressure, headache, cough, congestion x 1.5 weeks. Taking mucinex, sudafed, tylenol , Flonase and delsym with no relief of symptoms.

## 2024-05-26 DIAGNOSIS — J40 Bronchitis, not specified as acute or chronic: Secondary | ICD-10-CM | POA: Diagnosis not present

## 2024-05-26 DIAGNOSIS — I739 Peripheral vascular disease, unspecified: Secondary | ICD-10-CM | POA: Diagnosis not present

## 2024-05-26 DIAGNOSIS — J441 Chronic obstructive pulmonary disease with (acute) exacerbation: Secondary | ICD-10-CM | POA: Diagnosis not present

## 2024-05-26 DIAGNOSIS — J449 Chronic obstructive pulmonary disease, unspecified: Secondary | ICD-10-CM | POA: Diagnosis not present

## 2024-05-26 DIAGNOSIS — Z6825 Body mass index (BMI) 25.0-25.9, adult: Secondary | ICD-10-CM | POA: Diagnosis not present

## 2024-05-26 DIAGNOSIS — J0100 Acute maxillary sinusitis, unspecified: Secondary | ICD-10-CM | POA: Diagnosis not present

## 2024-06-23 DIAGNOSIS — H11442 Conjunctival cysts, left eye: Secondary | ICD-10-CM | POA: Diagnosis not present

## 2024-07-15 ENCOUNTER — Ambulatory Visit: Attending: Student | Admitting: Student

## 2024-07-15 ENCOUNTER — Encounter: Payer: Self-pay | Admitting: Student

## 2024-07-15 VITALS — BP 136/68 | HR 85 | Ht 61.0 in | Wt 146.0 lb

## 2024-07-15 DIAGNOSIS — I739 Peripheral vascular disease, unspecified: Secondary | ICD-10-CM

## 2024-07-15 DIAGNOSIS — I259 Chronic ischemic heart disease, unspecified: Secondary | ICD-10-CM | POA: Diagnosis not present

## 2024-07-15 DIAGNOSIS — Z79899 Other long term (current) drug therapy: Secondary | ICD-10-CM

## 2024-07-15 DIAGNOSIS — E785 Hyperlipidemia, unspecified: Secondary | ICD-10-CM | POA: Diagnosis not present

## 2024-07-15 DIAGNOSIS — R002 Palpitations: Secondary | ICD-10-CM

## 2024-07-15 DIAGNOSIS — I700 Atherosclerosis of aorta: Secondary | ICD-10-CM | POA: Diagnosis not present

## 2024-07-15 NOTE — Patient Instructions (Signed)
 Medication Instructions:  Your physician recommends that you continue on your current medications as directed. Please refer to the Current Medication list given to you today.  *If you need a refill on your cardiac medications before your next appointment, please call your pharmacy*  Lab Work: Your physician recommends that you return for lab work in: Fasting ( Lipid, CMP, CBC)   If you have labs (blood work) drawn today and your tests are completely normal, you will receive your results only by: MyChart Message (if you have MyChart) OR A paper copy in the mail If you have any lab test that is abnormal or we need to change your treatment, we will call you to review the results.  Testing/Procedures: NONE   Follow-Up: At [facility], you and your health needs are our priority.  As part of our continuing mission to provide you with exceptional heart care, our providers are all part of one team.  This team includes your primary Cardiologist (physician) and Advanced Practice Providers or APPs (Physician Assistants and Nurse Practitioners) who all work together to provide you with the care you need, when you need it.  Your next appointment:   6 month(s)  Provider:   Jayson Sierras, MD or Laymon Qua, PA-C    We recommend signing up for the patient portal called MyChart.  Sign up information is provided on this After Visit Summary.  MyChart is used to connect with patients for Virtual Visits (Telemedicine).  Patients are able to view lab/test results, encounter notes, upcoming appointments, etc.  Non-urgent messages can be sent to your provider as well.   To learn more about what you can do with MyChart, go to [email or web address].   Other Instructions Thank you for choosing [facility]!

## 2024-07-15 NOTE — Progress Notes (Signed)
 Cardiology Office Note    Date:  07/15/2024  ID:  [name], DOB [birth year], MRN [id number] Cardiologist: Jayson Sierras, MD Cardiology APP:  Johnson Laymon HERO, PA-C { := History of Present Illness:    [name] is a 73 y.o. female with past medical history of presumed CAD (NST in 03/2021 showing mid to basal inferior septal ischemia and medical management pursued), palpitations (monitor in 10/2023 showing PAC's, PVC's and brief SVT), HLD and PAD (prior left distal superficial femoral to tibioperoneal trunk bypass in 2017) who presents to the office today for 34-month follow-up.  She was last examined by Dr. Sierras in 10/2023 and was still working and reported occasional palpitations but no chest pain or worsening shortness of breath. A 7-day Zio patch was recommended for further assessment of her palpitations and she was continued on ASA 81 mg daily, Zetia  10 mg daily, Toprol -XL 25 mg daily and Crestor  40 mg daily. Her monitor showed predominantly normal sinus rhythm with an average heart rate of 78 bpm. She did have rare PAC's and PVC's representing less than 1% of total beats and occasional episodes of SVT with the longest lasting 16 beats. It was recommended that if she had worsening symptoms, could increase Toprol -XL to 37.5 mg daily.  In talk with the patient today, she reports her biggest issue over the past several months has been worsening back pain and she is meeting with neurosurgery later this month. 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 Also remains active at home and was recently doing yard work yesterday. She denies any recent chest pain or dyspnea on exertion with routine activities. Does not use her Albuterol  routinely. No recent orthopnea, PND or pitting edema. She does experience occasional palpitations but reports these typically occur once every 2 to 3 months and spontaneously resolve  within a few seconds. She is unaware of any specific triggers. Only consumes 1 cup of coffee a day and does not consume alcohol.  Studies Reviewed:   EKG: EKG is ordered today and demonstrates:   EKG Interpretation Date/Time:  Friday July 15 2024 13:56:23 EDT Ventricular Rate:  85 PR Interval:  152 QRS Duration:  80 QT Interval:  406 QTC Calculation: 483 R Axis:   52  Text Interpretation: Normal sinus rhythm Normal ECG When compared with ECG of 23-Sep-2017 07:35, Confirmed by Johnson Laymon ([id number]) on 07/15/2024 2:11:29 PM       Echocardiogram: 03/2021 IMPRESSIONS     1. Left ventricular ejection fraction, by estimation, is 65 to 70%. The  left ventricle has normal function. The left ventricle has no regional  wall motion abnormalities. Left ventricular diastolic parameters are  consistent with Grade I diastolic  dysfunction (impaired relaxation).   2. Right ventricular systolic function is normal. The right ventricular  size is normal. Tricuspid regurgitation signal is inadequate for assessing  PA pressure.   3. The mitral valve is abnormal, mildly thickened and moderately  calcified. Trivial mitral valve regurgitation. Moderate mitral annular  calcification.   4. The aortic valve is tricuspid. There is moderate calcification of the  aortic valve. Aortic valve regurgitation is not visualized. Mild to  moderate aortic valve sclerosis/calcification is present, without any  evidence of aortic stenosis.   5. The inferior vena cava is normal in size with greater than 50%  respiratory variability, suggesting right atrial pressure of 3 mmHg.    NST: 03/2021  No diagnostic ST segment changes indicate ischemia. Medium sized, moderate intensity, mid to basal inferoseptal defect that is partially reversible and consistent with ischemia. This is an intermediate risk study. Nuclear stress EF: 70%.  Event Monitor: 10/2023 ZIO monitor reviewed.  6 days, 21 hours analyzed.    Predominant rhythm is sinus with heart rate ranging from 58 bpm up to 112 bpm and average heart rate 78 bpm. There were rare PACs including atrial couplets and triplets representing less than 1% total beats. There were rare PVCs including ventricular couplets representing less than 1% total beats.  Also limited ventricular trigeminy. Seven episodes of brief PSVT were noted, the longest of which was 16 beats. No pauses or high degree heart block noted.   Physical Exam:   VS:  BP 136/68 (BP Location: Left Arm, Cuff Size: Normal)   Pulse 85   Ht 5' 1 (1.549 m)   Wt 146 lb (66.2 kg)   SpO2 96%   BMI 27.59 kg/m    Wt Readings from Last 3 Encounters:  07/15/24 146 lb (66.2 kg)  11/03/23 152 lb 9.6 oz (69.2 kg)  04/22/23 152 lb 6.4 oz (69.1 kg)     GEN: Pleasant female appearing in no acute distress NECK: No JVD; No carotid bruits CARDIAC: RRR, 2/6 SEM along RUSB.  RESPIRATORY:  Clear to auscultation without rales, wheezing or rhonchi  ABDOMEN: Appears non-distended. No obvious abdominal masses. EXTREMITIES: No clubbing or cyanosis. No pitting edema.  Distal pedal pulses are 2+ bilaterally.   Assessment and Plan:   1. Ischemic heart disease - NST in 03/2021 showed mid to basal inferior septal ischemia and medical management was pursued given no recurrent symptoms. She remains active at baseline denies any recent anginal symptoms. - While formal cardiac clearance has not been requested for surgery, she is able to perform more than 4 METS of activity without angina. RCRI Risk is at 1.1% risk of a major cardiac event. Would not require further cardiac testing prior to this if performed within the next few months. I encouraged her to reach out if she develops any concerning symptoms as a Coronary CTA could be arranged for further assessment. - Continue ASA 81 mg daily, Toprol -XL 25 mg daily and Crestor  40 mg daily.  2. Palpitations - Recent monitor showed rare PAC's and PVC's  representing less than 1% of total beats and occasional episodes of SVT with the longest lasting 16 beats. She reports occasional, brief palpitations but no persistent symptoms. Prefers to remain on her current medical therapy with Toprol -XL 25 mg daily. Will recheck a CBC and BMET with upcoming labs.   3. PAD (peripheral artery disease) (HCC) - She previously underwent left distal superficial femoral to tibioperoneal trunk bypass in 2017. Followed by Vascular Surgery.   4. Hyperlipidemia LDL goal <70 - LDL was at 92 when checked in 02/2023. She remains on Crestor  40 mg daily. Previously intolerant to Zetia  due to nausea. Will recheck an FLP and LFT's. We reviewed alternative options including PCSK9 inhibitors or Leqvio. She does not think she would be able to do self injections with PCSK9 inhibitors. If LDL remains above goal, would consider Leqvio if covered by her insurance.  5. Systolic Murmur - She reports having a murmur for many years and echocardiogram in 03/2021 showed aortic sclerosis/calcification without stenosis or regurgitation. No indication for repeat testing at this time.   6. Tobacco Use - She does continue to smoke 0.5 ppd. Cessation advised.   Signed, Laymon HERO  Avin Upperman, PA-C

## 2024-07-22 ENCOUNTER — Ambulatory Visit: Admitting: Nurse Practitioner

## 2024-07-27 DIAGNOSIS — M4802 Spinal stenosis, cervical region: Secondary | ICD-10-CM | POA: Diagnosis not present

## 2024-07-27 DIAGNOSIS — M25519 Pain in unspecified shoulder: Secondary | ICD-10-CM | POA: Insufficient documentation

## 2024-07-27 DIAGNOSIS — M542 Cervicalgia: Secondary | ICD-10-CM | POA: Diagnosis not present

## 2024-07-27 DIAGNOSIS — M75102 Unspecified rotator cuff tear or rupture of left shoulder, not specified as traumatic: Secondary | ICD-10-CM | POA: Diagnosis not present

## 2024-07-27 DIAGNOSIS — M75100 Unspecified rotator cuff tear or rupture of unspecified shoulder, not specified as traumatic: Secondary | ICD-10-CM | POA: Insufficient documentation

## 2024-08-01 DIAGNOSIS — J0100 Acute maxillary sinusitis, unspecified: Secondary | ICD-10-CM | POA: Diagnosis not present

## 2024-08-01 DIAGNOSIS — E663 Overweight: Secondary | ICD-10-CM | POA: Diagnosis not present

## 2024-08-01 DIAGNOSIS — J449 Chronic obstructive pulmonary disease, unspecified: Secondary | ICD-10-CM | POA: Diagnosis not present

## 2024-08-01 DIAGNOSIS — Z6826 Body mass index (BMI) 26.0-26.9, adult: Secondary | ICD-10-CM | POA: Diagnosis not present

## 2024-09-02 ENCOUNTER — Encounter: Payer: Self-pay | Admitting: Physician Assistant

## 2024-09-02 ENCOUNTER — Ambulatory Visit: Admitting: Physician Assistant

## 2024-09-02 VITALS — BP 108/67 | HR 75 | Ht 61.0 in | Wt 142.0 lb

## 2024-09-02 DIAGNOSIS — Z23 Encounter for immunization: Secondary | ICD-10-CM

## 2024-09-02 DIAGNOSIS — J301 Allergic rhinitis due to pollen: Secondary | ICD-10-CM

## 2024-09-02 DIAGNOSIS — I259 Chronic ischemic heart disease, unspecified: Secondary | ICD-10-CM

## 2024-09-02 DIAGNOSIS — E782 Mixed hyperlipidemia: Secondary | ICD-10-CM

## 2024-09-02 DIAGNOSIS — E785 Hyperlipidemia, unspecified: Secondary | ICD-10-CM | POA: Insufficient documentation

## 2024-09-02 DIAGNOSIS — Z7689 Persons encountering health services in other specified circumstances: Secondary | ICD-10-CM

## 2024-09-02 NOTE — Assessment & Plan Note (Signed)
 Allergic rhinitis managed with Xyzal , nasal spray, and Singulair . Frequent sinus infections related to allergies. - Continue Xyzal , nasal spray, and Singulair . - Monitor for sinus infections and follow up as needed.

## 2024-09-02 NOTE — Patient Instructions (Signed)
Influenza (Flu) Vaccine (Inactivated or Recombinant): What You Need to Know Many vaccine information statements are available in Spanish and other languages. See PromoAge.com.br. 1. Why get vaccinated? Influenza vaccine can prevent influenza (flu). Flu is a contagious disease that spreads around the [place] every year, usually between October and May. Anyone can get the flu, but it is more dangerous for some people. Infants and young children, people 58 years and older, pregnant people, and people with certain health conditions or a weakened immune system are at greatest risk of flu complications. Pneumonia, bronchitis, sinus infections, and ear infections are examples of flu-related complications. If you have a medical condition, such as heart disease, cancer, or diabetes, flu can make it worse. Flu can cause fever and chills, sore throat, muscle aches, fatigue, cough, headache, and runny or stuffy nose. Some people may have vomiting and diarrhea, though this is more common in children than adults. In an average year, thousands of people in the [place] die from flu, and many more are hospitalized. Flu vaccine prevents millions of illnesses and flu-related visits to the doctor each year. 2. Influenza vaccines CDC recommends everyone 6 months and older get vaccinated every flu season. Children 6 months through 51 years of age may need 2 doses during a single flu season. Everyone else needs only 1 dose each flu season. It takes about 2 weeks for protection to develop after vaccination. There are many flu viruses, and they are always changing. Each year a new flu vaccine is made to protect against the influenza viruses believed to be likely to cause disease in the upcoming flu season. Even when the vaccine doesn't exactly match these viruses, it may still provide some protection. Influenza vaccine does not cause flu. Influenza vaccine may be given at the same time as other vaccines. 3.  Talk with your health care provider Tell your vaccination provider if the person getting the vaccine: Has had an allergic reaction after a previous dose of influenza vaccine, or has any severe, life-threatening allergies Has ever had Guillain-Barr Syndrome (also called "GBS") In some cases, your health care provider may decide to postpone influenza vaccination until a future visit. Influenza vaccine can be administered at any time during pregnancy. People who are or will be pregnant during influenza season should receive inactivated influenza vaccine. People with minor illnesses, such as a cold, may be vaccinated. People who are moderately or severely ill should usually wait until they recover before getting influenza vaccine. Your health care provider can give you more information. 4. Risks of a vaccine reaction Soreness, redness, and swelling where the shot is given, fever, muscle aches, and headache can happen after influenza vaccination. There may be a very small increased risk of Guillain-Barr Syndrome (GBS) after inactivated influenza vaccine (the flu shot). Young children who get the flu shot along with pneumococcal vaccine (PCV13) and/or DTaP vaccine at the same time might be slightly more likely to have a seizure caused by fever. Tell your health care provider if a child who is getting flu vaccine has ever had a seizure. People sometimes faint after medical procedures, including vaccination. Tell your provider if you feel dizzy or have vision changes or ringing in the ears. As with any medicine, there is a very remote chance of a vaccine causing a severe allergic reaction, other serious injury, or death. 5. What if there is a serious problem? An allergic reaction could occur after the vaccinated person leaves the clinic. If you see signs of  a severe allergic reaction (hives, swelling of the face and throat, difficulty breathing, a fast heartbeat, dizziness, or weakness), call 9-1-1 and get  the person to the nearest hospital. For other signs that concern you, call your health care provider. Adverse reactions should be reported to the Vaccine Adverse Event Reporting System (VAERS). Your health care provider will usually file this report, or you can do it yourself. Visit the VAERS website at www.vaers.LAgents.no or call (774)365-3656. VAERS is only for reporting reactions, and VAERS staff members do not give medical advice. 6. The National Vaccine Injury Compensation Program The [employer] Vaccine Injury Compensation Program (VICP) is a federal program that was created to compensate people who may have been injured by certain vaccines. Claims regarding alleged injury or death due to vaccination have a time limit for filing, which may be as short as two years. Visit the VICP website at SpiritualWord.at or call 3187036210 to learn about the program and about filing a claim. 7. How can I learn more? Ask your health care provider. Call your local or state health department. Visit the website of the Food and Drug Administration (FDA) for vaccine package inserts and additional information at FinderList.no. Contact the Centers for Disease Control and Prevention (CDC): Call 787-694-5930 (1-800-CDC-INFO) or Visit CDC's website at BiotechRoom.com.cy. Source: CDC Vaccine Information Statement Inactivated Influenza Vaccine (08/03/2020) This same material is available at FootballExhibition.com.br for no charge. This information is not intended to replace advice given to you by your health care provider. Make sure you discuss any questions you have with your health care provider. Document Revised: 04/01/2023 Document Reviewed: 01/05/2023 Elsevier Patient Education  2024 [employer].

## 2024-09-02 NOTE — Progress Notes (Signed)
 New Patient Office Visit  Subjective    Patient ID: [name], female    DOB: Jul 17, 1951  Age: 73 y.o. MRN: [id number]  CC:  Chief Complaint  Patient presents with   Establish Care    HPI [name] presents to establish care  Discussed the use of AI scribe software for clinical note transcription with the patient, who gave verbal consent to proceed.  History of Present Illness [name] is a 73 year old female with ischemic heart disease and peripheral artery disease who presents for to establish care.  She follows regularly with cardiology and has follow up scheduled in January. She smokes about half a pack of cigarettes per day.  Allergies are managed with Xyzal , a nasal spray, and Singulair . She experiences frequent sinus infections.  Her past medical history includes back surgery for spinal stenosis, gallbladder removal, and a hysterectomy. She is scheduled for rotator cuff surgery and follows with orthopedics for her shoulder.  She denies any history of COPD, high blood pressure, or breathing issues such as chronic cough or shortness of breath. She has had a pneumonia vaccination but not the shingles vaccine, as she has had shingles before. She does not wish to have a mammogram or lung cancer screening but participates in [employer] for colon cancer screening, with last one reportedly 1 year ago. She does not have additional concerns or complaints aside from establishing care.     Outpatient Encounter Medications as of 09/02/2024  Medication Sig   acetaminophen  (TYLENOL ) 325 MG tablet Take 650 mg by mouth every 6 (six) hours as needed for mild pain or moderate pain.   aspirin  EC 81 MG tablet Take 81 mg by mouth daily.   levocetirizine (XYZAL ) 5 MG tablet Take 5 mg by mouth every evening.    metoprolol  succinate (TOPROL -XL) 25 MG 24 hr tablet Take 1 tablet by mouth once daily   montelukast  (SINGULAIR ) 10 MG tablet Take 10 mg by mouth at bedtime.    pantoprazole  (PROTONIX ) 40 MG tablet Take 40 mg by mouth daily before breakfast.    pseudoephedrine (SUDAFED) 60 MG tablet Take 60 mg by mouth every 4 (four) hours as needed for congestion.   rosuvastatin  (CRESTOR ) 40 MG tablet Take 1 tablet by mouth once daily   [DISCONTINUED] azelastine  (ASTELIN ) 0.1 % nasal spray Place 1 spray into both nostrils 2 (two) times daily. Use in each nostril as directed   No facility-administered encounter medications on file as of 09/02/2024.    Past Medical History:  Diagnosis Date   Allergic rhinitis    GERD (gastroesophageal reflux disease)    Heart murmur    History of pneumonia    Hyperlipidemia    PAD (peripheral artery disease) (HCC) 2017   Status post left distal superficial femoral to tibio-peroneal trunk bypass - Dr Gerlean    Past Surgical History:  Procedure Laterality Date   ABDOMINAL AORTOGRAM W/LOWER EXTREMITY N/A 09/23/2017   Procedure: ABDOMINAL AORTOGRAM W/LOWER EXTREMITY;  Surgeon: Sheree Penne Bruckner, MD;  Location: [facility] INVASIVE CV LAB;  Service: Cardiovascular;  Laterality: N/A;   ABDOMINAL HYSTERECTOMY     CHOLECYSTECTOMY     ENDARTERECTOMY FEMORAL Left 04/30/2016   Procedure: Endarterectomy of Left popliteal, anterior tibial and tibio-peroneal trunk;  Surgeon: Lynwood JONETTA Gerlean, MD;  Location: [facility] OR;  Service: Vascular;  Laterality: Left;   FEMORAL-POPLITEAL BYPASS GRAFT Left 04/30/2016   Procedure: Left Distal Superfical Femoral to Tibio-peroneal trunk bypass using non-reversed saphenous vein graph Left  Leg;  Surgeon: Lynwood JONETTA Collum, MD;  Location: [facility] OR;  Service: Vascular;  Laterality: Left;   INTRAOPERATIVE ARTERIOGRAM Left 04/30/2016   Procedure: Left Leg INTRA OPERATIVE ARTERIOGRAM;  Surgeon: Lynwood JONETTA Collum, MD;  Location: [facility] OR;  Service: Vascular;  Laterality: Left;   LUMBAR LAMINECTOMY/DECOMPRESSION MICRODISCECTOMY Left 12/26/2015   Procedure: LUMBAR LAMINECTOMY/DECOMPRESSION MICRODISCECTOMY 1 LEVEL L4-5 ON LEFT ;  Surgeon:  Reyes Billing, MD;  Location: [facility] ORS;  Service: Orthopedics;  Laterality: Left;   OOPHORECTOMY Right    PERIPHERAL VASCULAR CATHETERIZATION N/A 03/25/2016   Procedure: Abdominal Aortogram w/Lower Extremity;  Surgeon: Gaile LELON New, MD;  Location: [facility] INVASIVE CV LAB;  Service: Cardiovascular;  Laterality: N/A;   PERIPHERAL VASCULAR CATHETERIZATION N/A 08/20/2016   Procedure: Abdominal Aortogram w/Lower Extremity;  Surgeon: Penne Lonni Colorado, MD;  Location: [facility] INVASIVE CV LAB;  Service: Cardiovascular;  Laterality: N/A;   PERIPHERAL VASCULAR CATHETERIZATION Left 08/20/2016   Procedure: Peripheral Vascular Balloon Angioplasty;  Surgeon: Penne Lonni [place], MD;  Location: [facility] INVASIVE CV LAB;  Service: Cardiovascular;  Laterality: Left;  Superficial femoral    Family History  Problem Relation Age of Onset   Other Mother        Brain tumor   CAD Father    Heart attack Father     Social History   Socioeconomic History   Marital status: Married    Spouse name: Not on file   Number of children: Not on file   Years of education: Not on file   Highest education level: Not on file  Occupational History   Not on file  Tobacco Use   Smoking status: Every Day    Current packs/day: 0.50    Average packs/day: 0.5 packs/day for 45.0 years (22.5 ttl pk-yrs)    Types: Cigarettes    Passive exposure: Never   Smokeless tobacco: Never   Tobacco comments:    3 per day  Vaping Use   Vaping status: Never Used  Substance and Sexual Activity   Alcohol use: No    Alcohol/week: 0.0 standard drinks of alcohol   Drug use: No   Sexual activity: Not Currently    Birth control/protection: None  Other Topics Concern   Not on file  Social History Narrative   Not on file   Social Drivers of Health   Financial Resource Strain: Not on file  Food Insecurity: Not on file  Transportation Needs: Not on file  Physical Activity: Not on file  Stress: Not on file  Social Connections: Not on file   Intimate Partner Violence: Not on file    Review of Systems  Constitutional:  Negative for chills, fever and malaise/fatigue.  Eyes:  Negative for blurred vision and double vision.  Respiratory:  Negative for cough and shortness of breath.   Cardiovascular:  Negative for chest pain and palpitations.  Neurological:  Negative for dizziness and headaches.  Psychiatric/Behavioral:  Negative for depression. The patient is not nervous/anxious.         Objective    BP 108/67   Pulse 75   Ht 5' 1 (1.549 m)   Wt 142 lb (64.4 kg)   SpO2 97%   BMI 26.83 kg/m   Physical Exam Constitutional:      General: She is not in acute distress.    Appearance: Normal appearance. She is normal weight. She is not ill-appearing.  HENT:     Head: Normocephalic and atraumatic.     Mouth/Throat:     Mouth: Mucous  membranes are moist.     Pharynx: Oropharynx is clear.  Eyes:     Extraocular Movements: Extraocular movements intact.     Conjunctiva/sclera: Conjunctivae normal.  Cardiovascular:     Rate and Rhythm: Normal rate and regular rhythm.     Heart sounds: Normal heart sounds. No murmur heard. Pulmonary:     Effort: Pulmonary effort is normal.     Breath sounds: Normal breath sounds. No wheezing or rales.  Musculoskeletal:     Right lower leg: No edema.     Left lower leg: No edema.  Skin:    General: Skin is warm and dry.  Neurological:     General: No focal deficit present.     Mental Status: She is alert and oriented to person, place, and time.  Psychiatric:        Mood and Affect: Mood normal.        Behavior: Behavior normal.       Assessment & Plan:  Encounter to establish care  Ischemic heart disease Assessment & Plan: Chronic ischemic heart disease with valve calcifications and previous vein replacement surgery. Managed with metoprolol . - Continue metoprolol . - follow up with cardiology as scheduled.    Orders: -     Lipid panel -     CMP14+EGFR -     CBC with  Differential/Platelet  Mixed hyperlipidemia Assessment & Plan: Mixed hyperlipidemia managed with Crestor . Lab work pending due to husband's hospitalization. Cholesterol levels ordered by cardiologist. - Continue Crestor . - Complete lab work at current or hospital lab.  Orders: -     Lipid panel  Seasonal allergic rhinitis due to pollen Assessment & Plan: Allergic rhinitis managed with Xyzal , nasal spray, and Singulair . Frequent sinus infections related to allergies. - Continue Xyzal , nasal spray, and Singulair . - Monitor for sinus infections and follow up as needed.   Need for influenza vaccination -     Flu vaccine HIGH DOSE PF(Fluzone Trivalent)    Return in about 6 months (around 03/02/2025).   Charmaine Anniemae Haberkorn, PA-C

## 2024-09-02 NOTE — Assessment & Plan Note (Signed)
 Mixed hyperlipidemia managed with Crestor . Lab work pending due to husband's hospitalization. Cholesterol levels ordered by cardiologist. - Continue Crestor . - Complete lab work at current or hospital lab.

## 2024-09-02 NOTE — Assessment & Plan Note (Signed)
 Chronic ischemic heart disease with valve calcifications and previous vein replacement surgery. Managed with metoprolol . - Continue metoprolol . - follow up with cardiology as scheduled.

## 2024-09-05 DIAGNOSIS — Z5181 Encounter for therapeutic drug level monitoring: Secondary | ICD-10-CM | POA: Diagnosis not present

## 2024-09-05 DIAGNOSIS — Z79899 Other long term (current) drug therapy: Secondary | ICD-10-CM | POA: Diagnosis not present

## 2024-09-05 DIAGNOSIS — M5416 Radiculopathy, lumbar region: Secondary | ICD-10-CM | POA: Diagnosis not present

## 2024-09-05 DIAGNOSIS — M791 Myalgia, unspecified site: Secondary | ICD-10-CM | POA: Diagnosis not present

## 2024-09-28 DIAGNOSIS — M25512 Pain in left shoulder: Secondary | ICD-10-CM | POA: Diagnosis not present

## 2024-10-21 ENCOUNTER — Other Ambulatory Visit (HOSPITAL_COMMUNITY)
Admission: RE | Admit: 2024-10-21 | Discharge: 2024-10-21 | Disposition: A | Discharge status: Home / Self Care | Source: Ambulatory Visit | Attending: Student | Admitting: Student

## 2024-10-21 ENCOUNTER — Ambulatory Visit: Payer: Self-pay | Admitting: Physician Assistant

## 2024-10-21 ENCOUNTER — Ambulatory Visit: Payer: Self-pay | Admitting: Student

## 2024-10-21 ENCOUNTER — Ambulatory Visit

## 2024-10-21 ENCOUNTER — Other Ambulatory Visit (HOSPITAL_COMMUNITY)
Admission: RE | Admit: 2024-10-21 | Discharge: 2024-10-21 | Disposition: A | Discharge status: Home / Self Care | Source: Ambulatory Visit | Attending: Physician Assistant | Admitting: Physician Assistant

## 2024-10-21 VITALS — Ht 62.0 in | Wt 145.0 lb

## 2024-10-21 DIAGNOSIS — Z0000 Encounter for general adult medical examination without abnormal findings: Secondary | ICD-10-CM | POA: Diagnosis not present

## 2024-10-21 DIAGNOSIS — Z79899 Other long term (current) drug therapy: Secondary | ICD-10-CM | POA: Diagnosis not present

## 2024-10-21 DIAGNOSIS — I259 Chronic ischemic heart disease, unspecified: Secondary | ICD-10-CM | POA: Insufficient documentation

## 2024-10-21 LAB — CBC WITH DIFFERENTIAL/PLATELET
Abs Immature Granulocytes: 0.03 K/uL (ref 0.00–0.07)
Basophils Absolute: 0 K/uL (ref 0.0–0.1)
Basophils Relative: 0 %
Eosinophils Absolute: 0.1 K/uL (ref 0.0–0.5)
Eosinophils Relative: 1 %
HCT: 40.3 % (ref 36.0–46.0)
Hemoglobin: 13.1 g/dL (ref 12.0–15.0)
Immature Granulocytes: 0 %
Lymphocytes Relative: 30 %
Lymphs Abs: 2.3 K/uL (ref 0.7–4.0)
MCH: 29.9 pg (ref 26.0–34.0)
MCHC: 32.5 g/dL (ref 30.0–36.0)
MCV: 92 fL (ref 80.0–100.0)
Monocytes Absolute: 0.6 K/uL (ref 0.1–1.0)
Monocytes Relative: 7 %
Neutro Abs: 4.6 K/uL (ref 1.7–7.7)
Neutrophils Relative %: 62 %
Platelets: 257 K/uL (ref 150–400)
RBC: 4.38 MIL/uL (ref 3.87–5.11)
RDW: 12.5 % (ref 11.5–15.5)
WBC: 7.6 K/uL (ref 4.0–10.5)
nRBC: 0 % (ref 0.0–0.2)

## 2024-10-21 LAB — COMPREHENSIVE METABOLIC PANEL WITH GFR
ALT: 16 U/L (ref 0–44)
AST: 23 U/L (ref 15–41)
Albumin: 4.4 g/dL (ref 3.5–5.0)
Alkaline Phosphatase: 60 U/L (ref 38–126)
Anion gap: 11 (ref 5–15)
BUN: 14 mg/dL (ref 8–23)
CO2: 26 mmol/L (ref 22–32)
Calcium: 9.4 mg/dL (ref 8.9–10.3)
Chloride: 104 mmol/L (ref 98–111)
Creatinine, Ser: 0.64 mg/dL (ref 0.44–1.00)
GFR, Estimated: >60 mL/min (ref >60)
Glucose, Bld: 101 mg/dL — ABNORMAL HIGH (ref 70–99)
Potassium: 4.6 mmol/L (ref 3.5–5.1)
Sodium: 141 mmol/L (ref 135–145)
Total Bilirubin: 0.3 mg/dL (ref 0.0–1.2)
Total Protein: 6.8 g/dL (ref 6.5–8.1)

## 2024-10-21 LAB — LIPID PANEL
Cholesterol: 157 mg/dL (ref 0–200)
HDL: 57 mg/dL (ref >40)
LDL Cholesterol: 87 mg/dL (ref 0–99)
Total CHOL/HDL Ratio: 2.8 ratio
Triglycerides: 68 mg/dL (ref <150)
VLDL: 14 mg/dL (ref 0–40)

## 2024-10-21 NOTE — Progress Notes (Signed)
 Subjective:   [name] is a 73 y.o. who presents for a Medicare Wellness preventive visit.  As a reminder, Annual Wellness Visits don't include a physical exam, and some assessments may be limited, especially if this visit is performed virtually. We may recommend an in-person follow-up visit with your provider if needed.  Visit Complete: Virtual I connected with  Mikylah A Akhavan on 10/21/24 by a audio enabled telemedicine application and verified that I am speaking with the correct person using two identifiers.  Patient Location: Home  Provider Location: Home Office  I discussed the limitations of evaluation and management by telemedicine. The patient expressed understanding and agreed to proceed.  Vital Signs: Because this visit was a virtual/telehealth visit, some criteria may be missing or patient reported. Any vitals not documented were not able to be obtained and vitals that have been documented are patient reported.  VideoError- Librarian, academic were attempted between this provider and patient, however failed, due to patient having technical difficulties OR patient did not have access to video capability.  We continued and completed visit with audio only.   Persons Participating in Visit: Patient.  AWV Questionnaire: Yes: Patient Medicare AWV questionnaire was completed by the patient on 10/21/2024; I have confirmed that all information answered by patient is correct and no changes since this date.  Cardiac Risk Factors include: advanced age (>71men, >58 women);dyslipidemia     Objective:    Today's Vitals   10/21/24 1130  Weight: 145 lb (65.8 kg)  Height: 5' 2 (1.575 m)  PainSc: 0-No pain   Body mass index is 26.52 kg/m.     10/21/2024   11:30 AM 09/23/2017    7:26 AM 07/24/2017    2:15 PM 12/26/2016   10:05 AM 08/20/2016    7:13 AM 08/15/2016    9:09 AM 04/30/2016   10:00 PM  Advanced Directives  Does Patient Have a Medical  Advance Directive? No Yes  No  Yes  No  Yes  Yes   Type of Furniture conservator/restorer;Living will  Living will  Healthcare Power of [employer] Power of [place];Living will   Does patient want to make changes to medical advance directive?  No - Patient declined        Copy of Healthcare Power of Attorney in Chart?  No - copy requested    No - copy requested  No - copy requested    Would patient like information on creating a medical advance directive? No - Patient declined  No - Patient declined   No - patient declined information        Data saved with a previous flowsheet row definition    Current Medications (verified) Outpatient Encounter Medications as of 10/21/2024  Medication Sig   acetaminophen  (TYLENOL ) 325 MG tablet Take 650 mg by mouth every 6 (six) hours as needed for mild pain or moderate pain.   aspirin  EC 81 MG tablet Take 81 mg by mouth daily.   levocetirizine (XYZAL ) 5 MG tablet Take 5 mg by mouth every evening.    metoprolol  succinate (TOPROL -XL) 25 MG 24 hr tablet Take 1 tablet by mouth once daily   montelukast  (SINGULAIR ) 10 MG tablet Take 10 mg by mouth at bedtime.   pantoprazole  (PROTONIX ) 40 MG tablet Take 40 mg by mouth daily before breakfast.    pseudoephedrine (SUDAFED) 60 MG tablet Take 60 mg by mouth every 4 (four) hours as needed for congestion.  rosuvastatin  (CRESTOR ) 40 MG tablet Take 1 tablet by mouth once daily   No facility-administered encounter medications on file as of 10/21/2024.    Allergies (verified) Dust mite extract, Grass pollen([id number]-o-[id number]-swt vern), Mixed grasses, Red dye #40 (allura red), and Zetia  [ezetimibe ]   History: Past Medical History:  Diagnosis Date   Allergic rhinitis    GERD (gastroesophageal reflux disease)    Heart murmur    History of pneumonia    Hyperlipidemia    PAD (peripheral artery disease) 2017   Status post left distal superficial femoral to tibio-peroneal trunk bypass - Dr Gerlean    Past Surgical History:  Procedure Laterality Date   ABDOMINAL AORTOGRAM W/LOWER EXTREMITY N/A 09/23/2017   Procedure: ABDOMINAL AORTOGRAM W/LOWER EXTREMITY;  Surgeon: Sheree Penne Bruckner, MD;  Location: [facility] INVASIVE CV LAB;  Service: Cardiovascular;  Laterality: N/A;   ABDOMINAL HYSTERECTOMY     CHOLECYSTECTOMY     ENDARTERECTOMY FEMORAL Left 04/30/2016   Procedure: Endarterectomy of Left popliteal, anterior tibial and tibio-peroneal trunk;  Surgeon: Lynwood JONETTA Gerlean, MD;  Location: [facility] OR;  Service: Vascular;  Laterality: Left;   FEMORAL-POPLITEAL BYPASS GRAFT Left 04/30/2016   Procedure: Left Distal Superfical Femoral to Tibio-peroneal trunk bypass using non-reversed saphenous vein graph Left Leg;  Surgeon: Lynwood JONETTA Gerlean, MD;  Location: [facility] OR;  Service: Vascular;  Laterality: Left;   INTRAOPERATIVE ARTERIOGRAM Left 04/30/2016   Procedure: Left Leg INTRA OPERATIVE ARTERIOGRAM;  Surgeon: Lynwood JONETTA Gerlean, MD;  Location: [facility] OR;  Service: Vascular;  Laterality: Left;   LUMBAR LAMINECTOMY/DECOMPRESSION MICRODISCECTOMY Left 12/26/2015   Procedure: LUMBAR LAMINECTOMY/DECOMPRESSION MICRODISCECTOMY 1 LEVEL L4-5 ON LEFT ;  Surgeon: Reyes Billing, MD;  Location: [facility] ORS;  Service: Orthopedics;  Laterality: Left;   OOPHORECTOMY Right    PERIPHERAL VASCULAR CATHETERIZATION N/A 03/25/2016   Procedure: Abdominal Aortogram w/Lower Extremity;  Surgeon: Gaile LELON New, MD;  Location: [facility] INVASIVE CV LAB;  Service: Cardiovascular;  Laterality: N/A;   PERIPHERAL VASCULAR CATHETERIZATION N/A 08/20/2016   Procedure: Abdominal Aortogram w/Lower Extremity;  Surgeon: Penne Bruckner Sheree, MD;  Location: [facility] INVASIVE CV LAB;  Service: Cardiovascular;  Laterality: N/A;   PERIPHERAL VASCULAR CATHETERIZATION Left 08/20/2016   Procedure: Peripheral Vascular Balloon Angioplasty;  Surgeon: Penne Bruckner Sheree, MD;  Location: [facility] INVASIVE CV LAB;  Service: Cardiovascular;  Laterality: Left;  Superficial femoral   Family  History  Problem Relation Age of Onset   Other Mother        Brain tumor   CAD Father    Heart attack Father    Social History   Socioeconomic History   Marital status: Married    Spouse name: Not on file   Number of children: Not on file   Years of education: Not on file   Highest education level: Not on file  Occupational History   Not on file  Tobacco Use   Smoking status: Every Day    Current packs/day: 0.50    Average packs/day: 0.5 packs/day for 45.0 years (22.5 ttl pk-yrs)    Types: Cigarettes    Passive exposure: Never   Smokeless tobacco: Never   Tobacco comments:    3 per day  Vaping Use   Vaping status: Never Used  Substance and Sexual Activity   Alcohol use: No    Alcohol/week: 0.0 standard drinks of alcohol   Drug use: No   Sexual activity: Not Currently    Birth control/protection: None  Other Topics Concern   Not on file  Social History Narrative  Not on file   Social Drivers of Health   Financial Resource Strain: Low Risk  (10/21/2024)   Overall Financial Resource Strain (CARDIA)    Difficulty of Paying Living Expenses: Not hard at all  Food Insecurity: No Food Insecurity (10/21/2024)   Hunger Vital Sign    Worried About Running Out of Food in the Last Year: Never true    Ran Out of Food in the Last Year: Never true  Transportation Needs: No Transportation Needs (10/21/2024)   PRAPARE - Administrator, Civil Service (Medical): No    Lack of Transportation (Non-Medical): No  Physical Activity: Sufficiently Active (10/21/2024)   Exercise Vital Sign    Days of Exercise per Week: 7 days    Minutes of Exercise per Session: 30 min  Stress: No Stress Concern Present (10/21/2024)   [employer] of Occupational Health - Occupational Stress Questionnaire    Feeling of Stress: Not at all  Social Connections: Socially Integrated (10/21/2024)   Social Connection and Isolation Panel    Frequency of Communication with Friends and Family:  More than three times a week    Frequency of Social Gatherings with Friends and Family: More than three times a week    Attends Religious Services: More than 4 times per year    Active Member of [employer] or Organizations: Yes    Attends Engineer, structural: More than 4 times per year    Marital Status: Married    Tobacco Counseling Ready to quit: No Counseling given: Yes Tobacco comments: 3 per day    Clinical Intake:  Pre-visit preparation completed: Yes  Pain : No/denies pain Pain Score: 0-No pain     BMI - recorded: 26.52 Nutritional Status: BMI 25 -29 Overweight Nutritional Risks: None Diabetes: No  No results found for: HGBA1C   How often do you need to have someone help you when you read instructions, pamphlets, or other written materials from your doctor or pharmacy?: 1 - Never  Interpreter Needed?: No  Information entered by :: Ulysse Siemen W CMA (AAMA)   Activities of Daily Living     10/21/2024   11:37 AM  In your present state of health, do you have any difficulty performing the following activities:  Hearing? 0  Vision? 0  Difficulty concentrating or making decisions? 0  Walking or climbing stairs? 0  Dressing or bathing? 0  Doing errands, shopping? 0  Preparing Food and eating ? N  Using the Toilet? N  In the past six months, have you accidently leaked urine? N  Do you have problems with loss of bowel control? N  Managing your Medications? N  Managing your Finances? N  Housekeeping or managing your Housekeeping? N    Patient Care Team: [place], [place], [place] as PCP - General (Physician Assistant) Debera Jayson MATSU, MD as PCP - Cardiology (Cardiology) Johnson Laymon CHRISTELLA RIGGERS as Physician Assistant (Cardiology) Debera Jayson MATSU, MD as Consulting Physician (Cardiology) Faye Lauraine PARAS, FNP (Orthopedic Surgery) Burnetta Aures, MD as Consulting Physician (Orthopedic Surgery) Melita Drivers, MD as Consulting Physician (Orthopedic  Surgery) Dr [facility], OD (Optometry) Bonner Ade, MD as Consulting Physician (Physical Medicine and Rehabilitation)  I have updated your Care Teams any recent Medical Services you may have received from other providers in the past year.     Assessment:   This is a routine wellness examination for Tierrah.  Hearing/Vision screen Hearing Screening - Comments:: Patient denies any hearing difficulties.   Vision Screening -  Comments:: Wears rx glasses - up to date with routine eye exams with  Willma Moats   Goals Addressed               This Visit's Progress     remain active and healthy (pt-stated)        Patient is husbands caregiver        Depression Screen     10/21/2024    3:30 PM 06/05/2011   10:25 AM  PHQ 2/9 Scores  PHQ - 2 Score 0 0   PHQ- 9 Score 0      Data saved with a previous flowsheet row definition     Fall Risk     10/21/2024   11:00 AM  Fall Risk   Falls in the past year? 0  Number falls in past yr: 0  Injury with Fall? 0  Risk for fall due to : No Fall Risks  Follow up Falls evaluation completed;Education provided;Falls prevention discussed    MEDICARE RISK AT HOME:  Medicare Risk at Home Any stairs in or around the home?: No If so, are there any without handrails?: No Home free of loose throw rugs in walkways, pet beds, electrical cords, etc?: Yes Adequate lighting in your home to reduce risk of falls?: Yes Life alert?: No Use of a cane, walker or w/c?: No Grab bars in the bathroom?: Yes Shower chair or bench in shower?: Yes Elevated toilet seat or a handicapped toilet?: Yes  TIMED UP AND GO:  Was the test performed?  No  Cognitive Function: 6CIT completed        10/21/2024   11:00 AM  6CIT Screen  What Year? 0 points  What month? 0 points  What time? 0 points  Count back from 20 0 points  Months in reverse 0 points  Repeat phrase 0 points  Total Score 0 points    Immunizations Immunization  History  Administered Date(s) Administered   Fluzone Influenza virus vaccine,trivalent (IIV3), split virus 12/19/2011   INFLUENZA, HIGH DOSE SEASONAL PF 09/02/2024   Pneumococcal Conjugate-13 02/13/2016    Screening Tests Health Maintenance  Topic Date Due   COVID-19 Vaccine (1) Never done   Hepatitis C Screening  Never done   DTaP/Tdap/Td (1 - Tdap) Never done   Colonoscopy  Never done   Pneumococcal Vaccine: 50+ Years (2 of 2 - PPSV23, PCV20, or PCV21) 04/09/2016   Zoster Vaccines- Shingrix (1 of 2) 12/02/2024 (Originally 08/23/1970)   Lung Cancer Screening  09/02/2025 (Originally 08/23/2000)   Mammogram  09/02/2025 (Originally 08/23/1990)   Medicare Annual Wellness (AWV)  10/21/2025   Influenza Vaccine  Completed   DEXA SCAN  Completed   Meningococcal B Vaccine  Aged Out    Health Maintenance Health Maintenance Due  Topic Date Due   COVID-19 Vaccine (1) Never done   Hepatitis C Screening  Never done   DTaP/Tdap/Td (1 - Tdap) Never done   Colonoscopy  Never done   Pneumococcal Vaccine: 50+ Years (2 of 2 - PPSV23, PCV20, or PCV21) 04/09/2016   Health Maintenance Items Addressed: Patient is aware of recommended screenings and vaccines  Additional Screening:  Vision Screening: Recommended annual ophthalmology exams for early detection of glaucoma and other disorders of the eye. Would you like a referral to an eye doctor? No    Dental Screening: Recommended annual dental exams for proper oral hygiene  Community Resource Referral / Chronic Care Management: CRR required this visit?  No   CCM required this  visit?  No   Plan:    I have personally reviewed and noted the following in the patient's chart:   Medical and social history Use of alcohol, tobacco or illicit drugs  Current medications and supplements including opioid prescriptions. Patient is not currently taking opioid prescriptions. Functional ability and status Nutritional status Physical activity Advanced  directives List of other physicians Hospitalizations, surgeries, and ER visits in previous 12 months Vitals Screenings to include cognitive, depression, and falls Referrals and appointments  In addition, I have reviewed and discussed with patient certain preventive protocols, quality metrics, and best practice recommendations. A written personalized care plan for preventive services as well as general preventive health recommendations were provided to patient.   Kemyah Buser, CMA   10/21/2024   After Visit Summary: (MyChart) Due to this being a telephonic visit, the after visit summary with patients personalized plan was offered to patient via MyChart   Notes: Nothing significant to report at this time.

## 2024-10-21 NOTE — Patient Instructions (Signed)
 [name],  Thank you for taking the time for your Medicare Wellness Visit. I appreciate your continued commitment to your health goals. Please review the care plan we discussed, and feel free to reach out if I can assist you further.  Medicare recommends these wellness visits once per year to help you and your care team stay ahead of potential health issues. These visits are designed to focus on prevention, allowing your provider to concentrate on managing your acute and chronic conditions during your regular appointments.  Please note that Annual Wellness Visits do not include a physical exam. Some assessments may be limited, especially if the visit was conducted virtually. If needed, we may recommend a separate in-person follow-up with your provider.  Wishing you excellent health and many blessings in the year to come!  -Rosselyn Martha, CMA  Ongoing Care Seeing your primary care provider every 3 to 6 months helps us  monitor your health and provide consistent, personalized care.   Recommended Screenings:  Health Maintenance  Topic Date Due   COVID-19 Vaccine (1) Never done   Hepatitis C Screening  Never done   DTaP/Tdap/Td vaccine (1 - Tdap) Never done   Colon Cancer Screening  Never done   Pneumococcal Vaccine for age over 47 (2 of 2 - PPSV23, PCV20, or PCV21) 04/09/2016   Zoster (Shingles) Vaccine (1 of 2) 12/02/2024*   Screening for Lung Cancer  09/02/2025*   Breast Cancer Screening  09/02/2025*   Medicare Annual Wellness Visit  10/21/2025   Flu Shot  Completed   DEXA scan (bone density measurement)  Completed   Meningitis B Vaccine  Aged Out  *Topic was postponed. The date shown is not the original due date.       10/21/2024   11:30 AM  Advanced Directives  Does Patient Have a Medical Advance Directive? No  Would patient like information on creating a medical advance directive? No - Patient declined   Advance Care Planning is important because it: Ensures you receive medical  care that aligns with your values, goals, and preferences. Provides guidance to your family and loved ones, reducing the emotional burden of decision-making during critical moments.  Vision: Annual vision screenings are recommended for early detection of glaucoma, cataracts, and diabetic retinopathy. These exams can also reveal signs of chronic conditions such as diabetes and high blood pressure.  Dental: Annual dental screenings help detect early signs of oral cancer, gum disease, and other conditions linked to overall health, including heart disease and diabetes.  Please see the attached documents for additional preventive care recommendations.

## 2024-12-04 ENCOUNTER — Other Ambulatory Visit: Payer: Self-pay | Admitting: Cardiology

## 2024-12-24 ENCOUNTER — Ambulatory Visit
Admission: EM | Admit: 2024-12-24 | Discharge: 2024-12-24 | Disposition: A | Discharge status: Home / Self Care | Attending: Family Medicine | Admitting: Family Medicine

## 2024-12-24 ENCOUNTER — Encounter: Payer: Self-pay | Admitting: Emergency Medicine

## 2024-12-24 ENCOUNTER — Other Ambulatory Visit: Payer: Self-pay

## 2024-12-24 DIAGNOSIS — J3089 Other allergic rhinitis: Secondary | ICD-10-CM | POA: Diagnosis not present

## 2024-12-24 DIAGNOSIS — I959 Hypotension, unspecified: Secondary | ICD-10-CM

## 2024-12-24 DIAGNOSIS — J0100 Acute maxillary sinusitis, unspecified: Secondary | ICD-10-CM | POA: Diagnosis not present

## 2024-12-24 LAB — GLUCOSE, POCT (MANUAL RESULT ENTRY): POC Glucose: 108 mg/dL — AB (ref 70–99)

## 2024-12-24 MED ORDER — DOXYCYCLINE HYCLATE 100 MG PO CAPS: 100.0000 mg | ORAL_CAPSULE | Freq: Two times a day (BID) | ORAL | 0 refills | Status: AC

## 2024-12-24 NOTE — ED Provider Notes (Signed)
 " [facility]    CSN: [id number] Arrival date & time: 12/24/24  0805      History   Chief Complaint Chief Complaint  Patient presents with   Nasal Congestion    HPI [name] is a 73 y.o. female.   Pt reports nasal congestion, facial pressure x2 weeks. Pt has been taking otc medication with no change in symptoms.       Past Medical History:  Diagnosis Date   Allergic rhinitis    GERD (gastroesophageal reflux disease)    Heart murmur    History of pneumonia    Hyperlipidemia    PAD (peripheral artery disease) 2017   Status post left distal superficial femoral to tibio-peroneal trunk bypass - Dr Gerlean    Patient Active Problem List   Diagnosis Date Noted   Ischemic heart disease 09/02/2024   HLD (hyperlipidemia) 09/02/2024   Nontraumatic rotator cuff tear 07/27/2024   Cervicalgia 04/03/2023   Low back pain 06/15/2018   PAD (peripheral artery disease) 03/11/2016   Spinal stenosis of lumbar region 12/26/2015   Allergic rhinitis 12/12/2011   History of gastroesophageal reflux (GERD) 12/12/2011   Tobacco use 12/12/2011    Past Surgical History:  Procedure Laterality Date   ABDOMINAL AORTOGRAM W/LOWER EXTREMITY N/A 09/23/2017   Procedure: ABDOMINAL AORTOGRAM W/LOWER EXTREMITY;  Surgeon: Sheree Penne Bruckner, MD;  Location: [facility] INVASIVE CV LAB;  Service: Cardiovascular;  Laterality: N/A;   ABDOMINAL HYSTERECTOMY     CHOLECYSTECTOMY     ENDARTERECTOMY FEMORAL Left 04/30/2016   Procedure: Endarterectomy of Left popliteal, anterior tibial and tibio-peroneal trunk;  Surgeon: Lynwood JONETTA Gerlean, MD;  Location: [facility] OR;  Service: Vascular;  Laterality: Left;   FEMORAL-POPLITEAL BYPASS GRAFT Left 04/30/2016   Procedure: Left Distal Superfical Femoral to Tibio-peroneal trunk bypass using non-reversed saphenous vein graph Left Leg;  Surgeon: Lynwood JONETTA Gerlean, MD;  Location: [facility] OR;  Service: Vascular;  Laterality: Left;   INTRAOPERATIVE ARTERIOGRAM Left 04/30/2016    Procedure: Left Leg INTRA OPERATIVE ARTERIOGRAM;  Surgeon: Lynwood JONETTA Gerlean, MD;  Location: [facility] OR;  Service: Vascular;  Laterality: Left;   LUMBAR LAMINECTOMY/DECOMPRESSION MICRODISCECTOMY Left 12/26/2015   Procedure: LUMBAR LAMINECTOMY/DECOMPRESSION MICRODISCECTOMY 1 LEVEL L4-5 ON LEFT ;  Surgeon: Reyes Billing, MD;  Location: [facility] ORS;  Service: Orthopedics;  Laterality: Left;   OOPHORECTOMY Right    PERIPHERAL VASCULAR CATHETERIZATION N/A 03/25/2016   Procedure: Abdominal Aortogram w/Lower Extremity;  Surgeon: Gaile LELON New, MD;  Location: [facility] INVASIVE CV LAB;  Service: Cardiovascular;  Laterality: N/A;   PERIPHERAL VASCULAR CATHETERIZATION N/A 08/20/2016   Procedure: Abdominal Aortogram w/Lower Extremity;  Surgeon: Penne Bruckner Sheree, MD;  Location: [facility] INVASIVE CV LAB;  Service: Cardiovascular;  Laterality: N/A;   PERIPHERAL VASCULAR CATHETERIZATION Left 08/20/2016   Procedure: Peripheral Vascular Balloon Angioplasty;  Surgeon: Penne Bruckner Sheree, MD;  Location: [facility] INVASIVE CV LAB;  Service: Cardiovascular;  Laterality: Left;  Superficial femoral    OB History     Gravida  2   Para      Term      Preterm      AB      Living         SAB      IAB      Ectopic      Multiple      Live Births               Home Medications    Prior to Admission medications  Medication Sig  Start Date End Date Taking? Authorizing Provider  doxycycline  (VIBRAMYCIN ) 100 MG capsule Take 1 capsule (100 mg total) by mouth 2 (two) times daily. 12/24/24  Yes Stuart Vernell Norris, PA-C  acetaminophen  (TYLENOL ) 325 MG tablet Take 650 mg by mouth every 6 (six) hours as needed for mild pain or moderate pain.    [provider]  aspirin  EC 81 MG tablet Take 81 mg by mouth daily.    [provider]  levocetirizine (XYZAL ) 5 MG tablet Take 5 mg by mouth every evening.     [provider]  metoprolol  succinate (TOPROL -XL) 25 MG 24 hr tablet Take 1 tablet by mouth  once daily 12/08/24   Debera Jayson MATSU, MD  montelukast  (SINGULAIR ) 10 MG tablet Take 10 mg by mouth at bedtime.    [provider]  pantoprazole  (PROTONIX ) 40 MG tablet Take 40 mg by mouth daily before breakfast.     [provider]  pseudoephedrine (SUDAFED) 60 MG tablet Take 60 mg by mouth every 4 (four) hours as needed for congestion.    [provider]  rosuvastatin  (CRESTOR ) 40 MG tablet Take 1 tablet by mouth once daily 03/17/24   Debera Jayson MATSU, MD    Family History Family History  Problem Relation Age of Onset   Other Mother        Brain tumor   CAD Father    Heart attack Father     Social History Social History[1]   Allergies   Dust mite extract, Grass pollen([id number]-swt vern), Mixed grasses, Red dye #40 (allura red), and Zetia  [ezetimibe ]   Review of Systems Review of Systems Per HPI  Physical Exam Triage Vital Signs ED Triage Vitals  Encounter Vitals Group     BP 12/24/24 0833 (!) 91/54     Girls Systolic BP Percentile --      Girls Diastolic BP Percentile --      Boys Systolic BP Percentile --      Boys Diastolic BP Percentile --      Pulse Rate 12/24/24 0833 67     Resp 12/24/24 0833 20     Temp 12/24/24 0833 98.2 F (36.8 C)     Temp Source 12/24/24 0833 Oral     SpO2 12/24/24 0833 96 %     Weight --      Height --      Head Circumference --      Peak Flow --      Pain Score 12/24/24 0832 8     Pain Loc --      Pain Education --      Exclude from Growth Chart --    Orthostatic VS for the past 24 hrs:  BP- Lying Pulse- Lying BP- Sitting Pulse- Sitting BP- Standing at 0 minutes Pulse- Standing at 0 minutes  12/24/24 0852 125/83 67 129/83 67 133/82 67    Updated Vital Signs BP (!) 91/54 (BP Location: Right Arm)   Pulse 67   Temp 98.2 F (36.8 C) (Oral)   Resp 20   SpO2 96%   Visual Acuity Right Eye Distance:   Left Eye Distance:   Bilateral Distance:    Right Eye Near:   Left Eye Near:    Bilateral  Near:     Physical Exam Vitals and nursing note reviewed.  Constitutional:      Appearance: Normal appearance.  HENT:     Head: Atraumatic.     Right Ear: Tympanic membrane and external ear normal.  Left Ear: Tympanic membrane and external ear normal.     Nose: Congestion present.     Mouth/Throat:     Mouth: Mucous membranes are moist.     Pharynx: Posterior oropharyngeal erythema present.  Eyes:     Extraocular Movements: Extraocular movements intact.     Conjunctiva/sclera: Conjunctivae normal.  Cardiovascular:     Rate and Rhythm: Normal rate and regular rhythm.     Heart sounds: Normal heart sounds.  Pulmonary:     Effort: Pulmonary effort is normal.     Breath sounds: Normal breath sounds. No wheezing or rales.  Musculoskeletal:        General: Normal range of motion.     Cervical back: Normal range of motion and neck supple.  Skin:    General: Skin is warm and dry.  Neurological:     Mental Status: She is alert and oriented to person, place, and time.  Psychiatric:        Mood and Affect: Mood normal.        Thought Content: Thought content normal.    UC Treatments / Results  Labs (all labs ordered are listed, but only abnormal results are displayed) Labs Reviewed  GLUCOSE, POCT (MANUAL RESULT ENTRY) - Abnormal; Notable for the following components:      Result Value   POC Glucose 108 (*)    All other components within normal limits    EKG   Radiology No results found.  Procedures Procedures (including critical care time)  Medications Ordered in UC Medications - No data to display  Initial Impression / Assessment and Plan / UC Course  I have reviewed the triage vital signs and the nursing notes.  Pertinent labs & imaging results that were available during my care of the patient were reviewed by me and considered in my medical decision making (see chart for details).     *** Final Clinical Impressions(s) / UC Diagnoses   Final diagnoses:   Hypotension, unspecified hypotension type  Acute maxillary sinusitis, recurrence not specified  Seasonal allergic rhinitis due to other allergic trigger   Discharge Instructions   None    ED Prescriptions     Medication Sig Dispense Auth. Provider   doxycycline  (VIBRAMYCIN ) 100 MG capsule Take 1 capsule (100 mg total) by mouth 2 (two) times daily. 20 capsule Stuart Vernell Norris, [place]      PDMP not reviewed this encounter.    [1]  Social History Tobacco Use   Smoking status: Every Day    Current packs/day: 0.50    Average packs/day: 0.5 packs/day for 45.0 years (22.5 ttl pk-yrs)    Types: Cigarettes    Passive exposure: Never   Smokeless tobacco: Never   Tobacco comments:    3 per day  Vaping Use   Vaping status: Never Used  Substance Use Topics   Alcohol use: No    Alcohol/week: 0.0 standard drinks of alcohol   Drug use: No   "

## 2024-12-24 NOTE — ED Notes (Signed)
 Pt last ate at 9pm last night.

## 2024-12-24 NOTE — ED Triage Notes (Signed)
 Pt reports nasal congestion, facial pressure x2 weeks. Pt has been taking otc medication with no change in symptoms.

## 2025-01-10 ENCOUNTER — Telehealth: Payer: Self-pay

## 2025-01-10 NOTE — Telephone Encounter (Signed)
 Error

## 2025-01-16 ENCOUNTER — Telehealth: Payer: Self-pay

## 2025-01-16 ENCOUNTER — Telehealth: Payer: Self-pay | Admitting: Physician Assistant

## 2025-01-16 ENCOUNTER — Other Ambulatory Visit: Payer: Self-pay

## 2025-01-16 MED ORDER — LEVOCETIRIZINE DIHYDROCHLORIDE 5 MG PO TABS: 5.0000 mg | ORAL_TABLET | Freq: Every evening | ORAL | 0 refills | Status: AC

## 2025-01-16 MED ORDER — MONTELUKAST SODIUM 10 MG PO TABS: 10.0000 mg | ORAL_TABLET | Freq: Every day | ORAL | 0 refills | Status: AC

## 2025-01-16 MED ORDER — LEVOCETIRIZINE DIHYDROCHLORIDE 5 MG PO TABS: 5.0000 mg | ORAL_TABLET | Freq: Every evening | ORAL | 0 refills | Status: DC

## 2025-01-16 MED ORDER — MONTELUKAST SODIUM 10 MG PO TABS: 10.0000 mg | ORAL_TABLET | Freq: Every day | ORAL | 0 refills | Status: DC

## 2025-01-16 NOTE — Telephone Encounter (Unsigned)
 Copied from CRM [id number]. Topic: Clinical - Medication Refill >> Jan 16, 2025 12:32 PM Ivette P wrote: Medication:  Member wants 90 day supplyu  levocetirizine (XYZAL ) 5 MG tablet  montelukast  (SINGULAIR ) 10 MG tablet    Has the patient contacted their pharmacy? Yes (Agent: If no, request that the patient contact the pharmacy for the refill. If patient does not wish to contact the pharmacy document the reason why and proceed with request.) (Agent: If yes, when and what did the pharmacy advise?)  This is the patient's preferred pharmacy:  [facility] [address] [place] - [address] [place] [address] Phone: [phone] Fax: [phone]  Is this the correct pharmacy for this prescription? Yes If no, delete pharmacy and type the correct one.   Has the prescription been filled recently? No  Is the patient out of the medication? Yes  Has the patient been seen for an appointment in the last year OR does the patient have an upcoming appointment? Yes, 03/02/2025  Can we respond through MyChart? No  Agent: Please be advised that Rx refills may take up to 3 business days. We ask that you follow-up with your pharmacy.

## 2025-01-16 NOTE — Telephone Encounter (Signed)
 Refills have been sent it

## 2025-03-02 ENCOUNTER — Ambulatory Visit: Admitting: Physician Assistant

## 2025-10-27 ENCOUNTER — Ambulatory Visit
